# Patient Record
Sex: Female | Born: 1980
Health system: Southern US, Community
[De-identification: ages and names within clinical notes are randomized; demographics above are authoritative.]

## PROBLEM LIST (undated history)

## (undated) DIAGNOSIS — R7303 Prediabetes: Secondary | ICD-10-CM

## (undated) DIAGNOSIS — K37 Unspecified appendicitis: Secondary | ICD-10-CM

## (undated) DIAGNOSIS — Z789 Other specified health status: Secondary | ICD-10-CM

## (undated) DIAGNOSIS — E785 Hyperlipidemia, unspecified: Secondary | ICD-10-CM

## (undated) HISTORY — PX: OTHER SURGICAL HISTORY: SHX169

## (undated) HISTORY — DX: Hyperlipidemia, unspecified: E78.5

## (undated) HISTORY — DX: Prediabetes: R73.03

## (undated) HISTORY — DX: Other specified health status: Z78.9

---

## 2013-03-14 ENCOUNTER — Emergency Department (INDEPENDENT_AMBULATORY_CARE_PROVIDER_SITE_OTHER)
Admission: EM | Admit: 2013-03-14 | Discharge: 2013-03-14 | Disposition: A | Discharge status: Home / Self Care | Payer: No Typology Code available for payment source | Source: Home / Self Care | Attending: Family Medicine | Admitting: Family Medicine

## 2013-03-14 ENCOUNTER — Encounter (HOSPITAL_COMMUNITY): Payer: Self-pay | Admitting: Emergency Medicine

## 2013-03-14 DIAGNOSIS — K219 Gastro-esophageal reflux disease without esophagitis: Secondary | ICD-10-CM

## 2013-03-14 LAB — POCT RAPID STREP A: Streptococcus, Group A Screen (Direct): NEGATIVE

## 2013-03-14 MED ORDER — GI COCKTAIL ~~LOC~~
ORAL | Status: AC
  Filled 2013-03-14: qty 30

## 2013-03-14 MED ORDER — RANITIDINE HCL 150 MG PO TABS: 150.0000 mg | ORAL_TABLET | Freq: Two times a day (BID) | ORAL | Status: DC

## 2013-03-14 MED ORDER — GI COCKTAIL ~~LOC~~
30.0000 mL | Freq: Once | ORAL | Status: AC
  Administered 2013-03-14: 30 mL via ORAL

## 2013-03-14 NOTE — ED Notes (Signed)
Pt c/o ST onset 4 days... sxs include odynophagia... Denies: fevers and cold sxs... She is alert w/no signs of acute distress.

## 2013-03-14 NOTE — ED Provider Notes (Signed)
   History    CSN: [id number] Arrival date & time 03/14/13  1551  First MD Initiated Contact with Patient 03/14/13 1615     Chief Complaint  Patient presents with  . Sore Throat   (Consider location/radiation/quality/duration/timing/severity/associated sxs/prior Treatment) Patient is a 32 y.o. female presenting with pharyngitis. The history is provided by the patient and a friend. The history is limited by a language barrier. Language interpreter used: friend translated.  Sore Throat This is a new problem. The current episode started more than 2 days ago. The problem has been gradually worsening. Pertinent negatives include no chest pain and no abdominal pain. The symptoms are aggravated by swallowing.   History reviewed. No pertinent past medical history. History reviewed. No pertinent past surgical history. No family history on file. History  Substance Use Topics  . Smoking status: Never Smoker   . Smokeless tobacco: Not on file  . Alcohol Use: No   OB History   Grav Para Term Preterm Abortions TAB SAB Ect Mult Living                 Review of Systems  Constitutional: Negative.  Negative for fever.  HENT: Negative.   Respiratory: Negative.   Cardiovascular: Negative for chest pain.  Gastrointestinal: Negative for nausea, vomiting, abdominal pain and diarrhea.    Allergies  Review of patient's allergies indicates no known allergies.  Home Medications   Current Outpatient Rx  Name  Route  Sig  Dispense  Refill  . ranitidine (ZANTAC) 150 MG tablet   Oral   Take 1 tablet (150 mg total) by mouth 2 (two) times daily.   60 tablet   0    There were no vitals taken for this visit. Physical Exam  Nursing note and vitals reviewed. Constitutional: She is oriented to person, place, and time. She appears well-developed and well-nourished.  HENT:  Head: Normocephalic.  Right Ear: External ear normal.  Left Ear: External ear normal.  Mouth/Throat: Oropharynx is clear and  moist.  Eyes: Conjunctivae are normal. Pupils are equal, round, and reactive to light.  Neck: Normal range of motion. Neck supple. No thyromegaly present.  Cardiovascular: Normal rate, regular rhythm, normal heart sounds and intact distal pulses.   Pulmonary/Chest: Breath sounds normal.  Lymphadenopathy:    She has no cervical adenopathy.  Neurological: She is alert and oriented to person, place, and time.  Skin: Skin is warm and dry.    ED Course  Procedures (including critical care time) Labs Reviewed  CULTURE, GROUP A STREP  POCT RAPID STREP A ([facility] ONLY)   No results found. 1. GERD (gastroesophageal reflux disease)     MDM  Strep neg Sx improved with gi cocktail  Linna Hoff, MD 03/16/13 [phone]

## 2014-04-20 ENCOUNTER — Encounter (HOSPITAL_COMMUNITY): Payer: Medicaid Other | Admitting: Certified Registered Nurse Anesthetist

## 2014-04-20 ENCOUNTER — Emergency Department (HOSPITAL_COMMUNITY): Payer: Medicaid Other

## 2014-04-20 ENCOUNTER — Emergency Department (HOSPITAL_COMMUNITY): Payer: Medicaid Other | Admitting: Certified Registered Nurse Anesthetist

## 2014-04-20 ENCOUNTER — Encounter (HOSPITAL_COMMUNITY): Payer: Self-pay | Admitting: Emergency Medicine

## 2014-04-20 ENCOUNTER — Inpatient Hospital Stay (HOSPITAL_COMMUNITY)
Admission: EM | Admit: 2014-04-20 | Discharge: 2014-04-24 | DRG: 854 | Disposition: A | Discharge status: Home / Self Care | Payer: Medicaid Other | Attending: General Surgery | Admitting: General Surgery

## 2014-04-20 ENCOUNTER — Encounter (HOSPITAL_COMMUNITY): Admission: EM | Disposition: A | Discharge status: Home / Self Care | Payer: Self-pay | Source: Home / Self Care

## 2014-04-20 DIAGNOSIS — R6520 Severe sepsis without septic shock: Secondary | ICD-10-CM

## 2014-04-20 DIAGNOSIS — K37 Unspecified appendicitis: Secondary | ICD-10-CM | POA: Diagnosis present

## 2014-04-20 DIAGNOSIS — E876 Hypokalemia: Secondary | ICD-10-CM | POA: Diagnosis not present

## 2014-04-20 DIAGNOSIS — K3580 Unspecified acute appendicitis: Secondary | ICD-10-CM | POA: Diagnosis present

## 2014-04-20 DIAGNOSIS — E871 Hypo-osmolality and hyponatremia: Secondary | ICD-10-CM | POA: Diagnosis present

## 2014-04-20 DIAGNOSIS — R339 Retention of urine, unspecified: Secondary | ICD-10-CM | POA: Diagnosis not present

## 2014-04-20 DIAGNOSIS — R7309 Other abnormal glucose: Secondary | ICD-10-CM | POA: Diagnosis present

## 2014-04-20 DIAGNOSIS — R0902 Hypoxemia: Secondary | ICD-10-CM | POA: Diagnosis present

## 2014-04-20 DIAGNOSIS — J90 Pleural effusion, not elsewhere classified: Secondary | ICD-10-CM

## 2014-04-20 DIAGNOSIS — A4151 Sepsis due to Escherichia coli [E. coli]: Principal | ICD-10-CM | POA: Diagnosis present

## 2014-04-20 DIAGNOSIS — J9819 Other pulmonary collapse: Secondary | ICD-10-CM | POA: Diagnosis not present

## 2014-04-20 DIAGNOSIS — A419 Sepsis, unspecified organism: Secondary | ICD-10-CM | POA: Diagnosis present

## 2014-04-20 DIAGNOSIS — IMO0002 Reserved for concepts with insufficient information to code with codable children: Secondary | ICD-10-CM

## 2014-04-20 DIAGNOSIS — D509 Iron deficiency anemia, unspecified: Secondary | ICD-10-CM | POA: Diagnosis present

## 2014-04-20 HISTORY — PX: LAPAROSCOPIC APPENDECTOMY: SHX408

## 2014-04-20 HISTORY — DX: Unspecified appendicitis: K37

## 2014-04-20 LAB — COMPREHENSIVE METABOLIC PANEL
ALBUMIN: 2.9 g/dL — AB (ref 3.5–5.2)
ALK PHOS: 187 U/L — AB (ref 39–117)
ALT: 36 U/L — ABNORMAL HIGH (ref 0–35)
ANION GAP: 18 — AB (ref 5–15)
AST: 37 U/L (ref 0–37)
BUN: 10 mg/dL (ref 6–23)
CO2: 16 mEq/L — ABNORMAL LOW (ref 19–32)
CREATININE: 0.54 mg/dL (ref 0.50–1.10)
Calcium: 8.1 mg/dL — ABNORMAL LOW (ref 8.4–10.5)
Chloride: 99 mEq/L (ref 96–112)
GFR calc Af Amer: >90 mL/min (ref >90)
GFR calc non Af Amer: >90 mL/min (ref >90)
Glucose, Bld: 91 mg/dL (ref 70–99)
POTASSIUM: 3.2 meq/L — AB (ref 3.7–5.3)
Sodium: 133 mEq/L — ABNORMAL LOW (ref 137–147)
TOTAL PROTEIN: 6.7 g/dL (ref 6.0–8.3)
Total Bilirubin: 0.9 mg/dL (ref 0.3–1.2)

## 2014-04-20 LAB — URINALYSIS, ROUTINE W REFLEX MICROSCOPIC
Bilirubin Urine: NEGATIVE
Glucose, UA: NEGATIVE mg/dL
Hgb urine dipstick: NEGATIVE
Ketones, ur: NEGATIVE mg/dL
Leukocytes, UA: NEGATIVE
NITRITE: NEGATIVE
PH: 5 (ref 5.0–8.0)
Protein, ur: NEGATIVE mg/dL
SPECIFIC GRAVITY, URINE: 1.015 (ref 1.005–1.030)
UROBILINOGEN UA: 0.2 mg/dL (ref 0.0–1.0)

## 2014-04-20 LAB — CBC
HEMATOCRIT: 27.8 % — AB (ref 36.0–46.0)
Hemoglobin: 8.9 g/dL — ABNORMAL LOW (ref 12.0–15.0)
MCH: 19.9 pg — ABNORMAL LOW (ref 26.0–34.0)
MCHC: 32 g/dL (ref 30.0–36.0)
MCV: 62.2 fL — ABNORMAL LOW (ref 78.0–100.0)
PLATELETS: 228 10*3/uL (ref 150–400)
RBC: 4.47 MIL/uL (ref 3.87–5.11)
RDW: 17.7 % — AB (ref 11.5–15.5)
WBC: 7.3 10*3/uL (ref 4.0–10.5)

## 2014-04-20 LAB — I-STAT CG4 LACTIC ACID, ED: LACTIC ACID, VENOUS: 3.66 mmol/L — AB (ref 0.5–2.2)

## 2014-04-20 LAB — LIPASE, BLOOD: LIPASE: 18 U/L (ref 11–59)

## 2014-04-20 SURGERY — APPENDECTOMY, LAPAROSCOPIC
Anesthesia: General | Site: Abdomen

## 2014-04-20 MED ORDER — BUPIVACAINE-EPINEPHRINE 0.25% -1:200000 IJ SOLN
INTRAMUSCULAR | Status: AC
  Filled 2014-04-20: qty 1

## 2014-04-20 MED ORDER — PROPOFOL 10 MG/ML IV BOLUS
INTRAVENOUS | Status: DC | PRN
  Administered 2014-04-20: 110 mg via INTRAVENOUS

## 2014-04-20 MED ORDER — PHENYLEPHRINE HCL 10 MG/ML IJ SOLN
10.0000 mg | INTRAVENOUS | Status: DC | PRN
  Administered 2014-04-20: 25 ug/min via INTRAVENOUS

## 2014-04-20 MED ORDER — LIDOCAINE HCL (CARDIAC) 20 MG/ML IV SOLN
INTRAVENOUS | Status: DC | PRN
  Administered 2014-04-20: 100 mg via INTRAVENOUS

## 2014-04-20 MED ORDER — ONDANSETRON HCL 4 MG/2ML IJ SOLN
4.0000 mg | Freq: Once | INTRAMUSCULAR | Status: AC
  Administered 2014-04-20: 4 mg via INTRAVENOUS
  Filled 2014-04-20: qty 2

## 2014-04-20 MED ORDER — DEXAMETHASONE SODIUM PHOSPHATE 10 MG/ML IJ SOLN
INTRAMUSCULAR | Status: DC | PRN
  Administered 2014-04-20: 10 mg via INTRAVENOUS

## 2014-04-20 MED ORDER — PROPOFOL 10 MG/ML IV BOLUS
INTRAVENOUS | Status: AC
  Filled 2014-04-20: qty 20

## 2014-04-20 MED ORDER — NEOSTIGMINE METHYLSULFATE 10 MG/10ML IV SOLN
INTRAVENOUS | Status: AC
  Filled 2014-04-20: qty 1

## 2014-04-20 MED ORDER — ONDANSETRON HCL 4 MG/2ML IJ SOLN
INTRAMUSCULAR | Status: AC
  Filled 2014-04-20: qty 2

## 2014-04-20 MED ORDER — SODIUM CHLORIDE 0.9 % IV BOLUS (SEPSIS)
1000.0000 mL | Freq: Once | INTRAVENOUS | Status: AC
  Administered 2014-04-20: 1000 mL via INTRAVENOUS

## 2014-04-20 MED ORDER — IOHEXOL 300 MG/ML  SOLN
50.0000 mL | Freq: Once | INTRAMUSCULAR | Status: AC | PRN
  Administered 2014-04-20: 50 mL via ORAL

## 2014-04-20 MED ORDER — LIDOCAINE HCL (CARDIAC) 20 MG/ML IV SOLN
INTRAVENOUS | Status: AC
  Filled 2014-04-20: qty 5

## 2014-04-20 MED ORDER — ROCURONIUM BROMIDE 100 MG/10ML IV SOLN
INTRAVENOUS | Status: AC
  Filled 2014-04-20: qty 1

## 2014-04-20 MED ORDER — VANCOMYCIN HCL IN DEXTROSE 1-5 GM/200ML-% IV SOLN
1000.0000 mg | Freq: Once | INTRAVENOUS | Status: AC
  Administered 2014-04-20: 1000 mg via INTRAVENOUS
  Filled 2014-04-20: qty 200

## 2014-04-20 MED ORDER — ROCURONIUM BROMIDE 100 MG/10ML IV SOLN
INTRAVENOUS | Status: DC | PRN
  Administered 2014-04-20: 20 mg via INTRAVENOUS

## 2014-04-20 MED ORDER — PIPERACILLIN-TAZOBACTAM 3.375 G IVPB 30 MIN
3.3750 g | Freq: Once | INTRAVENOUS | Status: AC
  Administered 2014-04-20: 3.375 g via INTRAVENOUS
  Filled 2014-04-20: qty 50

## 2014-04-20 MED ORDER — SUCCINYLCHOLINE CHLORIDE 20 MG/ML IJ SOLN
INTRAMUSCULAR | Status: DC | PRN
  Administered 2014-04-20: 100 mg via INTRAVENOUS

## 2014-04-20 MED ORDER — MIDAZOLAM HCL 2 MG/2ML IJ SOLN
INTRAMUSCULAR | Status: AC
  Filled 2014-04-20: qty 2

## 2014-04-20 MED ORDER — ACETAMINOPHEN 500 MG PO TABS
1000.0000 mg | ORAL_TABLET | Freq: Once | ORAL | Status: AC
  Administered 2014-04-20: 1000 mg via ORAL
  Filled 2014-04-20: qty 2

## 2014-04-20 MED ORDER — DEXAMETHASONE SODIUM PHOSPHATE 10 MG/ML IJ SOLN
INTRAMUSCULAR | Status: AC
  Filled 2014-04-20: qty 1

## 2014-04-20 MED ORDER — MIDAZOLAM HCL 5 MG/5ML IJ SOLN
INTRAMUSCULAR | Status: DC | PRN
  Administered 2014-04-20: 1 mg via INTRAVENOUS

## 2014-04-20 MED ORDER — BUPIVACAINE-EPINEPHRINE 0.5% -1:200000 IJ SOLN
INTRAMUSCULAR | Status: AC
  Filled 2014-04-20: qty 1

## 2014-04-20 MED ORDER — BUPIVACAINE-EPINEPHRINE 0.5% -1:200000 IJ SOLN
INTRAMUSCULAR | Status: DC | PRN
  Administered 2014-04-20: 8 mL

## 2014-04-20 MED ORDER — FENTANYL CITRATE 0.05 MG/ML IJ SOLN
INTRAMUSCULAR | Status: AC
  Filled 2014-04-20: qty 5

## 2014-04-20 MED ORDER — PHENYLEPHRINE HCL 10 MG/ML IJ SOLN
INTRAMUSCULAR | Status: AC
  Filled 2014-04-20: qty 1

## 2014-04-20 MED ORDER — PHENYLEPHRINE 40 MCG/ML (10ML) SYRINGE FOR IV PUSH (FOR BLOOD PRESSURE SUPPORT)
PREFILLED_SYRINGE | INTRAVENOUS | Status: AC
  Filled 2014-04-20: qty 10

## 2014-04-20 MED ORDER — IOHEXOL 300 MG/ML  SOLN
80.0000 mL | Freq: Once | INTRAMUSCULAR | Status: AC | PRN
  Administered 2014-04-20: 80 mL via INTRAVENOUS

## 2014-04-20 MED ORDER — GLYCOPYRROLATE 0.2 MG/ML IJ SOLN
INTRAMUSCULAR | Status: AC
  Filled 2014-04-20: qty 3

## 2014-04-20 MED ORDER — FENTANYL CITRATE 0.05 MG/ML IJ SOLN
INTRAMUSCULAR | Status: DC | PRN
  Administered 2014-04-20: 25 ug via INTRAVENOUS
  Administered 2014-04-20: 50 ug via INTRAVENOUS
  Administered 2014-04-21: 25 ug via INTRAVENOUS

## 2014-04-20 MED ORDER — LACTATED RINGERS IV SOLN
INTRAVENOUS | Status: DC | PRN
  Administered 2014-04-20 (×2): via INTRAVENOUS

## 2014-04-20 SURGICAL SUPPLY — 36 items
APPLIER CLIP 5 13 M/L LIGAMAX5 (MISCELLANEOUS)
APPLIER CLIP ROT 10 11.4 M/L (STAPLE)
CABLE HI FREQUENCY MONOPOLAR (ELECTROSURGICAL) IMPLANT
CANISTER SUCTION 2500CC (MISCELLANEOUS) ×3 IMPLANT
CHLORAPREP W/TINT 26ML (MISCELLANEOUS) ×3 IMPLANT
CLIP APPLIE 5 13 M/L LIGAMAX5 (MISCELLANEOUS) IMPLANT
CLIP APPLIE ROT 10 11.4 M/L (STAPLE) IMPLANT
CUTTER FLEX LINEAR 45M (STAPLE) ×3 IMPLANT
DECANTER SPIKE VIAL GLASS SM (MISCELLANEOUS) ×3 IMPLANT
DERMABOND ADVANCED (GAUZE/BANDAGES/DRESSINGS) ×2
DERMABOND ADVANCED .7 DNX12 (GAUZE/BANDAGES/DRESSINGS) ×1 IMPLANT
DRAPE LAPAROSCOPIC ABDOMINAL (DRAPES) ×3 IMPLANT
DRAPE UTILITY XL STRL (DRAPES) ×3 IMPLANT
ELECT REM PT RETURN 9FT ADLT (ELECTROSURGICAL) ×3
ELECTRODE REM PT RTRN 9FT ADLT (ELECTROSURGICAL) ×1 IMPLANT
GLOVE BIO SURGEON STRL SZ 6.5 (GLOVE) ×2 IMPLANT
GLOVE BIO SURGEONS STRL SZ 6.5 (GLOVE) ×1
GLOVE BIOGEL PI IND STRL 7.0 (GLOVE) ×1 IMPLANT
GLOVE BIOGEL PI INDICATOR 7.0 (GLOVE) ×2
GOWN SPEC L4 XLG W/TWL (GOWN DISPOSABLE) ×6 IMPLANT
KIT BASIN OR (CUSTOM PROCEDURE TRAY) ×3 IMPLANT
POUCH SPECIMEN RETRIEVAL 10MM (ENDOMECHANICALS) ×3 IMPLANT
RELOAD 45 VASCULAR/THIN (ENDOMECHANICALS) ×3 IMPLANT
RELOAD STAPLE TA45 3.5 REG BLU (ENDOMECHANICALS) ×3 IMPLANT
SET IRRIG TUBING LAPAROSCOPIC (IRRIGATION / IRRIGATOR) ×3 IMPLANT
SHEARS HARMONIC ACE PLUS 36CM (ENDOMECHANICALS) IMPLANT
SOLUTION ANTI FOG 6CC (MISCELLANEOUS) ×3 IMPLANT
SUT VIC AB 4-0 PS2 27 (SUTURE) ×3 IMPLANT
TOWEL OR 17X26 10 PK STRL BLUE (TOWEL DISPOSABLE) ×3 IMPLANT
TOWEL OR NON WOVEN STRL DISP B (DISPOSABLE) ×3 IMPLANT
TRAY FOLEY CATH 14FRSI W/METER (CATHETERS) ×3 IMPLANT
TRAY LAP CHOLE (CUSTOM PROCEDURE TRAY) ×3 IMPLANT
TROCAR BLADELESS OPT 5 75 (ENDOMECHANICALS) ×6 IMPLANT
TROCAR XCEL 12X100 BLDLESS (ENDOMECHANICALS) IMPLANT
TROCAR XCEL BLUNT TIP 100MML (ENDOMECHANICALS) IMPLANT
TUBING INSUFFLATION 10FT LAP (TUBING) ×3 IMPLANT

## 2014-04-20 NOTE — ED Notes (Signed)
Bed: [id number] Expected date:  Expected time:  Means of arrival:  Comments: EMS tachy

## 2014-04-20 NOTE — Op Note (Signed)
Tiffany [name] [id number]   PRE-OPERATIVE DIAGNOSIS:  Acute appendicitis  POST-OPERATIVE DIAGNOSIS:  Acute appendicitis   Procedure(s): APPENDECTOMY LAPAROSCOPIC  SURGEON:  Surgeon(s): Romie LeveeAlicia Tylesha Gibeault, MD  ASSISTANT: none   ANESTHESIA:   local and general  EBL:   25ml  Delay start of Pharmacological VTE agent (>24hrs) due to surgical blood loss or risk of bleeding:  no  DRAINS: none   SPECIMEN:  Source of Specimen:  appendix  DISPOSITION OF SPECIMEN:  PATHOLOGY  COUNTS:  YES  PLAN OF CARE: Admit for overnight observation  PATIENT DISPOSITION:  PACU - hemodynamically stable.   INDICATIONS: Patient with concerning symptoms & work up suspicious for appendicitis.  Surgery was recommended:  The anatomy & physiology of the digestive tract was discussed.  The pathophysiology of appendicitis was discussed.  Natural history risks without surgery was discussed.   I feel the risks of no intervention will lead to serious problems that outweigh the operative risks; therefore, I recommended diagnostic laparoscopy with removal of appendix to remove the pathology.  Laparoscopic & open techniques were discussed.   I noted a good likelihood this will help address the problem.    Risks such as bleeding, infection, abscess, leak, reoperation, possible ostomy, hernia, heart attack, death, and other risks were discussed.  Goals of post-operative recovery were discussed as well.  We will work to minimize complications.  Questions were answered.  The patient expresses understanding & wishes to proceed with surgery.  OR FINDINGS: acute appendicitis with slightly gangrenous tip, no signs of perforation  DESCRIPTION:   The patient was identified & brought into the operating room. The patient was positioned supine with left arm tucked. SCDs were active during the entire case. The patient underwent general anesthesia without any difficulty.  A foley catheter was inserted under sterile conditions. The abdomen  was prepped and draped in a sterile fashion. A Surgical Timeout confirmed our plan.   I made a transverse incision through the superior umbilical fold.  I made a nick in the infraumbilical fascia and confirmed peritoneal entry.  I placed a stay suture and then the [facility].  We induced carbon dioxide insufflation.  Camera inspection revealed no injury.  I placed additional ports under direct laparoscopic visualization.  I mobilized the terminal ileum to proximal ascending colon in a lateral to medial fashion.  I took care to avoid injuring any retroperitoneal structures.   I freed the appendix off its attachments to the ascending colon and cecal mesentery.  I elevated the appendix. I skeletonized & ligated the mesoappendix with a white load stapler. I was able to free off the base of the appendix which was still viable.  I stapled the appendix off the cecum using a laparoscopic stapler blue load. I took a healthy cuff viable cecum.  I placed the appendix inside an EndoCatch bag and removed out the [facility] port.  I did copious irrigation. Hemostasis was good in the mesoappendix, colon mesentery, and retroperitoneum. Staple line was intact on the cecum with no bleeding. I washed out the pelvis, retrohepatic space and right paracolic gutter. I washed out the left side as well.  Hemostasis is good. There was no perforation or injury.  Because the area cleaned up well after irrigation, I did not place a drain.  I aspirated the carbon dioxide. I removed the ports. I closed the umbilical fascia site using a 0 Vicryl stitch. I closed skin using 4-0 vicryl stitch.  Sterile dressings were applied.  Patient was extubated and sent  to the recovery room.  I discussed the operative findings with the patient's  family. I suspect the patient is going used in the hospital at least overnight and will need antibiotics for 0 days. Questions answered. They expressed understanding and appreciation.

## 2014-04-20 NOTE — ED Notes (Signed)
Patient transported to CT 

## 2014-04-20 NOTE — Anesthesia Preprocedure Evaluation (Signed)
Anesthesia Evaluation  Patient identified by MRN, date of birth, ID band Patient awake    Reviewed: Allergy & Precautions, H&P , NPO status , Patient's Chart, lab work & pertinent test results  Airway Mallampati: II  TM Distance: >3 FB Neck ROM: Full    Dental no notable dental hx.    Pulmonary neg pulmonary ROS,  breath sounds clear to auscultation  Pulmonary exam normal       Cardiovascular negative cardio ROS  Rhythm:Regular Rate:Normal     Neuro/Psych negative neurological ROS  negative psych ROS   GI/Hepatic negative GI ROS, Neg liver ROS,   Endo/Other  negative endocrine ROS  Renal/GU negative Renal ROS  negative genitourinary   Musculoskeletal negative musculoskeletal ROS (+)   Abdominal   Peds negative pediatric ROS (+)  Hematology negative hematology ROS (+)   Anesthesia Other Findings   Reproductive/Obstetrics negative OB ROS                             Anesthesia Physical Anesthesia Plan  ASA: I and emergent  Anesthesia Plan: General   Post-op Pain Management:    Induction: Intravenous  Airway Management Planned: Oral ETT  Additional Equipment:   Intra-op Plan:   Post-operative Plan: Extubation in OR  Informed Consent: I have reviewed the patients History and Physical, chart, labs and discussed the procedure including the risks, benefits and alternatives for the proposed anesthesia with the patient or authorized representative who has indicated his/her understanding and acceptance.   Dental advisory given  Plan Discussed with: CRNA  Anesthesia Plan Comments:         Anesthesia Quick Evaluation  

## 2014-04-20 NOTE — ED Notes (Addendum)
Per EMS, pt has had n/v/fever for the past 2 days. Pt has been tachycardic en route to hospital. Pt last took tylenol at 3PM. Pt speaks karen and some english.

## 2014-04-20 NOTE — ED Provider Notes (Signed)
CSN: [id number]     Arrival date & time 04/20/14  1814 History   First MD Initiated Contact with Patient 04/20/14 1832     Chief Complaint  Patient presents with  . Tachycardia  . Nausea  . Emesis  . Fever     (Consider location/radiation/quality/duration/timing/severity/associated sxs/prior Treatment) Patient is a 33 y.o. female presenting with vomiting and fever. The history is provided by the patient.  Emesis Severity:  Moderate Timing:  Constant Quality:  Stomach contents Progression:  Unchanged Chronicity:  New Relieved by:  Nothing Worsened by:  Nothing tried Ineffective treatments:  None tried Associated symptoms: abdominal pain   Associated symptoms: no chills and no diarrhea   Fever Associated symptoms: vomiting   Associated symptoms: no chest pain, no chills, no cough and no diarrhea     History reviewed. No pertinent past medical history. History reviewed. No pertinent past surgical history. No family history on file. History  Substance Use Topics  . Smoking status: Never Smoker   . Smokeless tobacco: Not on file  . Alcohol Use: No   OB History   Grav Para Term Preterm Abortions TAB SAB Ect Mult Living                 Review of Systems  Constitutional: Positive for fever. Negative for chills.  Respiratory: Negative for cough and shortness of breath.   Cardiovascular: Negative for chest pain and leg swelling.  Gastrointestinal: Positive for vomiting and abdominal pain. Negative for diarrhea.  All other systems reviewed and are negative.     Allergies  Review of patient's allergies indicates no known allergies.  Home Medications   Prior to Admission medications   Medication Sig Start Date End Date Taking? Authorizing Provider  acetaminophen (TYLENOL) 500 MG tablet Take 500 mg by mouth every 6 (six) hours as needed for mild pain, fever or headache.   Yes Historical Provider, MD   BP 81/49  Pulse 130  Temp(Src) 98.3 F (36.8 C) (Oral)  Resp 37   Ht 4\' 9"  (1.448 m)  Wt 90 lb (40.824 kg)  BMI 19.47 kg/m2  SpO2 98%  LMP 04/08/2014 Physical Exam  Nursing note and vitals reviewed. Constitutional: She is oriented to person, place, and time. She appears well-developed and well-nourished. She appears distressed.  HENT:  Head: Normocephalic and atraumatic.  Mouth/Throat: Oropharynx is clear and moist.  Eyes: EOM are normal. Pupils are equal, round, and reactive to light.  Neck: Normal range of motion. Neck supple.  Cardiovascular: Regular rhythm.  Tachycardia present.  Exam reveals no friction rub.   No murmur heard. Pulmonary/Chest: Effort normal and breath sounds normal. No respiratory distress. She has no wheezes. She has no rales.  Abdominal: Soft. She exhibits no distension. There is tenderness (R flank, RLQ). There is no rebound.  Musculoskeletal: Normal range of motion. She exhibits no edema.  Neurological: She is alert and oriented to person, place, and time.  Skin: No rash noted. She is not diaphoretic.    ED Course  Procedures (including critical care time) Labs Review Labs Reviewed  CBC - Abnormal; Notable for the following:    Hemoglobin 8.9 (*)    HCT 27.8 (*)    MCV 62.2 (*)    MCH 19.9 (*)    RDW 17.7 (*)    All other components within normal limits  COMPREHENSIVE METABOLIC PANEL - Abnormal; Notable for the following:    Sodium 133 (*)    Potassium 3.2 (*)    CO2  16 (*)    Calcium 8.1 (*)    Albumin 2.9 (*)    ALT 36 (*)    Alkaline Phosphatase 187 (*)    Anion gap 18 (*)    All other components within normal limits  URINALYSIS, ROUTINE W REFLEX MICROSCOPIC - Abnormal; Notable for the following:    APPearance CLOUDY (*)    All other components within normal limits  I-STAT CG4 LACTIC ACID, ED - Abnormal; Notable for the following:    Lactic Acid, Venous 3.66 (*)    All other components within normal limits  URINE CULTURE  CULTURE, BLOOD (ROUTINE X 2)  CULTURE, BLOOD (ROUTINE X 2)  LIPASE, BLOOD     Imaging Review Ct Abdomen Pelvis W Contrast  04/20/2014   CLINICAL DATA:  Right-sided abdominal pain.  EXAM: CT ABDOMEN AND PELVIS WITH CONTRAST  TECHNIQUE: Multidetector CT imaging of the abdomen and pelvis was performed using the standard protocol following bolus administration of intravenous contrast.  CONTRAST:  80mL OMNIPAQUE IOHEXOL 300 MG/ML SOLN, 50mL OMNIPAQUE IOHEXOL 300 MG/ML SOLN  COMPARISON:  None.  FINDINGS: Lung bases:  Patchy areas of subsegmental atelectasis. No pleural effusion. The heart is normal in size. No pericardial effusion the distal esophagus is grossly normal.  CT abdomen:  The liver is unremarkable. No focal lesions or biliary dilatation. The gallbladder is grossly normal despite motion artifact. No common bile duct dilatation. The pancreas is grossly normal. The spleen is normal in size. No focal lesions. The adrenal glands and kidneys are unremarkable despite motion artifact.  The stomach, duodenum, small bowel and colon are grossly normal. The appendix is distended and demonstrates significant inflammation with mucosal enhancement. I do not see any surrounding fluid collections to suggest perforation. Surrounding enlarged lymph nodes are likely inflamed. The terminal ileum is unremarkable. There is mild associated inflammation involving the cecum. No mesenteric or retroperitoneal mass or adenopathy. The aorta and branch vessels are normal. The major venous structures are patent.  CT pelvis:  The uterus is abnormal. There is diffuse low attenuation surrounding the endometrium in the fundal region. Findings could be due to adenomyosis. Endometritis would be another possibility. Could not exclude the possibility of endometrial cancer but I think that would be unusual in a 33 year old. Recommend followup pelvic ultrasound (non urgent). Both ovaries are unremarkable except for multiple follicles. The bladder is normal. No pelvic mass, adenopathy or free pelvic fluid collections.  Moderately prominent parametrial vasculature. No inguinal mass or adenopathy.  Bony structures:  Unremarkable.  IMPRESSION: 1. Acute appendicitis without definite perforation. Severe surrounding inflammation and inflamed nodes. 2. Abnormal CT appearance of the uterus. Please see above discussion. Pelvic ultrasound recommended (non urgent). These results were called by telephone at the time of interpretation on 04/20/2014 at 9:06 pm to Dr. Elwin Mocha , who verbally acknowledged these results.   Electronically Signed   By: Loralie Champagne M.D.   On: 04/20/2014 21:06     EKG Interpretation   Date/Time:  Monday April 20 2014 18:32:12 EDT Ventricular Rate:  136 PR Interval:  128 QRS Duration: 73 QT Interval:  345 QTC Calculation: 519 R Axis:   74 Text Interpretation:  Sinus tachycardia Atrial premature complex  Borderline repolarization abnormality No prior for comparison Confirmed by  Gwendolyn Grant  MD, Nevan Creighton ([id number]) on 04/20/2014 6:59:54 PM     CRITICAL CARE Performed by: Dagmar Hait   Total critical care time: 40 minutes  Critical care time was exclusive of separately billable procedures and treating  other patients.  Critical care was necessary to treat or prevent imminent or life-threatening deterioration.  Critical care was time spent personally by me on the following activities: development of treatment plan with patient and/or surrogate as well as nursing, discussions with consultants, evaluation of patient's response to treatment, examination of patient, obtaining history from patient or surrogate, ordering and performing treatments and interventions, ordering and review of laboratory studies, ordering and review of radiographic studies, pulse oximetry and re-evaluation of patient's condition.  MDM   Final diagnoses:  Acute appendicitis, unspecified acute appendicitis type  Severe sepsis    33 year old female here with abdominal pain, fever vomiting, tachycardia. Began a  few days ago, persistently worsening. She is permeates and speaks the language of care and did use an interpreter phone for this. Denies any trouble urinating. Here febrile to 102.7, pulse 137. Initial BP 107/60. Since protocol followed with fluid boluses, antibiotics, labs. Labs show elevated lactate 3.66. Normal white count. Despite fluids and antipyretics, pressures remaining in the 80s and heart rate remaining in the 130s. CT shows appendicitis. Was given Zosyn previously which should cover intra-abdominal pathology. Surgery consulted and admitting.    Elwin MochaBlair Roslin Norwood, MD 04/20/14 [phone]

## 2014-04-20 NOTE — ED Notes (Signed)
Pt made aware of the need for urine.

## 2014-04-20 NOTE — H&P (Signed)
Tiffany [name] is an 33 y.o. female.   Chief Complaint: nausea, fever HPI: pt has had nausea and fever for the past 2 days. Denies sick contacts. Pt from [place] and speaks [place] and some [place].   History reviewed. No pertinent past medical history.  History reviewed. No pertinent past surgical history.  No family history on file. Social History:  reports that she has never smoked. She does not have any smokeless tobacco history on file. She reports that she does not drink alcohol or use illicit drugs.  Allergies: No Known Allergies   (Not in a hospital admission)  Results for orders placed during the hospital encounter of 04/20/14 (from the past 48 hour(s))  I-STAT CG4 LACTIC ACID, ED     Status: Abnormal   Collection Time    04/20/14  6:47 PM      Result Value Ref Range   Lactic Acid, Venous 3.66 (*) 0.5 - 2.2 mmol/L  CBC     Status: Abnormal   Collection Time    04/20/14  6:51 PM      Result Value Ref Range   WBC 7.3  4.0 - 10.5 K/uL   RBC 4.47  3.87 - 5.11 MIL/uL   Hemoglobin 8.9 (*) 12.0 - 15.0 g/dL   HCT 27.8 (*) 36.0 - 46.0 %   MCV 62.2 (*) 78.0 - 100.0 fL   MCH 19.9 (*) 26.0 - 34.0 pg   MCHC 32.0  30.0 - 36.0 g/dL   RDW 17.7 (*) 11.5 - 15.5 %   Platelets 228  150 - 400 K/uL  COMPREHENSIVE METABOLIC PANEL     Status: Abnormal   Collection Time    04/20/14  6:51 PM      Result Value Ref Range   Sodium 133 (*) 137 - 147 mEq/L   Potassium 3.2 (*) 3.7 - 5.3 mEq/L   Chloride 99  96 - 112 mEq/L   CO2 16 (*) 19 - 32 mEq/L   Glucose, Bld 91  70 - 99 mg/dL   BUN 10  6 - 23 mg/dL   Creatinine, Ser 0.54  0.50 - 1.10 mg/dL   Calcium 8.1 (*) 8.4 - 10.5 mg/dL   Total Protein 6.7  6.0 - 8.3 g/dL   Albumin 2.9 (*) 3.5 - 5.2 g/dL   AST 37  0 - 37 U/L   ALT 36 (*) 0 - 35 U/L   Alkaline Phosphatase 187 (*) 39 - 117 U/L   Total Bilirubin 0.9  0.3 - 1.2 mg/dL   GFR calc non Af Amer >90  >90 mL/min   GFR calc Af Amer >90  >90 mL/min   Comment: (NOTE)     The eGFR has been calculated  using the CKD EPI equation.     This calculation has not been validated in all clinical situations.     eGFR's persistently <90 mL/min signify possible Chronic Kidney     Disease.   Anion gap 18 (*) 5 - 15  LIPASE, BLOOD     Status: None   Collection Time    04/20/14  6:51 PM      Result Value Ref Range   Lipase 18  11 - 59 U/L  URINALYSIS, ROUTINE W REFLEX MICROSCOPIC     Status: Abnormal   Collection Time    04/20/14  7:33 PM      Result Value Ref Range   Color, Urine YELLOW  YELLOW   APPearance CLOUDY (*) CLEAR   Specific Gravity, Urine  1.015  1.005 - 1.030   pH 5.0  5.0 - 8.0   Glucose, UA NEGATIVE  NEGATIVE mg/dL   Hgb urine dipstick NEGATIVE  NEGATIVE   Bilirubin Urine NEGATIVE  NEGATIVE   Ketones, ur NEGATIVE  NEGATIVE mg/dL   Protein, ur NEGATIVE  NEGATIVE mg/dL   Urobilinogen, UA 0.2  0.0 - 1.0 mg/dL   Nitrite NEGATIVE  NEGATIVE   Leukocytes, UA NEGATIVE  NEGATIVE   Comment: MICROSCOPIC NOT DONE ON URINES WITH NEGATIVE PROTEIN, BLOOD, LEUKOCYTES, NITRITE, OR GLUCOSE <1000 mg/dL.   Ct Abdomen Pelvis W Contrast  04/20/2014   CLINICAL DATA:  Right-sided abdominal pain.  EXAM: CT ABDOMEN AND PELVIS WITH CONTRAST  TECHNIQUE: Multidetector CT imaging of the abdomen and pelvis was performed using the standard protocol following bolus administration of intravenous contrast.  CONTRAST:  56m OMNIPAQUE IOHEXOL 300 MG/ML SOLN, 516mOMNIPAQUE IOHEXOL 300 MG/ML SOLN  COMPARISON:  None.  FINDINGS: Lung bases:  Patchy areas of subsegmental atelectasis. No pleural effusion. The heart is normal in size. No pericardial effusion the distal esophagus is grossly normal.  CT abdomen:  The liver is unremarkable. No focal lesions or biliary dilatation. The gallbladder is grossly normal despite motion artifact. No common bile duct dilatation. The pancreas is grossly normal. The spleen is normal in size. No focal lesions. The adrenal glands and kidneys are unremarkable despite motion artifact.  The  stomach, duodenum, small bowel and colon are grossly normal. The appendix is distended and demonstrates significant inflammation with mucosal enhancement. I do not see any surrounding fluid collections to suggest perforation. Surrounding enlarged lymph nodes are likely inflamed. The terminal ileum is unremarkable. There is mild associated inflammation involving the cecum. No mesenteric or retroperitoneal mass or adenopathy. The aorta and branch vessels are normal. The major venous structures are patent.  CT pelvis:  The uterus is abnormal. There is diffuse low attenuation surrounding the endometrium in the fundal region. Findings could be due to adenomyosis. Endometritis would be another possibility. Could not exclude the possibility of endometrial cancer but I think that would be unusual in a [age]ear old. Recommend followup pelvic ultrasound (non urgent). Both ovaries are unremarkable except for multiple follicles. The bladder is normal. No pelvic mass, adenopathy or free pelvic fluid collections. Moderately prominent parametrial vasculature. No inguinal mass or adenopathy.  Bony structures:  Unremarkable.  IMPRESSION: 1. Acute appendicitis without definite perforation. Severe surrounding inflammation and inflamed nodes. 2. Abnormal CT appearance of the uterus. Please see above discussion. Pelvic ultrasound recommended (non urgent). These results were called by telephone at the time of interpretation on 04/20/2014 at 9:06 pm to Dr. BLEvelina Bucy who verbally acknowledged these results.   Electronically Signed   By: MaKalman Jewels.D.   On: 04/20/2014 21:06    Review of Systems  Constitutional: Positive for fever, chills and malaise/fatigue.  Eyes: Negative for blurred vision.  Respiratory: Negative for cough and shortness of breath.   Cardiovascular: Negative for chest pain.  Gastrointestinal: Positive for nausea and abdominal pain. Negative for vomiting, diarrhea and constipation.  Genitourinary:  Negative for dysuria.  Neurological: Negative for dizziness and headaches.    Blood pressure 88/56, pulse 133, temperature 98.3 F (36.8 C), temperature source Oral, resp. rate 25, height _0  (1.448 m), weight 90 lb (40.824 kg), last menstrual period 04/08/2014, SpO2 100.00%. Physical Exam  Constitutional: She is oriented to person, place, and time. She appears well-developed and well-nourished.  HENT:  Head: Normocephalic and atraumatic.  Eyes: Conjunctivae are  normal. Pupils are equal, round, and reactive to light.  Neck: Normal range of motion.  Cardiovascular: Regular rhythm.   Tachycardic  Respiratory: Effort normal and breath sounds normal.  GI: Soft. She exhibits distension. There is tenderness.  RLQ tenderness  Musculoskeletal: Normal range of motion.  Neurological: She is alert and oriented to person, place, and time.  Skin: Skin is warm and dry.     Assessment/Plan Tiffany [name] is a 33 y.o. F with acute appendicitis and early sepsis.  We discussed surgical resection through the use of a translator.  Risks of surgery include bleeding, infections, damage to adjacent structures and need for larger operation with open incision and/or ileocecal resection.  She indicated understanding of this.  All questions were answered.    Jarrin Staley C. 2/83/6629, 47:65 PM

## 2014-04-21 ENCOUNTER — Ambulatory Visit (HOSPITAL_COMMUNITY): Payer: Medicaid Other

## 2014-04-21 ENCOUNTER — Encounter (HOSPITAL_COMMUNITY): Payer: Self-pay

## 2014-04-21 ENCOUNTER — Inpatient Hospital Stay (HOSPITAL_COMMUNITY): Payer: Medicaid Other

## 2014-04-21 DIAGNOSIS — D509 Iron deficiency anemia, unspecified: Secondary | ICD-10-CM

## 2014-04-21 DIAGNOSIS — E876 Hypokalemia: Secondary | ICD-10-CM

## 2014-04-21 DIAGNOSIS — J9819 Other pulmonary collapse: Secondary | ICD-10-CM | POA: Diagnosis not present

## 2014-04-21 DIAGNOSIS — K3580 Unspecified acute appendicitis: Secondary | ICD-10-CM | POA: Diagnosis present

## 2014-04-21 DIAGNOSIS — R339 Retention of urine, unspecified: Secondary | ICD-10-CM | POA: Diagnosis not present

## 2014-04-21 DIAGNOSIS — A4151 Sepsis due to Escherichia coli [E. coli]: Secondary | ICD-10-CM | POA: Diagnosis present

## 2014-04-21 DIAGNOSIS — K37 Unspecified appendicitis: Secondary | ICD-10-CM | POA: Diagnosis present

## 2014-04-21 DIAGNOSIS — E871 Hypo-osmolality and hyponatremia: Secondary | ICD-10-CM | POA: Diagnosis present

## 2014-04-21 DIAGNOSIS — R0902 Hypoxemia: Secondary | ICD-10-CM | POA: Diagnosis present

## 2014-04-21 DIAGNOSIS — R509 Fever, unspecified: Secondary | ICD-10-CM | POA: Diagnosis present

## 2014-04-21 DIAGNOSIS — J90 Pleural effusion, not elsewhere classified: Secondary | ICD-10-CM | POA: Diagnosis present

## 2014-04-21 DIAGNOSIS — R7309 Other abnormal glucose: Secondary | ICD-10-CM | POA: Diagnosis present

## 2014-04-21 DIAGNOSIS — IMO0002 Reserved for concepts with insufficient information to code with codable children: Secondary | ICD-10-CM | POA: Diagnosis not present

## 2014-04-21 DIAGNOSIS — R6520 Severe sepsis without septic shock: Secondary | ICD-10-CM | POA: Diagnosis present

## 2014-04-21 LAB — FERRITIN: Ferritin: 55 ng/mL (ref 10–291)

## 2014-04-21 LAB — CBC
HEMATOCRIT: 26.6 % — AB (ref 36.0–46.0)
HEMOGLOBIN: 8.6 g/dL — AB (ref 12.0–15.0)
MCH: 20 pg — AB (ref 26.0–34.0)
MCHC: 32.3 g/dL (ref 30.0–36.0)
MCV: 61.9 fL — AB (ref 78.0–100.0)
Platelets: 190 10*3/uL (ref 150–400)
RBC: 4.3 MIL/uL (ref 3.87–5.11)
RDW: 18.3 % — AB (ref 11.5–15.5)
WBC: 22.1 10*3/uL — ABNORMAL HIGH (ref 4.0–10.5)

## 2014-04-21 LAB — BASIC METABOLIC PANEL WITH GFR
Anion gap: 15 (ref 5–15)
BUN: 10 mg/dL (ref 6–23)
CO2: 14 meq/L — ABNORMAL LOW (ref 19–32)
Calcium: 8.7 mg/dL (ref 8.4–10.5)
Chloride: 108 meq/L (ref 96–112)
Creatinine, Ser: 0.49 mg/dL — ABNORMAL LOW (ref 0.50–1.10)
GFR calc Af Amer: >90 mL/min
GFR calc non Af Amer: >90 mL/min
Glucose, Bld: 146 mg/dL — ABNORMAL HIGH (ref 70–99)
Potassium: 4.3 meq/L (ref 3.7–5.3)
Sodium: 137 meq/L (ref 137–147)

## 2014-04-21 LAB — IRON AND TIBC
Iron: <10 ug/dL — ABNORMAL LOW (ref 42–135)
UIBC: 340 ug/dL (ref 125–400)

## 2014-04-21 LAB — VITAMIN B12: Vitamin B-12: 1085 pg/mL — ABNORMAL HIGH (ref 211–911)

## 2014-04-21 LAB — RETICULOCYTES
RBC.: 4.3 MIL/uL (ref 3.87–5.11)
Retic Count, Absolute: 38.7 K/uL (ref 19.0–186.0)
Retic Ct Pct: 0.9 % (ref 0.4–3.1)

## 2014-04-21 LAB — MRSA PCR SCREENING: MRSA by PCR: NEGATIVE

## 2014-04-21 LAB — FOLATE: Folate: >20 ng/mL

## 2014-04-21 MED ORDER — MEPERIDINE HCL 50 MG/ML IJ SOLN: 6.2500 mg | INTRAMUSCULAR | Status: DC | PRN

## 2014-04-21 MED ORDER — POTASSIUM CHLORIDE 2 MEQ/ML IV SOLN
INTRAVENOUS | Status: DC
  Administered 2014-04-21: 09:00:00 via INTRAVENOUS
  Filled 2014-04-21 (×3): qty 1000

## 2014-04-21 MED ORDER — OXYCODONE-ACETAMINOPHEN 5-325 MG PO TABS
1.0000 | ORAL_TABLET | ORAL | Status: DC | PRN
  Administered 2014-04-23: 2 via ORAL
  Filled 2014-04-21: qty 2

## 2014-04-21 MED ORDER — ONDANSETRON HCL 4 MG PO TABS
4.0000 mg | ORAL_TABLET | Freq: Four times a day (QID) | ORAL | Status: DC | PRN
Start: 2014-04-21 — End: 2014-04-24

## 2014-04-21 MED ORDER — CHLORHEXIDINE GLUCONATE 0.12 % MT SOLN
15.0000 mL | Freq: Two times a day (BID) | OROMUCOSAL | Status: DC
  Administered 2014-04-22 – 2014-04-24 (×5): 15 mL via OROMUCOSAL
  Filled 2014-04-21 (×7): qty 15

## 2014-04-21 MED ORDER — ENOXAPARIN SODIUM 30 MG/0.3ML ~~LOC~~ SOLN
30.0000 mg | SUBCUTANEOUS | Status: DC
  Administered 2014-04-21 – 2014-04-23 (×3): 30 mg via SUBCUTANEOUS
  Filled 2014-04-21 (×4): qty 0.3

## 2014-04-21 MED ORDER — GLYCOPYRROLATE 0.2 MG/ML IJ SOLN
INTRAMUSCULAR | Status: DC | PRN
Start: 2014-04-21 — End: 2014-04-21
  Administered 2014-04-21: 0.6 mg via INTRAVENOUS

## 2014-04-21 MED ORDER — IOHEXOL 350 MG/ML SOLN
100.0000 mL | Freq: Once | INTRAVENOUS | Status: AC | PRN
Start: 2014-04-21 — End: 2014-04-21
  Administered 2014-04-21: 100 mL via INTRAVENOUS

## 2014-04-21 MED ORDER — PIPERACILLIN-TAZOBACTAM 3.375 G IVPB
3.3750 g | Freq: Three times a day (TID) | INTRAVENOUS | Status: DC
  Administered 2014-04-21 – 2014-04-24 (×9): 3.375 g via INTRAVENOUS
  Filled 2014-04-21 (×10): qty 50

## 2014-04-21 MED ORDER — NEOSTIGMINE METHYLSULFATE 10 MG/10ML IV SOLN
INTRAVENOUS | Status: DC | PRN
  Administered 2014-04-21: 5 mg via INTRAVENOUS

## 2014-04-21 MED ORDER — MORPHINE SULFATE 2 MG/ML IJ SOLN
2.0000 mg | INTRAMUSCULAR | Status: DC | PRN
  Administered 2014-04-21 – 2014-04-22 (×4): 2 mg via INTRAVENOUS
  Filled 2014-04-21 (×4): qty 1

## 2014-04-21 MED ORDER — LACTATED RINGERS IV SOLN: INTRAVENOUS | Status: DC

## 2014-04-21 MED ORDER — PROMETHAZINE HCL 25 MG/ML IJ SOLN: 6.2500 mg | INTRAMUSCULAR | Status: DC | PRN

## 2014-04-21 MED ORDER — ONDANSETRON HCL 4 MG/2ML IJ SOLN
INTRAMUSCULAR | Status: DC | PRN
  Administered 2014-04-21: 4 mg via INTRAVENOUS

## 2014-04-21 MED ORDER — CETYLPYRIDINIUM CHLORIDE 0.05 % MT LIQD
7.0000 mL | Freq: Two times a day (BID) | OROMUCOSAL | Status: DC
  Administered 2014-04-22 – 2014-04-23 (×3): 7 mL via OROMUCOSAL

## 2014-04-21 MED ORDER — IBUPROFEN 200 MG PO TABS: 600.0000 mg | ORAL_TABLET | Freq: Four times a day (QID) | ORAL | Status: DC | PRN

## 2014-04-21 MED ORDER — FENTANYL CITRATE 0.05 MG/ML IJ SOLN: 25.0000 ug | INTRAMUSCULAR | Status: DC | PRN

## 2014-04-21 MED ORDER — ONDANSETRON HCL 4 MG/2ML IJ SOLN: 4.0000 mg | Freq: Four times a day (QID) | INTRAMUSCULAR | Status: DC | PRN

## 2014-04-21 MED ORDER — SODIUM CHLORIDE 0.9 % IV SOLN
INTRAVENOUS | Status: DC
Start: 2014-04-21 — End: 2014-04-21
  Administered 2014-04-21: 03:00:00 via INTRAVENOUS

## 2014-04-21 NOTE — Progress Notes (Signed)
Her sister, who is bilingual was in the room and we discussed Ms. Dobosz situation and the plan.

## 2014-04-21 NOTE — Progress Notes (Signed)
Patient seen and examined.  She seems better overall this AM and feels better.  Her hemoglobin on admission was 8.9 and her uterus is abnormal on CT.  Those things will need further work up as an outpatient.

## 2014-04-21 NOTE — Progress Notes (Addendum)
CRITICAL VALUE ALERT  Critical value received:  Aerobic bottle , one set, gram negative rods  Date of notification:  04/21/2014  Time of notification:  1320   Critical value read back:  yes  Nurse who received alert:  Iran OuchStephanie Russell, RN/Ferrell Flam Morrie SheldonAshley, RN  MD notified (1st page):  Barnetta ChapelKelly Osborne, PA  Time of first page:  1330  MD notified (2nd page):  Time of second page:  Responding MD:  Barnetta ChapelKelly Osborne, PA  Time MD responded:  1340

## 2014-04-21 NOTE — Progress Notes (Signed)
CARE MANAGEMENT NOTE 04/21/2014  Patient:  Studzinski,Sharley   Account Number:  [id number]  Date Initiated:  04/21/2014  Documentation initiated by:  Fransico Sciandra  Subjective/Objective Assessment:   acute appendicitis with early sepsis     Action/Plan:   home when stable   Anticipated DC Date:  04/24/2014   Anticipated DC Plan:  HOME/SELF CARE  In-house referral  NA      DC Planning Services  NA      PAC Choice  NA   Choice offered to / List presented to:  NA   DME arranged  NA      DME agency  NA     HH arranged  NA      HH agency  NA   Status of service:  In process, will continue to follow Medicare Important Message given?  NA - LOS <3 / Initial given by admissions (If response is "NO", the following Medicare IM given date fields will be blank) Date Medicare IM given:   Medicare IM given by:   Date Additional Medicare IM given:   Additional Medicare IM given by:    Discharge Disposition:    Per UR Regulation:  Reviewed for med. necessity/level of care/duration of stay  If discussed at Long Length of Stay Meetings, dates discussed:    Comments:  Bjorn LoserRhonda Decarlo Rivet,RN,BSN,CCM

## 2014-04-21 NOTE — Progress Notes (Signed)
Respirations 30-45, HR 125-140.  SHOB with minimal movement in bed.  Lower groin and abdomen pain, sometimes intense.  Acute urinary retention; foley catheter place.  Skin damp and hot to touch.  Notified Dr. Abbey Chattersosenbower of patient's vital signs, pain and shob.  Also, notified him that CT result are available, which he has already read.  Will continue to monitor.

## 2014-04-21 NOTE — Progress Notes (Addendum)
Patient ID: Tiffany [name], female   DOB: April 01, 1981, 33 y.o.   MRN: [id number] 1 Day Post-Op  Subjective: Patient feels better this morning. She denies any nausea.  Objective: Vital signs in last 24 hours: Temp:  [97.6 F (36.4 C)-102.7 F (39.3 C)] 97.6 F (36.4 C) (08/18 0230) Pulse Rate:  [48-139] 104 (08/18 0700) Resp:  [13-39] 23 (08/18 0700) BP: (78-107)/(44-73) 97/66 mmHg (08/18 0700) SpO2:  [76 %-100 %] 96 % (08/18 0700) Weight:  [90 lb (40.824 kg)] 90 lb (40.824 kg) (08/17 1818) Last BM Date:  (Unknown)  Intake/Output from previous day: 08/17 0701 - 08/18 0700 In: 2945 [P.O.:600; I.V.:2345] Out: 755 [Urine:730; Blood:25] Intake/Output this shift:    PE: Abd: Soft, minimally tender, few bowel sounds, nondistended, incisions are clean, dry, and intact with Dermabond. Heart: Tachycardic, but regular Lungs: Clear to auscultation bilaterally  Lab Results:   Recent Labs  04/20/14 1851  WBC 7.3  HGB 8.9*  HCT 27.8*  PLT 228   BMET  Recent Labs  04/20/14 1851  NA 133*  K 3.2*  CL 99  CO2 16*  GLUCOSE 91  BUN 10  CREATININE 0.54  CALCIUM 8.1*   PT/INR No results found for this basename: LABPROT, INR,  in the last 72 hours CMP     Component Value Date/Time   NA 133* 04/20/2014 1851   K 3.2* 04/20/2014 1851   CL 99 04/20/2014 1851   CO2 16* 04/20/2014 1851   GLUCOSE 91 04/20/2014 1851   BUN 10 04/20/2014 1851   CREATININE 0.54 04/20/2014 1851   CALCIUM 8.1* 04/20/2014 1851   PROT 6.7 04/20/2014 1851   ALBUMIN 2.9* 04/20/2014 1851   AST 37 04/20/2014 1851   ALT 36* 04/20/2014 1851   ALKPHOS 187* 04/20/2014 1851   BILITOT 0.9 04/20/2014 1851   GFRNONAA >90 04/20/2014 1851   GFRAA >90 04/20/2014 1851   Lipase     Component Value Date/Time   LIPASE 18 04/20/2014 1851       Studies/Results: Ct Abdomen Pelvis W Contrast  04/20/2014   CLINICAL DATA:  Right-sided abdominal pain.  EXAM: CT ABDOMEN AND PELVIS WITH CONTRAST  TECHNIQUE: Multidetector CT imaging of the  abdomen and pelvis was performed using the standard protocol following bolus administration of intravenous contrast.  CONTRAST:  80mL OMNIPAQUE IOHEXOL 300 MG/ML SOLN, 50mL OMNIPAQUE IOHEXOL 300 MG/ML SOLN  COMPARISON:  None.  FINDINGS: Lung bases:  Patchy areas of subsegmental atelectasis. No pleural effusion. The heart is normal in size. No pericardial effusion the distal esophagus is grossly normal.  CT abdomen:  The liver is unremarkable. No focal lesions or biliary dilatation. The gallbladder is grossly normal despite motion artifact. No common bile duct dilatation. The pancreas is grossly normal. The spleen is normal in size. No focal lesions. The adrenal glands and kidneys are unremarkable despite motion artifact.  The stomach, duodenum, small bowel and colon are grossly normal. The appendix is distended and demonstrates significant inflammation with mucosal enhancement. I do not see any surrounding fluid collections to suggest perforation. Surrounding enlarged lymph nodes are likely inflamed. The terminal ileum is unremarkable. There is mild associated inflammation involving the cecum. No mesenteric or retroperitoneal mass or adenopathy. The aorta and branch vessels are normal. The major venous structures are patent.  CT pelvis:  The uterus is abnormal. There is diffuse low attenuation surrounding the endometrium in the fundal region. Findings could be due to adenomyosis. Endometritis would be another possibility. Could not exclude the possibility of  endometrial cancer but I think that would be unusual in a 34 year old. Recommend followup pelvic ultrasound (non urgent). Both ovaries are unremarkable except for multiple follicles. The bladder is normal. No pelvic mass, adenopathy or free pelvic fluid collections. Moderately prominent parametrial vasculature. No inguinal mass or adenopathy.  Bony structures:  Unremarkable.  IMPRESSION: 1. Acute appendicitis without definite perforation. Severe surrounding  inflammation and inflamed nodes. 2. Abnormal CT appearance of the uterus. Please see above discussion. Pelvic ultrasound recommended (non urgent). These results were called by telephone at the time of interpretation on 04/20/2014 at 9:06 pm to Dr. Elwin MochaBLAIR WALDEN , who verbally acknowledged these results.   Electronically Signed   By: Loralie ChampagneMark  Gallerani M.D.   On: 04/20/2014 21:06    Anti-infectives: Anti-infectives   Start     Dose/Rate Route Frequency Ordered Stop   04/20/14 1930  vancomycin (VANCOCIN) IVPB 1000 mg/200 mL premix     1,000 mg 200 mL/hr over 60 Minutes Intravenous  Once 04/20/14 1923 04/20/14 2043   04/20/14 1930  piperacillin-tazobactam (ZOSYN) IVPB 3.375 g     3.375 g 100 mL/hr over 30 Minutes Intravenous  Once 04/20/14 1923 04/20/14 2010       Assessment/Plan  1. Acute appendicitis, POD1 status post laparoscopic appendectomy  2. Anemia, unknown etiology 3. Tachycardia 4. Tachypnea 5. Endometrial thickening 6. Hypokalemia  Plan: 1. We'll give clear liquids today. Continue IV antibiotic therapy. Given the wide range of vital signs, we will leave her in the step down unit today for closer monitoring. 2. Change IV fluids and add potassium. She is hypokalemic and hopefully this will help replace some of her potassium. Repeat labs in the morning to follow this up. 3. Mobilize and pulmonary toilet  LOS: 1 day    Para Cossey E 04/21/2014, 8:06 AM Pager: [phone]  ADDENDUM: Due to persistent tachypnea and tachycardia with intermittent desats, we will order a CTA of the chest to rule out PE.  We will also resume Zosyn. Unknown cause of anemia, may need to order an anemia panel or eventually have the hematologist see her. She will need to see the [facility] as an outpatient due to the abnormal findings of her uterus.

## 2014-04-21 NOTE — Plan of Care (Signed)
Problem: Phase I Progression Outcomes Goal: OOB as tolerated unless otherwise ordered Outcome: Not Progressing SHOB and tachypnea at rest and sleeping.  Goal: Voiding-avoid urinary catheter unless indicated Outcome: Not Progressing Severe, acute urinary retention. Goal: Vital signs/hemodynamically stable Outcome: Progressing Tachycardia, tachypnea.  Problem: Phase II Progression Outcomes Goal: Progressing with IS, TCDB Outcome: Progressing 500 volume

## 2014-04-21 NOTE — Transfer of Care (Signed)
Immediate Anesthesia Transfer of Care Note  Patient: Tiffany [name]  Procedure(s) Performed: Procedure(s) (LRB): APPENDECTOMY LAPAROSCOPIC (N/A)  Patient Location: PACU  Anesthesia Type: General  Level of Consciousness: sedated, patient cooperative and responds to stimulation  Airway & Oxygen Therapy: Patient Spontanous Breathing and Patient connected to face mask oxgen  Post-op Assessment: Report given to PACU RN and Post -op Vital signs reviewed and stable  Post vital signs: Reviewed and stable  Complications: No apparent anesthesia complications

## 2014-04-22 ENCOUNTER — Encounter (HOSPITAL_COMMUNITY): Payer: Self-pay | Admitting: Pulmonary Disease

## 2014-04-22 ENCOUNTER — Inpatient Hospital Stay (HOSPITAL_COMMUNITY): Payer: Medicaid Other

## 2014-04-22 DIAGNOSIS — R7881 Bacteremia: Secondary | ICD-10-CM

## 2014-04-22 LAB — URINE CULTURE: Special Requests: NORMAL

## 2014-04-22 LAB — BASIC METABOLIC PANEL
ANION GAP: 13 (ref 5–15)
BUN: 8 mg/dL (ref 6–23)
CHLORIDE: 107 meq/L (ref 96–112)
CO2: 16 mEq/L — ABNORMAL LOW (ref 19–32)
CREATININE: 0.53 mg/dL (ref 0.50–1.10)
Calcium: 8.6 mg/dL (ref 8.4–10.5)
Glucose, Bld: 197 mg/dL — ABNORMAL HIGH (ref 70–99)
Potassium: 4.2 mEq/L (ref 3.7–5.3)
Sodium: 136 mEq/L — ABNORMAL LOW (ref 137–147)

## 2014-04-22 LAB — CBC
HCT: 24.7 % — ABNORMAL LOW (ref 36.0–46.0)
Hemoglobin: 8 g/dL — ABNORMAL LOW (ref 12.0–15.0)
MCH: 19.9 pg — ABNORMAL LOW (ref 26.0–34.0)
MCHC: 32.4 g/dL (ref 30.0–36.0)
MCV: 61.4 fL — AB (ref 78.0–100.0)
PLATELETS: 216 10*3/uL (ref 150–400)
RBC: 4.02 MIL/uL (ref 3.87–5.11)
RDW: 18.3 % — AB (ref 11.5–15.5)
WBC: 16.2 10*3/uL — AB (ref 4.0–10.5)

## 2014-04-22 MED ORDER — VANCOMYCIN HCL 500 MG IV SOLR
500.0000 mg | Freq: Three times a day (TID) | INTRAVENOUS | Status: DC
  Administered 2014-04-22 – 2014-04-24 (×7): 500 mg via INTRAVENOUS
  Filled 2014-04-22 (×10): qty 500

## 2014-04-22 MED ORDER — SODIUM CHLORIDE 0.9 % IV SOLN
INTRAVENOUS | Status: DC
  Administered 2014-04-22 – 2014-04-24 (×4): via INTRAVENOUS

## 2014-04-22 NOTE — Progress Notes (Signed)
Patient tachycardic, respirations in the 30's, and fatigue at rest. Not appropriate for ambulation at this time. Will continue to re-evaluate and monitor.

## 2014-04-22 NOTE — Consult Note (Signed)
PULMONARY / CRITICAL CARE MEDICINE   Name: Tiffany [name] MRN: [id number] DOB: 03/04/1981    ADMISSIOMaggie [name] DATE:  04/20/2014 CONSULTATION DATE:  04/22/14  REFERRING MD :  Dr. Abbey Chattersosenbower  CHIEF COMPLAINT:  Concern for Sepsis   INITIAL PRESENTATION: 33 y/o F admitted on 8/17 for nausea / vomiting & fever.  Found to have acute appendicitis and underwent laparoscopic appendectomy on 8/17.  Post surgery, her cultures were positive for GPC's & GNR's.  PCCM consulted for sepsis.  STUDIES:  8//17 CT ABD >> acute appendicitis without definite perforation, severe surrounding inflammation & inflamed nodes.  8/19 CTA Chest >> No PE, Mod Pleural Effusions with compressive atx, pneumoperitoneum beneath R hemidiaphragm  SIGNIFICANT EVENTS: 8/17  Admit with nausea & fever.  Dx with acute appendicitis s/p laparoscopic appendectomy    HISTORY OF PRESENT ILLNESS: 33 y/o F with no known medical history who [facility] with approximately 48 hours of RLQ abdominal pain, nausea, vomiting and fever.  ER work up noted the patient to have sepsis and a CT of the abdomen consistent with acute appendicitis without definite perforation, severe surrounding inflammation & inflamed nodes.  She underwent laparoscopic appendectomy on 8/17.  Post surgery, her blood cultures were positive for E-Coli and GPC's.  She also was noted to have bilateral pleural effusions with compressive atelectasis.  Patient remained hemodynamically stable.  PCCM consulted for assistance.    PAST MEDICAL HISTORY :  History reviewed. No pertinent past medical history. Past Surgical History  Procedure Laterality Date  . Laparoscopic appendectomy N/A 04/20/2014    Procedure: APPENDECTOMY LAPAROSCOPIC;  Surgeon: Tiffany LeveeAlicia Thomas, MD;  Location: [facility];  Service: General;  Laterality: N/A;   Prior to Admission medications   Medication Sig Start Date End Date Taking? Authorizing Provider  acetaminophen (TYLENOL) 500 MG tablet Take 500 mg by mouth  every 6 (six) hours as needed for mild pain, fever or headache.   Yes Historical Provider, MD   No Known Allergies  FAMILY HISTORY:  History reviewed. No pertinent family history.  SOCIAL HISTORY:  reports that she has never smoked. She has never used smokeless tobacco. She reports that she does not drink alcohol or use illicit drugs.  REVIEW OF SYSTEMS:   Gen: Denies fever, chills, weight change, fatigue, night sweats HEENT: Denies blurred vision, double vision, hearing loss, tinnitus, sinus congestion, rhinorrhea, sore throat, neck stiffness, dysphagia PULM: Denies cough, sputum production, hemoptysis, wheezing.  Reports mild SOB, difficulty taking a deep breath due to abd pain CV: Denies chest pain, edema, orthopnea, paroxysmal nocturnal dyspnea, palpitations GI: Denies nausea, vomiting, diarrhea, hematochezia, melena, constipation, change in bowel habits.  Reports lower abdominal pain, decreased appetite.  GU: Denies dysuria, hematuria, polyuria, oliguria, urethral discharge Endocrine: Denies hot or cold intolerance, polyuria, polyphagia or appetite change Derm: Denies rash, dry skin, scaling or peeling skin change Heme: Denies easy bruising, bleeding, bleeding gums Neuro: Denies headache, numbness, weakness, slurred speech, loss of memory or consciousness   SUBJECTIVE: Pt reports mild lower abdominal pain, decreased appetite.   VITAL SIGNS: Temp:  [98.4 F (36.9 C)-99.4 F (37.4 C)] 99.4 F (37.4 C) (08/19 0400) Pulse Rate:  [105-131] 111 (08/19 0600) Resp:  [21-46] 21 (08/19 0600) BP: (98-113)/(58-83) 102/63 mmHg (08/19 0600) SpO2:  [95 %-99 %] 96 % (08/19 0600) Weight:  [88 lb 6.5 oz (40.1 kg)] 88 lb 6.5 oz (40.1 kg) (08/19 0400)  HEMODYNAMICS:    VENTILATOR SETTINGS:    INTAKE / OUTPUT:  Intake/Output Summary (  Last 24 hours) at 04/22/14 0906 Last data filed at 04/22/14 1610  Gross per 24 hour  Intake   2110 ml  Output   6103 ml  Net  -3993 ml    PHYSICAL  EXAMINATION: General:  Thin adult female in NAD  Neuro:  AAOx4, speech clear, MAE  HEENT:  Mm pink/moist, no jvd Cardiovascular:  s1s2 rrr, no m/r/g Lungs:  tachypnea, lungs bilaterally diminished posterior lower, otherwise clear Abdomen:  Round/soft, bsx4 active  Musculoskeletal:  No acute deformities  Skin:  Warm/dry, no edema   LABS:  CBC  Recent Labs Lab 04/20/14 1851 04/21/14 1409 04/22/14 0001  WBC 7.3 22.1* 16.2*  HGB 8.9* 8.6* 8.0*  HCT 27.8* 26.6* 24.7*  PLT 228 190 216   Coag's No results found for this basename: APTT, INR,  in the last 168 hours BMET  Recent Labs Lab 04/20/14 1851 04/21/14 1409 04/22/14 0001  NA 133* 137 136*  K 3.2* 4.3 4.2  CL 99 108 107  CO2 16* 14* 16*  BUN 10 10 8   CREATININE 0.54 0.49* 0.53  GLUCOSE 91 146* 197*   Electrolytes  Recent Labs Lab 04/20/14 1851 04/21/14 1409 04/22/14 0001  CALCIUM 8.1* 8.7 8.6   Sepsis Markers  Recent Labs Lab 04/20/14 1847  LATICACIDVEN 3.66*   ABG No results found for this basename: PHART, PCO2ART, PO2ART,  in the last 168 hours Liver Enzymes  Recent Labs Lab 04/20/14 1851  AST 37  ALT 36*  ALKPHOS 187*  BILITOT 0.9  ALBUMIN 2.9*   Cardiac Enzymes No results found for this basename: TROPONINI, PROBNP,  in the last 168 hours Glucose No results found for this basename: GLUCAP,  in the last 168 hours  Imaging Ct Angio Chest Pe W/cm &/or Wo Cm  04/21/2014   CLINICAL DATA:  Status post appendectomy yesterday, increased respiratory rate, tachycardia, shortness of breath, chest pain  EXAM: CT ANGIOGRAPHY CHEST WITH CONTRAST  TECHNIQUE: Multidetector CT imaging of the chest was performed using the standard protocol during bolus administration of intravenous contrast. Multiplanar CT image reconstructions and MIPs were obtained to evaluate the vascular anatomy.  CONTRAST:  OMNIPAQUE IOHEXOL 350 MG/ML SOLN  COMPARISON:  04/20/2014 abdomen CT  FINDINGS: Pulmonary arteries are well  visualized and appear patent. No significant filling defect or pulmonary embolus demonstrated by CTA. Normal heart size. No pericardial effusion. Pulsation artifact noted across the ascending thoracic aorta. No definite dissection. Major branch vessels appear patent. Interval development dependent bilateral layering moderate pleural effusions. No adenopathy.  Lung windows demonstrate the dependent effusions with increased bibasilar atelectasis/ consolidation since yesterday. No pneumothorax.  Included upper abdomen demonstrates free air beneath the right hemidiaphragm suspect related to the recent appendectomy.  No acute osseous finding. Motion artifact noted across the sternum and manubrium.  Review of the MIP images confirms the above findings.  IMPRESSION: No significant acute pulmonary embolus by CTA.  Interval development of moderate pleural effusions with compressive bibasilar atelectasis.  Pneumoperitoneum beneath the right hemidiaphragm compatible with recent abdominal surgery yesterday.   Electronically Signed   By: Ruel Favors M.D.   On: 04/21/2014 15:57     ASSESSMENT / PLAN:  PULMONARY OETT n/a A: Bilateral Pleural Effusions - with associated compressive atx.  ? If sympathetic effusions with sepsis Tachypnea - in setting of pain, sepsis P:   Trend CXR Oxygen to support sats > 92% Pulmonary hygiene as able, mobilize Consider diuresis once post acute phase illness for effusions, if unchanged, may  need to sample  CARDIOVASCULAR CVL n/a A:  Tachycardia - in setting of sepsis, hemodynamically stable Sepsis P:  ICU monitoring See ID NS at 75 ml/hr  RENAL A:   Mild Hyponatremia  P:   Change IVF to NS @ 75 Monitor electrolytes and replace as indicated   GASTROINTESTINAL A:   Appendicitis s/p Appendcectomy - completed 8/17 Nausea  P:   Diet as tolerated  Surgical care per [facility] PRN Zofran Assess pelvic US (per [facility])  HEMATOLOGIC A:   Anemia  P:  Monitor CBC Tx for  Hgb < 7%, active bleeding or MI <8% DVT Proph: Lovenox sq   INFECTIOUS A:  Appendicitis - afebrile, improved leukocytosis 8/19 E-Coli Bacteremia  P:   BCx2 8/17 >> Ecoli >>                   >> GPC's in chains >>  UC 8/17 >> 20k multiple morphotypes Abx: vanco, start date 8/17, day 2/x         Zosyn, start date 8/17, day 2/x  ENDOCRINE A:   Hyperglycemia  P:   Change IVF to NS (remove dextrose) SSI if CBG > 200 consistently   NEUROLOGIC A:   Pain - post operative appendectomy P:   RASS goal: n/a Motrin, Morphine, Percocet PRN  TODAY'S SUMMARY: 33 y/o F admitted with appendicitis s/p appendectomy 8/17.  BC growing E-Coli & GPC's in chains post op.  PCCM consulted for sepsis.    Thank you for the consultation.  We will follow along with you.    Canary Brim, NP-C [facility] Pgr: [phone] or [phone]  Attending:  I have seen and examined the patient with nurse practitioner/resident and agree with the note above.   Heber Kenvir, MD [facility] Pager: [phone] Cell: [phone] If no response, call [phone]  04/22/2014, 9:06 AM

## 2014-04-22 NOTE — Progress Notes (Signed)
Patient seen and examined.  Agree with PA's note. Very atypical for routine appendicitis to cause two organism bacteremia.  Wonder if she has endometritis as well.  Will get pelvic US.

## 2014-04-22 NOTE — Progress Notes (Signed)
Patient ID: Tiffany [name], female   DOB: 15-Apr-1981, 33 y.o.   MRN: [id number] 2 Days Post-Op  Subjective: Pt says she feels better today, still with a little bit of pelvic pain.  No nausea.  Tolerating clear liquids  Objective: Vital signs in last 24 hours: Temp:  [98.4 F (36.9 C)-99.4 F (37.4 C)] 99.4 F (37.4 C) (08/19 0400) Pulse Rate:  [105-131] 111 (08/19 0600) Resp:  [21-46] 21 (08/19 0600) BP: (98-113)/(58-83) 102/63 mmHg (08/19 0600) SpO2:  [95 %-99 %] 96 % (08/19 0600) Weight:  [88 lb 6.5 oz (40.1 kg)] 88 lb 6.5 oz (40.1 kg) (08/19 0400) Last BM Date:  (Unknown)  Intake/Output from previous day: 08/18 0701 - 08/19 0700 In: 2260 [P.O.:460; I.V.:1650; IV Piggyback:150] Out: 6103 [Urine:6103] Intake/Output this shift:    PE: Abd: soft, appropriately tender, +BS, ND, incisions c/d/i Heart: regular, but tachy Lungs: CTAB, normal effort while sleeping, when awakened, she becomes tachypnic.  Lab Results:   Recent Labs  04/21/14 1409 04/22/14 0001  WBC 22.1* 16.2*  HGB 8.6* 8.0*  HCT 26.6* 24.7*  PLT 190 216   BMET  Recent Labs  04/21/14 1409 04/22/14 0001  NA 137 136*  K 4.3 4.2  CL 108 107  CO2 14* 16*  GLUCOSE 146* 197*  BUN 10 8  CREATININE 0.49* 0.53  CALCIUM 8.7 8.6   PT/INR No results found for this basename: LABPROT, INR,  in the last 72 hours CMP     Component Value Date/Time   NA 136* 04/22/2014 0001   K 4.2 04/22/2014 0001   CL 107 04/22/2014 0001   CO2 16* 04/22/2014 0001   GLUCOSE 197* 04/22/2014 0001   BUN 8 04/22/2014 0001   CREATININE 0.53 04/22/2014 0001   CALCIUM 8.6 04/22/2014 0001   PROT 6.7 04/20/2014 1851   ALBUMIN 2.9* 04/20/2014 1851   AST 37 04/20/2014 1851   ALT 36* 04/20/2014 1851   ALKPHOS 187* 04/20/2014 1851   BILITOT 0.9 04/20/2014 1851   GFRNONAA >90 04/22/2014 0001   GFRAA >90 04/22/2014 0001   Lipase     Component Value Date/Time   LIPASE 18 04/20/2014 1851       Studies/Results: Ct Angio Chest Pe W/cm &/or Wo  Cm  04/21/2014   CLINICAL DATA:  Status post appendectomy yesterday, increased respiratory rate, tachycardia, shortness of breath, chest pain  EXAM: CT ANGIOGRAPHY CHEST WITH CONTRAST  TECHNIQUE: Multidetector CT imaging of the chest was performed using the standard protocol during bolus administration of intravenous contrast. Multiplanar CT image reconstructions and MIPs were obtained to evaluate the vascular anatomy.  CONTRAST:  OMNIPAQUE IOHEXOL 350 MG/ML SOLN  COMPARISON:  04/20/2014 abdomen CT  FINDINGS: Pulmonary arteries are well visualized and appear patent. No significant filling defect or pulmonary embolus demonstrated by CTA. Normal heart size. No pericardial effusion. Pulsation artifact noted across the ascending thoracic aorta. No definite dissection. Major branch vessels appear patent. Interval development dependent bilateral layering moderate pleural effusions. No adenopathy.  Lung windows demonstrate the dependent effusions with increased bibasilar atelectasis/ consolidation since yesterday. No pneumothorax.  Included upper abdomen demonstrates free air beneath the right hemidiaphragm suspect related to the recent appendectomy.  No acute osseous finding. Motion artifact noted across the sternum and manubrium.  Review of the MIP images confirms the above findings.  IMPRESSION: No significant acute pulmonary embolus by CTA.  Interval development of moderate pleural effusions with compressive bibasilar atelectasis.  Pneumoperitoneum beneath the right hemidiaphragm compatible with recent abdominal surgery  yesterday.   Electronically Signed   By: Ruel Favors M.D.   On: 04/21/2014 15:57   Ct Abdomen Pelvis W Contrast  04/20/2014   CLINICAL DATA:  Right-sided abdominal pain.  EXAM: CT ABDOMEN AND PELVIS WITH CONTRAST  TECHNIQUE: Multidetector CT imaging of the abdomen and pelvis was performed using the standard protocol following bolus administration of intravenous contrast.  CONTRAST:  80mL  OMNIPAQUE IOHEXOL 300 MG/ML SOLN, 50mL OMNIPAQUE IOHEXOL 300 MG/ML SOLN  COMPARISON:  None.  FINDINGS: Lung bases:  Patchy areas of subsegmental atelectasis. No pleural effusion. The heart is normal in size. No pericardial effusion the distal esophagus is grossly normal.  CT abdomen:  The liver is unremarkable. No focal lesions or biliary dilatation. The gallbladder is grossly normal despite motion artifact. No common bile duct dilatation. The pancreas is grossly normal. The spleen is normal in size. No focal lesions. The adrenal glands and kidneys are unremarkable despite motion artifact.  The stomach, duodenum, small bowel and colon are grossly normal. The appendix is distended and demonstrates significant inflammation with mucosal enhancement. I do not see any surrounding fluid collections to suggest perforation. Surrounding enlarged lymph nodes are likely inflamed. The terminal ileum is unremarkable. There is mild associated inflammation involving the cecum. No mesenteric or retroperitoneal mass or adenopathy. The aorta and branch vessels are normal. The major venous structures are patent.  CT pelvis:  The uterus is abnormal. There is diffuse low attenuation surrounding the endometrium in the fundal region. Findings could be due to adenomyosis. Endometritis would be another possibility. Could not exclude the possibility of endometrial cancer but I think that would be unusual in a 33 year old. Recommend followup pelvic ultrasound (non urgent). Both ovaries are unremarkable except for multiple follicles. The bladder is normal. No pelvic mass, adenopathy or free pelvic fluid collections. Moderately prominent parametrial vasculature. No inguinal mass or adenopathy.  Bony structures:  Unremarkable.  IMPRESSION: 1. Acute appendicitis without definite perforation. Severe surrounding inflammation and inflamed nodes. 2. Abnormal CT appearance of the uterus. Please see above discussion. Pelvic ultrasound recommended (non  urgent). These results were called by telephone at the time of interpretation on 04/20/2014 at 9:06 pm to Dr. Elwin Mocha , who verbally acknowledged these results.   Electronically Signed   By: Loralie Champagne M.D.   On: 04/20/2014 21:06    Anti-infectives: Anti-infectives   Start     Dose/Rate Route Frequency Ordered Stop   04/22/14 0900  vancomycin (VANCOCIN) 500 mg in sodium chloride 0.9 % 100 mL IVPB     500 mg 100 mL/hr over 60 Minutes Intravenous Every 8 hours 04/22/14 0728     04/21/14 1400  piperacillin-tazobactam (ZOSYN) IVPB 3.375 g     3.375 g 12.5 mL/hr over 240 Minutes Intravenous 3 times per day 04/21/14 1211     04/20/14 1930  vancomycin (VANCOCIN) IVPB 1000 mg/200 mL premix     1,000 mg 200 mL/hr over 60 Minutes Intravenous  Once 04/20/14 1923 04/20/14 2043   04/20/14 1930  piperacillin-tazobactam (ZOSYN) IVPB 3.375 g     3.375 g 100 mL/hr over 30 Minutes Intravenous  Once 04/20/14 1923 04/20/14 2010       Assessment/Plan  1. Acute appendicitis, POD 2, s/p lap appy, routine appendicitis 2. Bacteremia, gram - rods, gram + cocci 3. Microcytic anemia 4. Low-grade sepsis 5. Abnormal endometrial thickening  Plan: 1. May advance diet as tolerates 2. Cont Zosyn D2.  Add Vanc today for Gram + cocci in blood 3. A  routine appendicitis typically does not cause bacteremia.  It would possible explain the gram - findings, though, but not really the gram + findings.  We will obtain a pelvic ultrasound today to further evaluate her uterus given her findings on her CT scan. 4. Dr. Abbey Chattersosenbower has discussed this case with CCM and asked if they would evaluate this patient and help us out as well. 5. Her anemia panel shows low iron; however, the patient states she does not have a heavy menstrual cycle or any other bleeding.  Given her Asian decent, ? A need for workup for a thalassemia. 6. CTA of chest was negative for PE. 7. Would not transfuse unless symptomatic or <7. 8. Mobilize  and pulm toilet    LOS: 2 days    Lucina Betty E 04/22/2014, 7:43 AM Pager: [phone]

## 2014-04-22 NOTE — Progress Notes (Signed)
ANTIBIOTIC CONSULT NOTE - INITIAL  Pharmacy Consult for Vancomycin Indication: bacteremia  No Known Allergies  Patient Measurements: Height: 4\' 9"  (144.8 cm) Weight: 88 lb 6.5 oz (40.1 kg) IBW/kg (Calculated) : 38.6  Vital Signs: Temp: 99.4 F (37.4 C) (08/19 0400) Temp src: Oral (08/19 0400) BP: 102/63 mmHg (08/19 0600) Pulse Rate: 111 (08/19 0600) Intake/Output from previous day: 08/18 0701 - 08/19 0700 In: 2260 [P.O.:460; I.V.:1650; IV Piggyback:150] Out: 6103 [Urine:6103] Intake/Output from this shift:    Labs:  Recent Labs  04/20/14 1851 04/21/14 1409 04/22/14 0001  WBC 7.3 22.1* 16.2*  HGB 8.9* 8.6* 8.0*  PLT 228 190 216  CREATININE 0.54 0.49* 0.53   Estimated Creatinine Clearance: 60.4 ml/min (by C-G formula based on Cr of 0.53). No results found for this basename: VANCOTROUGH, Leodis Binet, VANCORANDOM, GENTTROUGH, GENTPEAK, GENTRANDOM, TOBRATROUGH, TOBRAPEAK, TOBRARND, AMIKACINPEAK, AMIKACINTROU, AMIKACIN,  in the last 72 hours   Microbiology: Recent Results (from the past 720 hour(s))  CULTURE, BLOOD (ROUTINE X 2)     Status: None   Collection Time    04/20/14  6:52 PM      Result Value Ref Range Status   Specimen Description BLOOD RIGHT ANTECUBITAL   Final   Special Requests BOTTLES DRAWN AEROBIC ONLY   Final   Culture  Setup Time     Final   Value: 04/20/2014 22:38     Performed at [facility]   Culture     Final   Value: GRAM POSITIVE COCCI IN CHAINS     Note: Gram Stain Report Called to,Read Back By and Verified With: LISA COBB 04/22/14 1212A FULKC     Performed at [facility]   Report Status PENDING   Incomplete  CULTURE, BLOOD (ROUTINE X 2)     Status: None   Collection Time    04/20/14  6:53 PM      Result Value Ref Range Status   Specimen Description BLOOD RIGHT ANTECUBITAL   Final   Special Requests BOTTLES DRAWN AEROBIC ONLY   Final   Culture  Setup Time     Final   Value: 04/20/2014 22:37     Performed at  [facility]   Culture     Final   Value: GRAM NEGATIVE RODS     Note: Gram Stain Report Called to,Read Back By and Verified With: STEPHANIE RUSSELL 04/21/14 1325 BY [email or web address]     Performed at [facility]   Report Status PENDING   Incomplete  URINE CULTURE     Status: None   Collection Time    04/20/14  7:33 PM      Result Value Ref Range Status   Specimen Description URINE, CLEAN CATCH   Final   Special Requests Normal   Final   Culture  Setup Time     Final   Value: 04/21/2014 05:50     Performed at [facility] Count     Final   Value: 20,OOO COLONIES/ML     Performed at [facility]   Culture     Final   Value: Multiple bacterial morphotypes present, none predominant. Suggest appropriate recollection if clinically indicated.     Performed at [facility]   Report Status 04/22/2014 FINAL   Final  MRSA PCR SCREENING     Status: None   Collection Time    04/21/14  2:45 AM      Result Value Ref Range Status  MRSA by PCR NEGATIVE  NEGATIVE Final   Comment:            The GeneXpert MRSA Assay (FDA     approved for NASAL specimens     only), is one component of a     comprehensive MRSA colonization     surveillance program. It is not     intended to diagnose MRSA     infection nor to guide or     monitor treatment for     MRSA infections.    Medical History: History reviewed. No pertinent past medical history.  Assessment: [age] yoF presented with acute appendicitis, s/p appendectomy. Blood culture reveals GNR and GPC in chains. Pharmacy consulted to start Vanco for bactermia.  8/18 >>Zosyn >>   Tmax: 99.4 WBCs: 16.2 trending down Renal: SCr 0.53 CrCl > 100  8/17 blood: GPC in chains 8/17 blood: GNR 8/17 urine: multiple bacterial, none predominant F  Goal of Therapy:  Vancomycin trough level 15-20 mcg/ml  Plan:  Start Vancomycin 500mg  IV Q8H Measure antibiotic drug levels at steady state Follow up culture  results  Loma BostonLaura Resha Filippone, PharmD Pager: [phone] 04/22/2014 7:27 AM

## 2014-04-23 ENCOUNTER — Inpatient Hospital Stay (HOSPITAL_COMMUNITY): Payer: Medicaid Other

## 2014-04-23 DIAGNOSIS — J90 Pleural effusion, not elsewhere classified: Secondary | ICD-10-CM

## 2014-04-23 DIAGNOSIS — D509 Iron deficiency anemia, unspecified: Secondary | ICD-10-CM

## 2014-04-23 DIAGNOSIS — A419 Sepsis, unspecified organism: Secondary | ICD-10-CM

## 2014-04-23 DIAGNOSIS — K3580 Unspecified acute appendicitis: Secondary | ICD-10-CM

## 2014-04-23 DIAGNOSIS — R6520 Severe sepsis without septic shock: Secondary | ICD-10-CM

## 2014-04-23 LAB — CBC
HCT: 25.8 % — ABNORMAL LOW (ref 36.0–46.0)
Hemoglobin: 8.3 g/dL — ABNORMAL LOW (ref 12.0–15.0)
MCH: 19.4 pg — ABNORMAL LOW (ref 26.0–34.0)
MCHC: 32.2 g/dL (ref 30.0–36.0)
MCV: 60.4 fL — ABNORMAL LOW (ref 78.0–100.0)
PLATELETS: 280 10*3/uL (ref 150–400)
RBC: 4.27 MIL/uL (ref 3.87–5.11)
RDW: 18.1 % — ABNORMAL HIGH (ref 11.5–15.5)
WBC: 7.4 10*3/uL (ref 4.0–10.5)

## 2014-04-23 LAB — BASIC METABOLIC PANEL
Anion gap: 13 (ref 5–15)
BUN: 9 mg/dL (ref 6–23)
CALCIUM: 9 mg/dL (ref 8.4–10.5)
CO2: 20 meq/L (ref 19–32)
Chloride: 103 mEq/L (ref 96–112)
Creatinine, Ser: 0.56 mg/dL (ref 0.50–1.10)
GFR calc Af Amer: >90 mL/min (ref >90)
GFR calc non Af Amer: >90 mL/min (ref >90)
GLUCOSE: 93 mg/dL (ref 70–99)
Potassium: 4 mEq/L (ref 3.7–5.3)
SODIUM: 136 meq/L — AB (ref 137–147)

## 2014-04-23 LAB — DIFFERENTIAL
BASOS ABS: 0 10*3/uL (ref 0.0–0.1)
Basophils Relative: 1 % (ref 0–1)
EOS ABS: 0.2 10*3/uL (ref 0.0–0.7)
EOS PCT: 2 % (ref 0–5)
Lymphocytes Relative: 31 % (ref 12–46)
Lymphs Abs: 2.3 10*3/uL (ref 0.7–4.0)
Monocytes Absolute: 0.5 10*3/uL (ref 0.1–1.0)
Monocytes Relative: 8 % (ref 3–12)
Neutro Abs: 4.2 10*3/uL (ref 1.7–7.7)
Neutrophils Relative %: 58 % (ref 43–77)

## 2014-04-23 LAB — CULTURE, BLOOD (ROUTINE X 2)

## 2014-04-23 LAB — HIV ANTIBODY (ROUTINE TESTING W REFLEX): HIV 1&2 Ab, 4th Generation: NONREACTIVE

## 2014-04-23 LAB — OCCULT BLOOD X 1 CARD TO LAB, STOOL: Fecal Occult Bld: NEGATIVE

## 2014-04-23 MED ORDER — FERROUS SULFATE 325 (65 FE) MG PO TABS
325.0000 mg | ORAL_TABLET | Freq: Every day | ORAL | Status: DC
Start: 2014-04-23 — End: 2014-04-23

## 2014-04-23 MED ORDER — FERROUS SULFATE 325 (65 FE) MG PO TABS
325.0000 mg | ORAL_TABLET | Freq: Three times a day (TID) | ORAL | Status: DC
  Administered 2014-04-23 – 2014-04-24 (×3): 325 mg via ORAL
  Filled 2014-04-23 (×6): qty 1

## 2014-04-23 MED ORDER — POLYETHYLENE GLYCOL 3350 17 G PO PACK
17.0000 g | PACK | Freq: Every day | ORAL | Status: DC
  Administered 2014-04-23: 17 g via ORAL
  Filled 2014-04-23 (×2): qty 1

## 2014-04-23 NOTE — Progress Notes (Signed)
PULMONARY / CRITICAL CARE MEDICINE   Name: Tiffany [name] MRN: [id number] DOB: 02-16-1981    ADMISSION DATE:  04/20/2014 CONSULTATION DATE:  04/22/14  REFERRING MD :  Dr. Abbey Chatters  CHIEF COMPLAINT:  Concern for Sepsis   INITIAL PRESENTATION: 33 y/o F admitted on 8/17 for nausea / vomiting & fever.  Found to have acute appendicitis and underwent laparoscopic appendectomy on 8/17.  Post surgery, her cultures were positive for GPC's & GNR's.  PCCM consulted for sepsis.  STUDIES:  8//17 CT ABD >> acute appendicitis without definite perforation, severe surrounding inflammation & inflamed nodes.  8/19 CTA Chest >> No PE, Mod Pleural Effusions with compressive atx, pneumoperitoneum beneath R hemidiaphragm 8/20 CXR > resolved effusions  SIGNIFICANT EVENTS: 8/17  Admit with nausea/vomiting & fever.  Dx with acute appendicitis s/p laparoscopic appendectomy    SUBJECTIVE:    VITAL SIGNS: Temp:  [98.7 F (37.1 C)-99.7 F (37.6 C)] 99 F (37.2 C) (08/20 0400) Pulse Rate:  [87-122] 98 (08/20 0800) Resp:  [14-37] 28 (08/20 0800) BP: (98-123)/(55-83) 108/66 mmHg (08/20 0800) SpO2:  [95 %-99 %] 99 % (08/20 0800)  HEMODYNAMICS:    VENTILATOR SETTINGS:    INTAKE / OUTPUT:  Intake/Output Summary (Last 24 hours) at 04/23/14 3474 Last data filed at 04/23/14 0800  Gross per 24 hour  Intake   2490 ml  Output   3675 ml  Net  -1185 ml    PHYSICAL EXAMINATION: [name]:  Thin adult female in NAD  Neuro:  AAOx4, speech clear, MAE  HEENT:  Mm pink/moist, no jvd Cardiovascular:  s1s2 rrr, no m/r/g Lungs:  tachypnea, lungs bilaterally diminished posterior lower, otherwise clear Abdomen:  Round/soft, bsx4 active  Musculoskeletal:  No acute deformities  Skin:  Warm/dry, no edema   LABS:  CBC  Recent Labs Lab 04/20/14 1851 04/21/14 1409 04/22/14 0001  WBC 7.3 22.1* 16.2*  HGB 8.9* 8.6* 8.0*  HCT 27.8* 26.6* 24.7*  PLT 228 190 216   Coag's No results found for this basename: APTT, INR,   in the last 168 hours BMET  Recent Labs Lab 04/20/14 1851 04/21/14 1409 04/22/14 0001  NA 133* 137 136*  K 3.2* 4.3 4.2  CL 99 108 107  CO2 16* 14* 16*  BUN 10 10 8   CREATININE 0.54 0.49* 0.53  GLUCOSE 91 146* 197*   Electrolytes  Recent Labs Lab 04/20/14 1851 04/21/14 1409 04/22/14 0001  CALCIUM 8.1* 8.7 8.6   Sepsis Markers  Recent Labs Lab 04/20/14 1847  LATICACIDVEN 3.66*   ABG No results found for this basename: PHART, PCO2ART, PO2ART,  in the last 168 hours Liver Enzymes  Recent Labs Lab 04/20/14 1851  AST 37  ALT 36*  ALKPHOS 187*  BILITOT 0.9  ALBUMIN 2.9*   Cardiac Enzymes No results found for this basename: TROPONINI, PROBNP,  in the last 168 hours Glucose No results found for this basename: GLUCAP,  in the last 168 hours  Imaging US Transvaginal Non-ob  04/22/2014   CLINICAL DATA:  Abnormal appearance of the uterus on CT. Evaluate endometrium.  EXAM: TRANSABDOMINAL AND TRANSVAGINAL ULTRASOUND OF PELVIS  TECHNIQUE: Both transabdominal and transvaginal ultrasound examinations of the pelvis were performed. Transabdominal technique was performed for global imaging of the pelvis including uterus, ovaries, adnexal regions, and pelvic cul-de-sac. It was necessary to proceed with endovaginal exam following the transabdominal exam to visualize the endometrium.  COMPARISON:  CT abdomen and pelvis 04/20/2014  FINDINGS: Uterus  Measurements: 6.5 x 4.2 x 5.8 cm.  The myometrium appears mildly thickened in the region of the uterine fundus with an ill-defined region of mildly increased echogenicity. This region does not demonstrate sharply demarcated margins to make an accurate measurement of the size but corresponds to the area of heterogeneous enhancement on the CT.  Endometrium  Thickness: 4 mm. No focal endometrial abnormality visualized. Minimal fluid was noted in the endometrial canal.  Right ovary  Measurements: 4.2 x 2.2 x 3.8 cm. Normal appearance/no adnexal  mass. Multiple follicles measure up to 1.1 cm.  Left ovary  Measurements: 3.4 x 2.9 x 2.7 cm. Normal appearance/no adnexal mass. Multiple follicles measure up to 1.9 cm.  Other findings  Trace pelvic free fluid was noted.  IMPRESSION: 1. Normal endometrial thickness. 2. Heterogeneous appearance of the uterine fundus. Although a well-defined mass is not clearly identified, the findings on ultrasound and CT are favored to represent a fibroid or possibly adenomyoma. If there is clinical concern for a more ominous process or follow-up is clinically desired, a repeat ultrasound could be performed in 3 months.   Electronically Signed   By: Sebastian Ache   On: 04/22/2014 15:42   US Pelvis Complete  04/22/2014   CLINICAL DATA:  Abnormal appearance of the uterus on CT. Evaluate endometrium.  EXAM: TRANSABDOMINAL AND TRANSVAGINAL ULTRASOUND OF PELVIS  TECHNIQUE: Both transabdominal and transvaginal ultrasound examinations of the pelvis were performed. Transabdominal technique was performed for global imaging of the pelvis including uterus, ovaries, adnexal regions, and pelvic cul-de-sac. It was necessary to proceed with endovaginal exam following the transabdominal exam to visualize the endometrium.  COMPARISON:  CT abdomen and pelvis 04/20/2014  FINDINGS: Uterus  Measurements: 6.5 x 4.2 x 5.8 cm. The myometrium appears mildly thickened in the region of the uterine fundus with an ill-defined region of mildly increased echogenicity. This region does not demonstrate sharply demarcated margins to make an accurate measurement of the size but corresponds to the area of heterogeneous enhancement on the CT.  Endometrium  Thickness: 4 mm. No focal endometrial abnormality visualized. Minimal fluid was noted in the endometrial canal.  Right ovary  Measurements: 4.2 x 2.2 x 3.8 cm. Normal appearance/no adnexal mass. Multiple follicles measure up to 1.1 cm.  Left ovary  Measurements: 3.4 x 2.9 x 2.7 cm. Normal appearance/no adnexal  mass. Multiple follicles measure up to 1.9 cm.  Other findings  Trace pelvic free fluid was noted.  IMPRESSION: 1. Normal endometrial thickness. 2. Heterogeneous appearance of the uterine fundus. Although a well-defined mass is not clearly identified, the findings on ultrasound and CT are favored to represent a fibroid or possibly adenomyoma. If there is clinical concern for a more ominous process or follow-up is clinically desired, a repeat ultrasound could be performed in 3 months.   Electronically Signed   By: Sebastian Ache   On: 04/22/2014 15:42     ASSESSMENT / PLAN:  PULMONARY OETT n/a A: Bilateral Sympathetic Pleural Effusions - resolved Tachypnea - in setting of pain, sepsis P:   Trend CXR Oxygen to support sats > 92% Pulmonary hygiene:  IS, mobilize  CARDIOVASCULAR CVL n/a A:  Tachycardia - improved  Sepsis P:  tele See ID NS at 75 ml/hr  RENAL A:   Mild Hyponatremia  P:   Change IVF to NS @ 75 Monitor electrolytes and replace as indicated   GASTROINTESTINAL A:   Appendicitis s/p Appendcectomy - completed 8/17 Nausea  P:   Diet as tolerated  Surgical care per [facility] PRN Zofran Pelvic US (  per [facility]) negative   HEMATOLOGIC A:   Anemia - likely IDA.  Pt denies heavy menses.  Endorses fatigue.   P:  Monitor CBC Tx for Hgb < 7%, active bleeding or MI <8% DVT Proph: Lovenox sq + SCD's Assess FOB Ferrous sulfate 325 TID  May need work up for celiac sprue vs hematology consult  INFECTIOUS A:  Appendicitis - afebrile, improved leukocytosis 8/19 E-Coli Bacteremia + GPC's - likely related to appendicitis, negative pelvic US.   P:   BCx2 8/17 >> Ecoli >>res ampicillin, intermediate to unasyn, otherwise sens                   >> GPC's in chains >>  UC 8/17 >> 20k multiple morphotypes Abx: vanco, start date 8/17, day 4/x         Zosyn, start date 8/17, day 4/x  ENDOCRINE A:   Hyperglycemia  P:   Change IVF to NS (remove dextrose) SSI if CBG > 200  consistently   NEUROLOGIC A:   Pain - post operative appendectomy P:   RASS goal: n/a Motrin, Morphine, Percocet PRN  TODAY'S SUMMARY: 33 y/o F admitted with appendicitis s/p appendectomy 8/17.  BC growing E-Coli & GPC's in chains post op.  Await final cultures, narrow abx as appropriate once cultures returned.  Assess CXR now to eval effusions.   Pt transferring to med-surg per primary MD, mobilize.   PCCM will sign off.  Please call back if further needs arise.    Canary BrimBrandi Ollis, NP-C [facility] Pgr: [phone] or [phone]   04/23/2014, 8:22 AM  Attending:  I have seen and examined the patient with nurse practitioner/resident and agree with the note above.   Overall making a good recovery.  Not sure why she had such a robust inflammatory response or why she had multi-organism bacteremia.  Will add differential to WBC, check HIV panel.  Chronic iron deficiency anemia normally would be explained by heavy menses but the patient denies.  Will check stool for blood.  May need sprue work up vs hematology work up as outpatient.  From my standpoint OK to D/C from ICU. May be able to transition to oral cipro alone, depending on speciation of GPC from blood.    We will sign off but are happy to answer questions or see again if needed.  Heber CarolinaBrent McQuaid, MD [facility] Pager: [phone] Cell: [phone] If no response, call [phone]

## 2014-04-23 NOTE — Progress Notes (Signed)
Patient ID: Tiffany [name], female   DOB: [birth year], 33 y.o.   MRN: [id number] 3 Days Post-Op  Subjective: Pt feels ok this morning.  Tolerating a solid diet, but no BM yet per son.  Is not mobilizing  Objective: Vital signs in last 24 hours: Temp:  [98.2 F (36.8 C)-99.7 F (37.6 C)] 99 F (37.2 C) (08/20 0400) Pulse Rate:  [87-122] 98 (08/20 0600) Resp:  [14-37] 20 (08/20 0600) BP: (98-123)/(55-83) 98/65 mmHg (08/20 0600) SpO2:  [95 %-99 %] 99 % (08/20 0600) Last BM Date:  (Unknown)  Intake/Output from previous day: 08/19 0701 - 08/20 0700 In: 2415 [P.O.:390; I.V.:1575; IV Piggyback:450] Out: 3775 [Urine:3775] Intake/Output this shift:    PE: Abd: soft, minimally tender, +BS, Nd, incisions c/d/i Heart: regular Lungs: CTAB  Lab Results:   Recent Labs  04/21/14 1409 04/22/14 0001  WBC 22.1* 16.2*  HGB 8.6* 8.0*  HCT 26.6* 24.7*  PLT 190 216   BMET  Recent Labs  04/21/14 1409 04/22/14 0001  NA 137 136*  K 4.3 4.2  CL 108 107  CO2 14* 16*  GLUCOSE 146* 197*  BUN 10 8  CREATININE 0.49* 0.53  CALCIUM 8.7 8.6   PT/INR No results found for this basename: LABPROT, INR,  in the last 72 hours CMP     Component Value Date/Time   NA 136* 04/22/2014 0001   K 4.2 04/22/2014 0001   CL 107 04/22/2014 0001   CO2 16* 04/22/2014 0001   GLUCOSE 197* 04/22/2014 0001   BUN 8 04/22/2014 0001   CREATININE 0.53 04/22/2014 0001   CALCIUM 8.6 04/22/2014 0001   PROT 6.7 04/20/2014 1851   ALBUMIN 2.9* 04/20/2014 1851   AST 37 04/20/2014 1851   ALT 36* 04/20/2014 1851   ALKPHOS 187* 04/20/2014 1851   BILITOT 0.9 04/20/2014 1851   GFRNONAA >90 04/22/2014 0001   GFRAA >90 04/22/2014 0001   Lipase     Component Value Date/Time   LIPASE 18 04/20/2014 1851       Studies/Results: Ct Angio Chest Pe W/cm &/or Wo Cm  04/21/2014   CLINICAL DATA:  Status post appendectomy yesterday, increased respiratory rate, tachycardia, shortness of breath, chest pain  EXAM: CT ANGIOGRAPHY CHEST WITH CONTRAST   TECHNIQUE: Multidetector CT imaging of the chest was performed using the standard protocol during bolus administration of intravenous contrast. Multiplanar CT image reconstructions and MIPs were obtained to evaluate the vascular anatomy.  CONTRAST:  OMNIPAQUE IOHEXOL 350 MG/ML SOLN  COMPARISON:  04/20/2014 abdomen CT  FINDINGS: Pulmonary arteries are well visualized and appear patent. No significant filling defect or pulmonary embolus demonstrated by CTA. Normal heart size. No pericardial effusion. Pulsation artifact noted across the ascending thoracic aorta. No definite dissection. Major branch vessels appear patent. Interval development dependent bilateral layering moderate pleural effusions. No adenopathy.  Lung windows demonstrate the dependent effusions with increased bibasilar atelectasis/ consolidation since yesterday. No pneumothorax.  Included upper abdomen demonstrates free air beneath the right hemidiaphragm suspect related to the recent appendectomy.  No acute osseous finding. Motion artifact noted across the sternum and manubrium.  Review of the MIP images confirms the above findings.  IMPRESSION: No significant acute pulmonary embolus by CTA.  Interval development of moderate pleural effusions with compressive bibasilar atelectasis.  Pneumoperitoneum beneath the right hemidiaphragm compatible with recent abdominal surgery yesterday.   Electronically Signed   By: Ruel Favors M.D.   On: 04/21/2014 15:57   US Transvaginal Non-ob  04/22/2014   CLINICAL DATA:  Abnormal appearance of the uterus on CT. Evaluate endometrium.  EXAM: TRANSABDOMINAL AND TRANSVAGINAL ULTRASOUND OF PELVIS  TECHNIQUE: Both transabdominal and transvaginal ultrasound examinations of the pelvis were performed. Transabdominal technique was performed for global imaging of the pelvis including uterus, ovaries, adnexal regions, and pelvic cul-de-sac. It was necessary to proceed with endovaginal exam following the transabdominal  exam to visualize the endometrium.  COMPARISON:  CT abdomen and pelvis 04/20/2014  FINDINGS: Uterus  Measurements: 6.5 x 4.2 x 5.8 cm. The myometrium appears mildly thickened in the region of the uterine fundus with an ill-defined region of mildly increased echogenicity. This region does not demonstrate sharply demarcated margins to make an accurate measurement of the size but corresponds to the area of heterogeneous enhancement on the CT.  Endometrium  Thickness: 4 mm. No focal endometrial abnormality visualized. Minimal fluid was noted in the endometrial canal.  Right ovary  Measurements: 4.2 x 2.2 x 3.8 cm. Normal appearance/no adnexal mass. Multiple follicles measure up to 1.1 cm.  Left ovary  Measurements: 3.4 x 2.9 x 2.7 cm. Normal appearance/no adnexal mass. Multiple follicles measure up to 1.9 cm.  Other findings  Trace pelvic free fluid was noted.  IMPRESSION: 1. Normal endometrial thickness. 2. Heterogeneous appearance of the uterine fundus. Although a well-defined mass is not clearly identified, the findings on ultrasound and CT are favored to represent a fibroid or possibly adenomyoma. If there is clinical concern for a more ominous process or follow-up is clinically desired, a repeat ultrasound could be performed in 3 months.   Electronically Signed   By: Sebastian Ache   On: 04/22/2014 15:42   US Pelvis Complete  04/22/2014   CLINICAL DATA:  Abnormal appearance of the uterus on CT. Evaluate endometrium.  EXAM: TRANSABDOMINAL AND TRANSVAGINAL ULTRASOUND OF PELVIS  TECHNIQUE: Both transabdominal and transvaginal ultrasound examinations of the pelvis were performed. Transabdominal technique was performed for global imaging of the pelvis including uterus, ovaries, adnexal regions, and pelvic cul-de-sac. It was necessary to proceed with endovaginal exam following the transabdominal exam to visualize the endometrium.  COMPARISON:  CT abdomen and pelvis 04/20/2014  FINDINGS: Uterus  Measurements: 6.5 x 4.2 x  5.8 cm. The myometrium appears mildly thickened in the region of the uterine fundus with an ill-defined region of mildly increased echogenicity. This region does not demonstrate sharply demarcated margins to make an accurate measurement of the size but corresponds to the area of heterogeneous enhancement on the CT.  Endometrium  Thickness: 4 mm. No focal endometrial abnormality visualized. Minimal fluid was noted in the endometrial canal.  Right ovary  Measurements: 4.2 x 2.2 x 3.8 cm. Normal appearance/no adnexal mass. Multiple follicles measure up to 1.1 cm.  Left ovary  Measurements: 3.4 x 2.9 x 2.7 cm. Normal appearance/no adnexal mass. Multiple follicles measure up to 1.9 cm.  Other findings  Trace pelvic free fluid was noted.  IMPRESSION: 1. Normal endometrial thickness. 2. Heterogeneous appearance of the uterine fundus. Although a well-defined mass is not clearly identified, the findings on ultrasound and CT are favored to represent a fibroid or possibly adenomyoma. If there is clinical concern for a more ominous process or follow-up is clinically desired, a repeat ultrasound could be performed in 3 months.   Electronically Signed   By: Sebastian Ache   On: 04/22/2014 15:42    Anti-infectives: Anti-infectives   Start     Dose/Rate Route Frequency Ordered Stop   04/22/14 0900  vancomycin (VANCOCIN) 500 mg in sodium chloride 0.9 %  100 mL IVPB     500 mg 100 mL/hr over 60 Minutes Intravenous Every 8 hours 04/22/14 0728     04/21/14 1400  piperacillin-tazobactam (ZOSYN) IVPB 3.375 g     3.375 g 12.5 mL/hr over 240 Minutes Intravenous 3 times per day 04/21/14 1211     04/20/14 1930  vancomycin (VANCOCIN) IVPB 1000 mg/200 mL premix     1,000 mg 200 mL/hr over 60 Minutes Intravenous  Once 04/20/14 1923 04/20/14 2043   04/20/14 1930  piperacillin-tazobactam (ZOSYN) IVPB 3.375 g     3.375 g 100 mL/hr over 30 Minutes Intravenous  Once 04/20/14 1923 04/20/14 2010       Assessment/Plan  1. Acute  appendicitis, POD 3, s/p lap appy, routine appendicitis, pathology shows just acute appendicitis  2. Bacteremia, E. Coli, gram + cocci  3. Microcytic anemia  4. Low-grade sepsis  5. Fibroid vs adenomyoma   Plan: 1. Cont IV abx therapy. D3 of Zosyn, D2 Vanc (just started yesterday 8/19) 2. tx to floor 3. Tachycardia and tachypnea improved 4. US shows no evidence of endometritis, but concern for fibroid vs adenomyoma.  She will need repeat US in 3 months. 5. Start Fe supplement today.  Still a concern given she denies heavy menses that her anemia is more than just Fe deficiency.  I have spoken to CCM about this concern.  They will consider if any further testing needs to be done. 6. Patient must mobilize today!! 7. Transition to oral pain meds and give miralax to help with a bowel regimen  LOS: 3 days    Clydell Alberts E 04/23/2014, 7:44 AM Pager: [phone]

## 2014-04-23 NOTE — Progress Notes (Signed)
Patient seen and examined.  She is improving.  Anemia work up in progress. Transfer to floor today.

## 2014-04-23 NOTE — Anesthesia Postprocedure Evaluation (Signed)
  Anesthesia Post-op Note  Patient: Tiffany [name]  Procedure(s) Performed: Procedure(s) (LRB): APPENDECTOMY LAPAROSCOPIC (N/A)  Patient Location: PACU  Anesthesia Type: General  Level of Consciousness: awake and alert   Airway and Oxygen Therapy: Patient Spontanous Breathing  Post-op Pain: mild  Post-op Assessment: Post-op Vital signs reviewed, Patient's Cardiovascular Status Stable, Respiratory Function Stable, Patent Airway and No signs of Nausea or vomiting  Last Vitals:  Filed Vitals:   04/23/14 0600  BP: 98/65  Pulse: 98  Temp:   Resp: 20    Post-op Vital Signs: stable   Complications: No apparent anesthesia complications

## 2014-04-24 ENCOUNTER — Telehealth: Payer: Self-pay | Admitting: Hematology and Oncology

## 2014-04-24 ENCOUNTER — Other Ambulatory Visit (INDEPENDENT_AMBULATORY_CARE_PROVIDER_SITE_OTHER): Payer: Self-pay | Admitting: *Deleted

## 2014-04-24 DIAGNOSIS — D509 Iron deficiency anemia, unspecified: Secondary | ICD-10-CM

## 2014-04-24 LAB — BASIC METABOLIC PANEL
Anion gap: 13 (ref 5–15)
BUN: 7 mg/dL (ref 6–23)
CO2: 20 mEq/L (ref 19–32)
CREATININE: 0.52 mg/dL (ref 0.50–1.10)
Calcium: 9 mg/dL (ref 8.4–10.5)
Chloride: 103 mEq/L (ref 96–112)
GFR calc Af Amer: >90 mL/min (ref >90)
Glucose, Bld: 97 mg/dL (ref 70–99)
Potassium: 4.1 mEq/L (ref 3.7–5.3)
Sodium: 136 mEq/L — ABNORMAL LOW (ref 137–147)

## 2014-04-24 LAB — CBC
HCT: 26.5 % — ABNORMAL LOW (ref 36.0–46.0)
HEMOGLOBIN: 8.4 g/dL — AB (ref 12.0–15.0)
MCH: 19.4 pg — AB (ref 26.0–34.0)
MCHC: 31.7 g/dL (ref 30.0–36.0)
MCV: 61.2 fL — ABNORMAL LOW (ref 78.0–100.0)
PLATELETS: 277 10*3/uL (ref 150–400)
RBC: 4.33 MIL/uL (ref 3.87–5.11)
RDW: 17.9 % — ABNORMAL HIGH (ref 11.5–15.5)
WBC: 6.4 10*3/uL (ref 4.0–10.5)

## 2014-04-24 LAB — CULTURE, BLOOD (ROUTINE X 2)

## 2014-04-24 MED ORDER — FERROUS SULFATE 325 (65 FE) MG PO TABS: 325.0000 mg | ORAL_TABLET | Freq: Three times a day (TID) | ORAL | Status: DC

## 2014-04-24 MED ORDER — AMOXICILLIN-POT CLAVULANATE 875-125 MG PO TABS: 1.0000 | ORAL_TABLET | Freq: Two times a day (BID) | ORAL | Status: DC

## 2014-04-24 MED ORDER — OXYCODONE-ACETAMINOPHEN 5-325 MG PO TABS: 1.0000 | ORAL_TABLET | ORAL | Status: DC | PRN

## 2014-04-24 NOTE — Care Management Note (Signed)
    Page 1 of 2   04/24/2014     11:44:43 AM CARE MANAGEMENT NOTE 04/24/2014  Patient:  Tiffany [name],Tiffany [name]   Account Number:  [id number]  Date Initiated:  04/21/2014  Documentation initiated by:  DAVIS,RHONDA  Subjective/Objective Assessment:   acute appendicitis with early sepsis     Action/Plan:   home when stable   Anticipated DC Date:  04/24/2014   Anticipated DC Plan:  HOME/SELF CARE  In-house referral  Financial Counselor      DC Planning Services  CM consult  [facility]igent [facility]  MATCH Program  [facility]P issues      [facility]  [facility]   Choice offered to / List presented to:  NA   DME arranged  NA      DME agency  NA     HH arranged  NA      HH agency  NA   Status of service:  Completed, signed off Medicare Important Message given?  NA - LOS <3 / Initial given by admissions (If response is "NO", the following Medicare IM given date fields will be blank) Date Medicare IM given:   Medicare IM given by:   Date Additional Medicare IM given:   Additional Medicare IM given by:    Discharge Disposition:  HOME/SELF CARE  Per UR Regulation:  Reviewed for med. necessity/level of care/duration of stay  If discussed at Long Length of Stay Meetings, dates discussed:    Comments:  04-24-14 Lorenda IshiharaSuzanne Kalik Hoare RN CM 1140 Discussed with patient and sister need for follow up appt, Medicaid is new and they do not have a card or PCP yet. Discussed options for PCP choice, [facility] and wants to use them. Contacted clinic to arrange appt but they are unable to provide appt today, they requested patient call on Monday to arrange, notified them of language barrier. Discussed with sister who will assist with making the appt, written instructions provided, will need GYN referral per attending as well. Provided my card for contact should they have issues. No further d/c needs, medications are accessible.  Rhonda Davis,RN,BSN,CCM Patient [facility] need appointment  [facility] for [facility] and new pcp/pt will also need gyn if able to get into a free clinic.

## 2014-04-24 NOTE — Telephone Encounter (Signed)
Left message for patient and gave np appt for 08/26 @ 8 w/Dr. Bertis RuddyGorsuch.

## 2014-04-24 NOTE — Discharge Summary (Signed)
Agree with summary. 

## 2014-04-24 NOTE — Discharge Instructions (Signed)
You will need to follow up with a blood doctor (hematologist)  Our office will call you with this appointment You will also need to follow up with a primary doctor as well as a female parts doctor (gynecologist) for a repeat pelvic ultrasound to follow up on possible fibroid vs mass.  [facility] ______[facility] LAPAROSCOPIC SURGERY: POST OP INSTRUCTIONS Always review your discharge instruction sheet given to you by the facility where your surgery was performed. IF YOU HAVE DISABILITY OR FAMILY LEAVE FORMS, YOU MUST BRING THEM TO THE OFFICE FOR PROCESSING.   DO NOT GIVE THEM TO YOUR DOCTOR.  1. A prescription for pain medication may be given to you upon discharge.  Take your pain medication as prescribed, if needed.  If narcotic pain medicine is not needed, then you may take acetaminophen (Tylenol) or ibuprofen (Advil) as needed. 2. Take your usually prescribed medications unless otherwise directed. 3. If you need a refill on your pain medication, please contact your pharmacy.  They will contact our office to request authorization. Prescriptions will not be filled after 5pm or on week-ends. 4. You should follow a light diet the first few days after arrival home, such as soup and crackers, etc.  Be sure to include lots of fluids daily. 5. Most patients will experience some swelling and bruising in the area of the incisions.  Ice packs will help.  Swelling and bruising can take several days to resolve.  6. It is common to experience some constipation if taking pain medication after surgery.  Increasing fluid intake and taking a stool softener (such as Colace) will usually help or prevent this problem from occurring.  A mild laxative (Milk of Magnesia or Miralax) should be taken according to package instructions if there are no bowel movements after 48 hours. 7. Unless discharge instructions indicate otherwise, you may remove your bandages 24-48 hours after surgery, and you may shower at  that time.  You may have steri-strips (small skin tapes) in place directly over the incision.  These strips should be left on the skin for 7-10 days.  If your surgeon used skin glue on the incision, you may shower in 24 hours.  The glue will flake off over the next 2-3 weeks.  Any sutures or staples will be removed at the office during your follow-up visit. 8. ACTIVITIES:  You may resume regular (light) daily activities beginning the next day--such as daily self-care, walking, climbing stairs--gradually increasing activities as tolerated.  You may have sexual intercourse when it is comfortable.  Refrain from any heavy lifting or straining until approved by your doctor. a. You may drive when you are no longer taking prescription pain medication, you can comfortably wear a seatbelt, and you can safely maneuver your car and apply brakes. b. RETURN TO WORK:  __________________________________________________________ 9. You should see your doctor in the office for a follow-up appointment approximately 2-3 weeks after your surgery.  Make sure that you call for this appointment within a day or two after you arrive home to insure a convenient appointment time. 10. OTHER INSTRUCTIONS: __________________________________________________________________________________________________________________________ __________________________________________________________________________________________________________________________ WHEN TO CALL YOUR DOCTOR: 1. Fever over 101.0 2. Inability to urinate 3. Continued bleeding from incision. 4. Increased pain, redness, or drainage from the incision. 5. Increasing abdominal pain  The clinic staff is available to answer your questions during regular business hours.  Please dont hesitate to call and ask to speak to one of the nurses for clinical concerns.  If you have a medical  emergency, go to the nearest emergency room or call 911.  A surgeon from [facility]  is always on call at the hospital. [address], [address], [place], [place]  [address] ? [address] [address], [place], [place]   [address] [phone] ? [phone] ? FAX [phone] Web site: [email or web address]

## 2014-04-24 NOTE — Discharge Summary (Signed)
Patient ID: Tiffany [name] MRN: [id number] DOB/AGE: 34/23/1982 34 y.o.  Admit date: 04/20/2014 Discharge date: 04/24/2014  Procedures: laparoscopic appendectomy  Consults: pulmonary/intensive care  Reason for Admission: pt has had nausea and fever for the past 2 days. Denies sick contacts. Pt from [place] and speaks Cly[place] Braun and some [place].  Admission Diagnoses:  1. Acute appendicitis 2. Hypoxic 3. Tachycardia 4. Tachypnea 5. Early sepsis 6. anemia  Hospital Course: She was admitted and taken to the operating room where she underwent a lap appy.  The patient tolerated this well, but due to persistent tachycardia, tachypnea, and some hypoxia, she was transferred to the SDU postoperatively.  She had some urinary retention and a foley was inserted.  She was restarted on Zosyn.  She had blood cultures drawn that ultimately revealed positive for E.coli and Strep Viridans.  Vancomycin was added.  She was started on clear liquids and kept on this for about 2 days.  She was then advanced to a regular diet.  She was later transferred to the floor on POD 3.  ID was consulted via a phone call who recommended 14 days total of Augmentin to cover her bacteremia.  CCM was also asked to see her on POD 2 given her bacteremia, persistent altered vitals, and anemia.  A CTA of the chest was ordered to rule out a PE given tachycardia and tachypnea.  This was negative.  An anemia panel was done.  This revealed very low iron of <10.  She denied heavy menses, so there is a concern for another form of microcytic anemia.  She will follow up with hematology as an outpatient for further work up.  She was started on TID iron supplements.  Her initial CT showed a concern for a thickened endometrium.  To rule out endometritis, we ordered a pelvic ultrasound.  Her endometrium was normal, but there was a concern for a fibroid vs an adenomyoma.  A follow up US was recommended in 3 months.  The patient was stable on POD 5, for dc  home.  PE: Abd: soft, minimally tender, +BS, ND, incisions are c/d/i Heart: regular Lungs: CTAB    Discharge Diagnoses:  Active Problems:   Appendicitis   Appendicitis, acute Bacteremia, E.coli and Strep Viridans Uterine mass, fibroid vs adenomyoma Sepsis, resolved Urinary retention, resolved Microcytic anemia Dependent pleural effusions, resolved Hypoxemia, resolved Pathology: acute appendicitis with serositis Blood cultures:  E. Coli and Strep Viridans  Discharge Medications:   Medication List         acetaminophen 500 MG tablet  Commonly known as:  TYLENOL  Take 500 mg by mouth every 6 (six) hours as needed for mild pain, fever or headache.     amoxicillin-clavulanate 875-125 MG per tablet  Commonly known as:  AUGMENTIN  Take 1 tablet by mouth 2 (two) times daily.     ferrous sulfate 325 (65 FE) MG tablet  Take 1 tablet (325 mg total) by mouth 3 (three) times daily with meals.     oxyCODONE-acetaminophen 5-325 MG per tablet  Commonly known as:  PERCOCET/ROXICET  Take 1-2 tablets by mouth every 4 (four) hours as needed for moderate pain.        Discharge Instructions: Follow-up Information   Follow up with [facility] On 05/19/2014. (3:15pm, arrive by 2:45pm for paperwork)    Contact information:   [address]   [place] [address] [phone]     You will need to follow up  with a blood doctor (hematologist)  Our office will call you with this appointment You will also need to follow up with a primary doctor as well as a female parts doctor (gynecologist) for a repeat pelvic ultrasound to follow up on possible fibroid vs mass.  Signed: Djeneba [name] E 04/24/2014, 10:17 AM

## 2014-04-29 ENCOUNTER — Ambulatory Visit: Payer: Medicaid Other

## 2014-04-29 ENCOUNTER — Ambulatory Visit: Payer: Medicaid Other | Admitting: Hematology and Oncology

## 2014-05-14 ENCOUNTER — Ambulatory Visit: Payer: Medicaid Other | Admitting: Family Medicine

## 2014-05-15 ENCOUNTER — Other Ambulatory Visit (HOSPITAL_COMMUNITY)
Admission: RE | Admit: 2014-05-15 | Discharge: 2014-05-15 | Disposition: A | Discharge status: Home / Self Care | Payer: Medicaid Other | Source: Ambulatory Visit | Attending: Family Medicine | Admitting: Family Medicine

## 2014-05-15 ENCOUNTER — Ambulatory Visit: Payer: Medicaid Other | Attending: Family Medicine | Admitting: Family Medicine

## 2014-05-15 ENCOUNTER — Encounter: Payer: Self-pay | Admitting: Family Medicine

## 2014-05-15 VITALS — BP 108/74 | HR 98 | Temp 98.2°F | Resp 16 | Ht <= 58 in | Wt 81.1 lb

## 2014-05-15 DIAGNOSIS — A499 Bacterial infection, unspecified: Secondary | ICD-10-CM | POA: Diagnosis not present

## 2014-05-15 DIAGNOSIS — Z1151 Encounter for screening for human papillomavirus (HPV): Secondary | ICD-10-CM | POA: Diagnosis present

## 2014-05-15 DIAGNOSIS — R87810 Cervical high risk human papillomavirus (HPV) DNA test positive: Secondary | ICD-10-CM | POA: Diagnosis present

## 2014-05-15 DIAGNOSIS — N760 Acute vaginitis: Secondary | ICD-10-CM | POA: Insufficient documentation

## 2014-05-15 DIAGNOSIS — D509 Iron deficiency anemia, unspecified: Secondary | ICD-10-CM | POA: Insufficient documentation

## 2014-05-15 DIAGNOSIS — N9489 Other specified conditions associated with female genital organs and menstrual cycle: Secondary | ICD-10-CM | POA: Diagnosis present

## 2014-05-15 DIAGNOSIS — Z01419 Encounter for gynecological examination (general) (routine) without abnormal findings: Secondary | ICD-10-CM | POA: Diagnosis present

## 2014-05-15 DIAGNOSIS — Z113 Encounter for screening for infections with a predominantly sexual mode of transmission: Secondary | ICD-10-CM | POA: Diagnosis present

## 2014-05-15 DIAGNOSIS — B9689 Other specified bacterial agents as the cause of diseases classified elsewhere: Secondary | ICD-10-CM | POA: Insufficient documentation

## 2014-05-15 DIAGNOSIS — N858 Other specified noninflammatory disorders of uterus: Secondary | ICD-10-CM | POA: Insufficient documentation

## 2014-05-15 DIAGNOSIS — K3589 Other acute appendicitis without perforation or gangrene: Secondary | ICD-10-CM

## 2014-05-15 DIAGNOSIS — R630 Anorexia: Secondary | ICD-10-CM | POA: Diagnosis not present

## 2014-05-15 DIAGNOSIS — Z9089 Acquired absence of other organs: Secondary | ICD-10-CM | POA: Diagnosis not present

## 2014-05-15 DIAGNOSIS — Z124 Encounter for screening for malignant neoplasm of cervix: Secondary | ICD-10-CM | POA: Insufficient documentation

## 2014-05-15 DIAGNOSIS — K3580 Unspecified acute appendicitis: Secondary | ICD-10-CM

## 2014-05-15 DIAGNOSIS — D649 Anemia, unspecified: Secondary | ICD-10-CM

## 2014-05-15 DIAGNOSIS — Z681 Body mass index (BMI) 19 or less, adult: Secondary | ICD-10-CM | POA: Diagnosis not present

## 2014-05-15 MED ORDER — DOCUSATE SODIUM 100 MG PO CAPS: 100.0000 mg | ORAL_CAPSULE | Freq: Every day | ORAL | Status: DC | PRN

## 2014-05-15 NOTE — Progress Notes (Signed)
   Subjective:    Patient ID: Tiffany [name], female    DOB: 07-Jun-1981, 33 y.o.   MRN: [id number] CC: establish care, HFU appendicitis, pelvic mass   HPI 33 year old female presents to establish care discussed the following:  1. hospital followup for appendicitis: Patient is here for followup appendicitis is status post lap appendectomy on 04/22/2014. Since appendectomy she reports anorexia and intermittent constipation. She's taking Percocet as needed for pain.   2. IDA: dx in hospital. On oral iron. occasional constipation. Has decreased energy.  3. Uterine mass: dx in hospital 8/15. CT scan with irregular mass, f/u pelvic US with mass. Normal endometrial thickness. Denies intermenstrual bleeding. No fever, chills.   4. HM: due for pap   Soc hx: chronic non smoker  Review of Systems As per HPI     Objective:   Physical Exam BP 108/74  Pulse 98  Temp(Src) 98.2 F (36.8 C) (Oral)  Resp 16  Ht  (1.448 m)  Wt 81 lb 1.6 oz (36.787 kg)  BMI 17.55 kg/m2  SpO2 100%  LMP 05/12/2014 General appearance: alert, cooperative and no distress Abdomen: soft, non-tender; bowel sounds normal; no masses,  no organomegaly, healed laparoscopic sites.  Pelvic: cervix normal in appearance, external genitalia normal, no adnexal masses or tenderness, no cervical motion tenderness, rectovaginal septum normal, uterus normal size, shape, and consistency and vagina normal without discharge     Assessment & Plan:

## 2014-05-15 NOTE — Patient Instructions (Signed)
Mrs. Gunnels,  Thank you for coming in today. It was a pleasure meeting you. I look forward to be a primary doctor.  1. I have placed a referral to gynecology due to you uterine mass. You will be called with appt details.   2. Anemia: continue iron. Repeat CBC today.  3. Pap smear: done today. You will be called with results.  Dr. Armen Pickup

## 2014-05-15 NOTE — Assessment & Plan Note (Signed)
Resolved doing well post op Stool softener prn

## 2014-05-15 NOTE — Assessment & Plan Note (Addendum)
A: noted during recent hospitalization on iron therapy P: F/u CBC Continue iron therapy.

## 2014-05-15 NOTE — Assessment & Plan Note (Signed)
A: uterine mass on CT scan, normal endometrial thickness. No intermenstrual bleeding. Some malaise, weight loss, abdominal pain but patient is post op from acute appendicitis. I suspect that a f/u pelvic ultrasound in 3 months from the last is all that is needed. I will refer to gyn for second opinion in case endometrial biopsy recommended given appearance of mass.   P: Gyn referral  Pap and Gc/chlam, wet prep done today

## 2014-05-15 NOTE — Assessment & Plan Note (Signed)
Pap done today  

## 2014-05-15 NOTE — Progress Notes (Signed)
Establish Care HFU Pt stated was in the hospital due to stomach ache Complaining of decrease appetite and feeling tired

## 2014-05-18 LAB — CBC WITH DIFFERENTIAL/PLATELET
BASOS ABS: 0 10*3/uL (ref 0.0–0.1)
Basophils Relative: 1 % (ref 0–1)
Eosinophils Absolute: 0.3 10*3/uL (ref 0.0–0.7)
Eosinophils Relative: 7 % — ABNORMAL HIGH (ref 0–5)
HEMATOCRIT: 33.4 % — AB (ref 36.0–46.0)
Hemoglobin: 10.7 g/dL — ABNORMAL LOW (ref 12.0–15.0)
Lymphocytes Relative: 50 % — ABNORMAL HIGH (ref 12–46)
Lymphs Abs: 2.4 10*3/uL (ref 0.7–4.0)
MCH: 22.3 pg — AB (ref 26.0–34.0)
MCHC: 32 g/dL (ref 30.0–36.0)
MCV: 69.7 fL — ABNORMAL LOW (ref 78.0–100.0)
Monocytes Absolute: 0.4 10*3/uL (ref 0.1–1.0)
Monocytes Relative: 8 % (ref 3–12)
NEUTROS ABS: 1.6 10*3/uL — AB (ref 1.7–7.7)
Neutrophils Relative %: 34 % — ABNORMAL LOW (ref 43–77)
Platelets: 370 10*3/uL (ref 150–400)
RBC: 4.79 MIL/uL (ref 3.87–5.11)
RDW: 27.4 % — AB (ref 11.5–15.5)
WBC: 4.8 10*3/uL (ref 4.0–10.5)

## 2014-05-19 ENCOUNTER — Encounter (INDEPENDENT_AMBULATORY_CARE_PROVIDER_SITE_OTHER): Payer: Self-pay

## 2014-05-19 DIAGNOSIS — N760 Acute vaginitis: Secondary | ICD-10-CM

## 2014-05-19 DIAGNOSIS — B9689 Other specified bacterial agents as the cause of diseases classified elsewhere: Secondary | ICD-10-CM | POA: Insufficient documentation

## 2014-05-19 LAB — CYTOLOGY - PAP

## 2014-05-19 MED ORDER — METRONIDAZOLE 500 MG PO TABS: 500.0000 mg | ORAL_TABLET | Freq: Two times a day (BID) | ORAL | Status: DC

## 2014-05-19 MED ORDER — FLUCONAZOLE 150 MG PO TABS: 150.0000 mg | ORAL_TABLET | Freq: Once | ORAL | Status: DC

## 2014-05-19 NOTE — Assessment & Plan Note (Signed)
Positive BV wet prep. Sent in Flagyl 500 mg twice daily for 7 days followed by Diflucan 150 mg x1.

## 2014-05-19 NOTE — Addendum Note (Signed)
Addended by: Dessa Phi on: 05/19/2014 02:57 PM   Modules accepted: Orders

## 2014-05-25 ENCOUNTER — Telehealth: Payer: Self-pay | Admitting: *Deleted

## 2014-05-25 NOTE — Telephone Encounter (Signed)
Left voice massage to return call 

## 2014-05-25 NOTE — Telephone Encounter (Signed)
Pt aware of pap smear results 

## 2014-05-25 NOTE — Telephone Encounter (Signed)
Left massage to return call 

## 2014-05-25 NOTE — Telephone Encounter (Signed)
Message copied by Dyann Kief on Mon May 25, 2014  2:46 PM ------      Message from: Dessa Phi      Created: Tue May 19, 2014  2:55 PM       Positive BV wet prep. Sent in Flagyl 500 mg twice daily for 7 days followed by Diflucan 150 mg x1.      Negative GC chlamydia.      Hemoglobin improved, on the rise: Continue oral iron.      Awaiting Pap smear results. ------

## 2014-05-25 NOTE — Telephone Encounter (Signed)
Pt aware of results, will pick up Rx today 

## 2014-05-25 NOTE — Telephone Encounter (Signed)
Message copied by Dyann Kief on Mon May 25, 2014  2:49 PM ------      Message from: Dessa Phi      Created: Thu May 21, 2014  1:48 PM       Negative pap, repeat in 3 years. ------

## 2014-07-01 ENCOUNTER — Encounter: Payer: Medicaid Other | Admitting: Obstetrics and Gynecology

## 2014-08-21 ENCOUNTER — Ambulatory Visit (INDEPENDENT_AMBULATORY_CARE_PROVIDER_SITE_OTHER): Payer: Medicaid Other | Admitting: Family Medicine

## 2014-08-21 ENCOUNTER — Encounter: Payer: Self-pay | Admitting: Family Medicine

## 2014-08-21 VITALS — BP 117/77 | HR 93 | Temp 98.3°F | Ht <= 58 in | Wt 81.0 lb

## 2014-08-21 DIAGNOSIS — IMO0001 Reserved for inherently not codable concepts without codable children: Secondary | ICD-10-CM

## 2014-08-21 DIAGNOSIS — N858 Other specified noninflammatory disorders of uterus: Secondary | ICD-10-CM

## 2014-08-21 LAB — POCT URINALYSIS DIP (DEVICE)
BILIRUBIN URINE: NEGATIVE
Glucose, UA: NEGATIVE mg/dL
Hgb urine dipstick: NEGATIVE
Ketones, ur: NEGATIVE mg/dL
LEUKOCYTES UA: NEGATIVE
Nitrite: NEGATIVE
PROTEIN: NEGATIVE mg/dL
Specific Gravity, Urine: 1.015 (ref 1.005–1.030)
Urobilinogen, UA: 0.2 mg/dL (ref 0.0–1.0)
pH: 6 (ref 5.0–8.0)

## 2014-08-21 NOTE — Progress Notes (Signed)
   Subjective:    Patient ID: Tiffany [name], female    DOB: 09/02/1981, 33 y.o.   MRN: [id number]  HPI 33 yo [id number] here for abnormality on CT and US, suggestive of fibroid or adenomyosis.  She reports normal periods every 28-30 days with 1 day of bleeding, which has been her normal.  She reports no pelvic pain or irregular or intermenstrual bleeding.  She does have incomplete bladder emptying and discomfort when she urinates, which started after having her appendectomy.   Review of Systems  Constitutional: Negative for fever, chills and fatigue.  Respiratory: Negative for cough, shortness of breath and wheezing.   Gastrointestinal: Negative for nausea, vomiting, abdominal pain, diarrhea and constipation.  Genitourinary: Positive for frequency. Negative for urgency, hematuria, decreased urine volume, vaginal bleeding, vaginal discharge, vaginal pain, menstrual problem and pelvic pain.  Neurological: Negative for dizziness, light-headedness, numbness and headaches.       Objective:   Physical Exam  Constitutional: She is oriented to person, place, and time. She appears well-developed and well-nourished.  Cardiovascular: Normal rate, regular rhythm and normal heart sounds.  Exam reveals no gallop and no friction rub.   No murmur heard. Pulmonary/Chest: Effort normal and breath sounds normal. No respiratory distress. She has no wheezes. She has no rales. She exhibits no tenderness.  Abdominal: Soft. Bowel sounds are normal. She exhibits no distension and no mass. There is no tenderness. There is no rebound and no guarding.  Genitourinary: No labial fusion. There is no rash, tenderness, lesion or injury on the right labia. There is no rash, tenderness, lesion or injury on the left labia. Uterus is enlarged (6week size). Uterus is not deviated, not fixed and not tender. Cervix exhibits no motion tenderness and no friability. Right adnexum displays no mass, no tenderness and no fullness. Left adnexum  displays no mass, no tenderness and no fullness. No erythema, tenderness or bleeding in the vagina. No foreign body around the vagina. No signs of injury around the vagina. No vaginal discharge found.  Neurological: She is alert and oriented to person, place, and time.  Skin: Skin is warm and dry.  Psychiatric: She has a normal mood and affect. Her behavior is normal. Judgment and thought content normal.      Assessment & Plan:   Problem List Items Addressed This Visit    Uterine mass - Primary    Other Visit Diagnoses    Frequency          Discussed with the patient that the uterine mass is likely fibroid or adenomyosis.  As the patient is asymptomatic, there is no further treatment or evaluation that needs to be done for this.  I discussed this with the patient, as well as indications for return.    I preformed a urinalysis today, which did not show any evidence of infection.  Her urinary symptoms are not explained by any uterine or vaginal problems that I am able to see - there is no major cystocele.  She may benefit from urologic evaluation.  Thank you for your referral.

## 2014-08-21 NOTE — Progress Notes (Signed)
Referred her for uterine mass. Used Chemical engineernterpreter Win [employer]

## 2018-10-03 ENCOUNTER — Emergency Department (HOSPITAL_COMMUNITY): Payer: Medicaid Other

## 2018-10-03 ENCOUNTER — Emergency Department (HOSPITAL_COMMUNITY)
Admission: EM | Admit: 2018-10-03 | Discharge: 2018-10-03 | Disposition: A | Discharge status: Home / Self Care | Payer: Medicaid Other | Attending: Emergency Medicine | Admitting: Emergency Medicine

## 2018-10-03 DIAGNOSIS — Y999 Unspecified external cause status: Secondary | ICD-10-CM | POA: Insufficient documentation

## 2018-10-03 DIAGNOSIS — Y939 Activity, unspecified: Secondary | ICD-10-CM | POA: Diagnosis not present

## 2018-10-03 DIAGNOSIS — Y929 Unspecified place or not applicable: Secondary | ICD-10-CM | POA: Insufficient documentation

## 2018-10-03 DIAGNOSIS — S5011XA Contusion of right forearm, initial encounter: Secondary | ICD-10-CM

## 2018-10-03 DIAGNOSIS — S59911A Unspecified injury of right forearm, initial encounter: Secondary | ICD-10-CM | POA: Diagnosis present

## 2018-10-03 MED ORDER — CYCLOBENZAPRINE HCL 10 MG PO TABS: 10.0000 mg | ORAL_TABLET | Freq: Two times a day (BID) | ORAL | 0 refills | Status: DC | PRN

## 2018-10-03 MED ORDER — IBUPROFEN 400 MG PO TABS
600.0000 mg | ORAL_TABLET | Freq: Once | ORAL | Status: AC
  Administered 2018-10-03: 600 mg via ORAL
  Filled 2018-10-03: qty 1

## 2018-10-03 MED ORDER — IBUPROFEN 600 MG PO TABS: 600.0000 mg | ORAL_TABLET | Freq: Four times a day (QID) | ORAL | 0 refills | Status: DC | PRN

## 2018-10-03 NOTE — ED Notes (Signed)
ED Provider at bedside. 

## 2018-10-03 NOTE — ED Notes (Signed)
Patient verbalizes understanding of discharge instructions. Opportunity for questioning and answers were provided. Armband removed by staff, pt discharged from ED. Prescriptions and pharmacy reviewed. 
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
## 2018-10-03 NOTE — Discharge Instructions (Signed)
Return to the emergency department with any worsening symptoms or new concerns. °

## 2019-01-03 DIAGNOSIS — U071 COVID-19: Secondary | ICD-10-CM

## 2019-01-03 HISTORY — DX: COVID-19: U07.1

## 2019-01-06 DIAGNOSIS — Z3009 Encounter for other general counseling and advice on contraception: Secondary | ICD-10-CM | POA: Diagnosis not present

## 2019-01-06 DIAGNOSIS — Z3200 Encounter for pregnancy test, result unknown: Secondary | ICD-10-CM | POA: Diagnosis not present

## 2019-01-15 ENCOUNTER — Emergency Department (HOSPITAL_COMMUNITY)
Admission: EM | Admit: 2019-01-15 | Discharge: 2019-01-15 | Disposition: A | Discharge status: Home / Self Care | Payer: HRSA Program | Attending: Emergency Medicine | Admitting: Emergency Medicine

## 2019-01-15 ENCOUNTER — Other Ambulatory Visit: Payer: Self-pay

## 2019-01-15 DIAGNOSIS — U071 COVID-19: Secondary | ICD-10-CM | POA: Insufficient documentation

## 2019-01-15 DIAGNOSIS — O26891 Other specified pregnancy related conditions, first trimester: Secondary | ICD-10-CM | POA: Diagnosis not present

## 2019-01-15 DIAGNOSIS — Z20828 Contact with and (suspected) exposure to other viral communicable diseases: Secondary | ICD-10-CM | POA: Diagnosis not present

## 2019-01-15 DIAGNOSIS — R05 Cough: Secondary | ICD-10-CM | POA: Diagnosis not present

## 2019-01-15 DIAGNOSIS — B349 Viral infection, unspecified: Secondary | ICD-10-CM | POA: Diagnosis not present

## 2019-01-15 DIAGNOSIS — R0981 Nasal congestion: Secondary | ICD-10-CM | POA: Diagnosis not present

## 2019-01-15 DIAGNOSIS — R509 Fever, unspecified: Secondary | ICD-10-CM | POA: Insufficient documentation

## 2019-01-15 DIAGNOSIS — M7918 Myalgia, other site: Secondary | ICD-10-CM | POA: Insufficient documentation

## 2019-01-15 NOTE — ED Triage Notes (Signed)
Son has tested positive for Covid and has quarantined at home.  Employer has sent her here to be tested.  3 day hx of cough, fever, headache, sore throat.

## 2019-01-15 NOTE — ED Provider Notes (Signed)
[facility] EMERGENCY DEPARTMENT Provider Note   CSN: [id number] Arrival date & time: 01/15/19  1427    History   Chief Complaint Chief Complaint  Patient presents with  . Fever    HPI Tiffany [name] is a 38 y.o. female with a history of anemia who is currently 2 months pregnant (pregnancy uncomplicated thus far) who presents to the emergency department requesting COVID-19 testing per her employer.  She states that she has had subjective fevers, congestion, body aches, runny nose, and mild nonproductive cough.  No alleviating or aggravating factors.  No intervention prior to arrival.  Her son recently has tested positive for COVID-19 who she has been in contact with. Husband also sick w/ similar sxs. Given her symptoms and known exposure her employer requested she come to the emergency department for testing.  Denies shortness of breath, chest pain, leg pain/swelling, abdominal pain, vaginal bleeding, vomiting, or diarrhea.  Translator line utilized throughout Audiological scientist.    HPI  Past Medical History:  Diagnosis Date  . Appendicitis     Patient Active Problem List   Diagnosis Date Noted  . BV (bacterial vaginosis) 05/19/2014  . IDA (iron deficiency anemia) 05/15/2014  . Cervical cancer screening 05/15/2014  . Uterine mass 05/15/2014    Past Surgical History:  Procedure Laterality Date  . LAPAROSCOPIC APPENDECTOMY N/A 04/20/2014   Procedure: APPENDECTOMY LAPAROSCOPIC;  Surgeon: Romie Levee, MD;  Location: [facility];  Service: General;  Laterality: N/A;     OB History   No obstetric history on file.      Home Medications    Prior to Admission medications   Medication Sig Start Date End Date Taking? Authorizing Provider  cyclobenzaprine (FLEXERIL) 10 MG tablet Take 1 tablet (10 mg total) by mouth 2 (two) times Tiffany as needed for muscle spasms. 10/03/18   Elpidio Anis, PA-C  ferrous sulfate 325 (65 FE) MG tablet Take 1 tablet (325 mg total) by mouth 3 (three)  times Tiffany with meals. 04/24/14   Barnetta Chapel, PA-C  ibuprofen (ADVIL,MOTRIN) 600 MG tablet Take 1 tablet (600 mg total) by mouth every 6 (six) hours as needed. 10/03/18   Elpidio Anis, PA-C    Family History Family History  Problem Relation Age of Onset  . Heart disease Father   . Cancer Neg Hx     Social History Social History   Tobacco Use  . Smoking status: Never Smoker  . Smokeless tobacco: Never Used  Substance Use Topics  . Alcohol use: No  . Drug use: No     Allergies   Patient has no known allergies.   Review of Systems Review of Systems  Constitutional: Positive for fever. Negative for chills.  HENT: Positive for congestion. Negative for ear pain and sore throat.   Respiratory: Positive for cough. Negative for shortness of breath.   Cardiovascular: Negative for chest pain, palpitations and leg swelling.  Gastrointestinal: Negative for abdominal pain, diarrhea and vomiting.  Genitourinary: Negative for vaginal bleeding.       + for current pregnancy.   Musculoskeletal:       + generalized body aches.    Physical Exam Updated Vital Signs BP 96/68 (BP Location: Right Arm)   Pulse (!) 105   Temp 98.9 F (37.2 C) (Oral)   SpO2 100%  RR 16 breaths per minute.   Physical Exam Vitals signs and nursing note reviewed.  Constitutional:      General: She is not in acute distress.  Appearance: She is well-developed.  HENT:     Head: Normocephalic and atraumatic.     Right Ear: Ear canal normal. Tympanic membrane is not perforated, erythematous, retracted or bulging.     Left Ear: Ear canal normal. Tympanic membrane is not perforated, erythematous, retracted or bulging.     Ears:     Comments: No mastoid erythema/swelling/tenderness.     Nose: Congestion present.     Right Sinus: No maxillary sinus tenderness or frontal sinus tenderness.     Left Sinus: No maxillary sinus tenderness or frontal sinus tenderness.     Mouth/Throat:     Pharynx: Uvula  midline. No oropharyngeal exudate or posterior oropharyngeal erythema.     Comments: Posterior oropharynx is symmetric appearing. Patient tolerating own secretions without difficulty. No trismus. No drooling. No hot potato voice. No swelling beneath the tongue, submandibular compartment is soft.  Eyes:     General:        Right eye: No discharge.        Left eye: No discharge.     Conjunctiva/sclera: Conjunctivae normal.     Pupils: Pupils are equal, round, and reactive to light.  Neck:     Musculoskeletal: Normal range of motion and neck supple. No edema or neck rigidity.  Cardiovascular:     Rate and Rhythm: Regular rhythm. Tachycardia present.     Heart sounds: No murmur.  Pulmonary:     Effort: Pulmonary effort is normal. No respiratory distress.     Breath sounds: Normal breath sounds. No wheezing, rhonchi or rales.     Comments: Respirations even and unlabored.  Speaking in complete sentences without difficulty. Abdominal:     General: There is no distension.     Palpations: Abdomen is soft.     Tenderness: There is no abdominal tenderness.  Lymphadenopathy:     Cervical: No cervical adenopathy.  Skin:    General: Skin is warm and dry.     Findings: No rash.  Neurological:     Mental Status: She is alert.  Psychiatric:        Behavior: Behavior normal.    ED Treatments / Results  Labs (all labs ordered are listed, but only abnormal results are displayed) Labs Reviewed  NOVEL CORONAVIRUS, NAA (HOSPITAL ORDER, SEND-OUT TO REF LAB)    EKG None  Radiology No results found.  Procedures Procedures (including critical care time)  Medications Ordered in ED Medications - No data to display   Initial Impression / Assessment and Plan / ED Course  I have reviewed the triage vital signs and the nursing notes.  Pertinent labs & imaging results that were available during my care of the patient were reviewed by me and considered in my medical decision making (see chart  for details).   Patient is currently 2 months pregnant and presents to the emergency department requesting COVID-19 testing per her employer.  She has had symptoms including fever, cough, congestion, generalized body aches.  Known sick contact with her son who tested positive recently.  On exam she is nontoxic-appearing, no apparent distress, vitals WNL with the exception of her mild tachycardia.  No meningismus.  No signs of AOM/mastoiditis.  Posterior oropharynx is clear, doubt strep.  Her lungs are clear to auscultation bilaterally, doubt pneumonia.  No wheezing noted.  No pregnancy related complaints today.  Suspect her symptoms are viral in nature, COVID-19 is likely given her positive sick contact.  We will perform [facility] send out test as patient does  not appear to require admission at this time- she has no signs of respiratory distress, maintaining SpO2 on RA @ rest and w/ ambulation.  Discussed need for quarantine as well as supportive care with primary care follow-up. I discussed treatment plan, need for follow-up, and return precautions with the patient. Provided opportunity for questions, patient confirmed understanding and is in agreement with plan.   Tiffany [name] was evaluated in Emergency Department on 01/15/2019 for the symptoms described in the history of present illness. He/she was evaluated in the context of the global COVID-19 pandemic, which necessitated consideration that the patient might be at risk for infection with the SARS-CoV-2 virus that causes COVID-19. Institutional protocols and algorithms that pertain to the evaluation of patients at risk for COVID-19 are in a state of rapid change based on information released by regulatory bodies including the CDC and federal and state organizations. These policies and algorithms were followed during the patient's care in the ED.   Final Clinical Impressions(s) / ED Diagnoses   Final diagnoses:  Viral illness    ED Discharge Orders    None        Cherly Anderson, PA-C 01/15/19 1526    Benjiman Core, MD 01/16/19 1534

## 2019-01-15 NOTE — Discharge Instructions (Addendum)
You are seen in the emergency department today for fever.  You tested you for coronavirus, this will result within the next 24 to 72 hours.  You need to log onto my chart to be able to see these results. You will need to quarantine for 14 days. You may be able to discontinue self quarantine if the following conditions are met:   Persons with COVID-19 who have symptoms and were directed to care for themselves at home may discontinue home isolation under the  following conditions: - It has been at least 7 days have passed since symptoms first appeared. - AND at least 3 days (72 hours) have passed since recovery defined as resolution of fever without the use of fever-reducing medications and improvement in respiratory symptoms (e.g., cough, shortness of breath)  Please follow the below quarantine instructions.   Please follow up with primary care within 3-5 days for re-evaluation- call prior to going to the office to make them aware of your symptoms. Return to the ER for new or worsening symptoms including but not limited to increased work of breathing, fever, chest pain, passing out, or any other concerns.       Person Under Monitoring Name: Tiffany [name]  Location: [address] [place] [address]   Infection Prevention Recommendations for Individuals Confirmed to have, or Being Evaluated for, 2019 Novel Coronavirus (COVID-19) Infection Who Receive Care at Home  Individuals who are confirmed to have, or are being evaluated for, COVID-19 should follow the prevention steps below until a healthcare provider or local or state health department says they can return to normal activities.  Stay home except to get medical care You should restrict activities outside your home, except for getting medical care. Do not go to work, school, or public areas, and do not use public transportation or taxis.  Call ahead before visiting your doctor Before your medical appointment, call the healthcare  provider and tell them that you have, or are being evaluated for, COVID-19 infection. This will help the healthcare providers office take steps to keep other people from getting infected. Ask your healthcare provider to call the local or state health department.  Monitor your symptoms Seek prompt medical attention if your illness is worsening (e.g., difficulty breathing). Before going to your medical appointment, call the healthcare provider and tell them that you have, or are being evaluated for, COVID-19 infection. Ask your healthcare provider to call the local or state health department.  Wear a facemask You should wear a facemask that covers your nose and mouth when you are in the same room with other people and when you visit a healthcare provider. People who live with or visit you should also wear a facemask while they are in the same room with you.  Separate yourself from other people in your home As much as possible, you should stay in a different room from other people in your home. Also, you should use a separate bathroom, if available.  Avoid sharing household items You should not share dishes, drinking glasses, cups, eating utensils, towels, bedding, or other items with other people in your home. After using these items, you should wash them thoroughly with soap and water.  Cover your coughs and sneezes Cover your mouth and nose with a tissue when you cough or sneeze, or you can cough or sneeze into your sleeve. Throw used tissues in a lined trash can, and immediately wash your hands with soap and water for at least 20 seconds or  use an alcohol-based hand rub.  Wash your [facility] your hands often and thoroughly with soap and water for at least 20 seconds. You can use an alcohol-based hand sanitizer if soap and water are not available and if your hands are not visibly dirty. Avoid touching your eyes, nose, and mouth with unwashed hands.   Prevention Steps for Caregivers  and Household Members of Individuals Confirmed to have, or Being Evaluated for, COVID-19 Infection Being Cared for in the Home  If you live with, or provide care at home for, a person confirmed to have, or being evaluated for, COVID-19 infection please follow these guidelines to prevent infection:  Follow healthcare providers instructions Make sure that you understand and can help the patient follow any healthcare provider instructions for all care.  Provide for the patients basic needs You should help the patient with basic needs in the home and provide support for getting groceries, prescriptions, and other personal needs.  Monitor the patients symptoms If they are getting sicker, call his or her medical provider and tell them that the patient has, or is being evaluated for, COVID-19 infection. This will help the healthcare providers office take steps to keep other people from getting infected. Ask the healthcare provider to call the local or state health department.  Limit the number of people who have contact with the patient If possible, have only one caregiver for the patient. Other household members should stay in another home or place of residence. If this is not possible, they should stay in another room, or be separated from the patient as much as possible. Use a separate bathroom, if available. Restrict visitors who do not have an essential need to be in the home.  Keep older adults, very young children, and other sick people away from the patient Keep older adults, very young children, and those who have compromised immune systems or chronic health conditions away from the patient. This includes people with chronic heart, lung, or kidney conditions, diabetes, and cancer.  Ensure good ventilation Make sure that shared spaces in the home have good air flow, such as from an air conditioner or an opened window, weather permitting.  Wash your hands often Wash your hands  often and thoroughly with soap and water for at least 20 seconds. You can use an alcohol based hand sanitizer if soap and water are not available and if your hands are not visibly dirty. Avoid touching your eyes, nose, and mouth with unwashed hands. Use disposable paper towels to dry your hands. If not available, use dedicated cloth towels and replace them when they become wet.  Wear a facemask and gloves Wear a disposable facemask at all times in the room and gloves when you touch or have contact with the patients blood, body fluids, and/or secretions or excretions, such as sweat, saliva, sputum, nasal mucus, vomit, urine, or feces.  Ensure the mask fits over your nose and mouth tightly, and do not touch it during use. Throw out disposable facemasks and gloves after using them. Do not reuse. Wash your hands immediately after removing your facemask and gloves. If your personal clothing becomes contaminated, carefully remove clothing and launder. Wash your hands after handling contaminated clothing. Place all used disposable facemasks, gloves, and other waste in a lined container before disposing them with other household waste. Remove gloves and wash your hands immediately after handling these items.  Do not share dishes, glasses, or other household items with the patient Avoid sharing household items.  You should not share dishes, drinking glasses, cups, eating utensils, towels, bedding, or other items with a patient who is confirmed to have, or being evaluated for, COVID-19 infection. After the person uses these items, you should wash them thoroughly with soap and water.  Wash laundry thoroughly Immediately remove and wash clothes or bedding that have blood, body fluids, and/or secretions or excretions, such as sweat, saliva, sputum, nasal mucus, vomit, urine, or feces, on them. Wear gloves when handling laundry from the patient. Read and follow directions on labels of laundry or clothing items  and detergent. In general, wash and dry with the warmest temperatures recommended on the label.  Clean all areas the individual has used often Clean all touchable surfaces, such as counters, tabletops, doorknobs, bathroom fixtures, toilets, phones, keyboards, tablets, and bedside tables, every day. Also, clean any surfaces that may have blood, body fluids, and/or secretions or excretions on them. Wear gloves when cleaning surfaces the patient has come in contact with. Use a diluted bleach solution (e.g., dilute bleach with 1 part bleach and 10 parts water) or a household disinfectant with a label that says EPA-registered for coronaviruses. To make a bleach solution at home, add 1 tablespoon of bleach to 1 quart (4 cups) of water. For a larger supply, add  cup of bleach to 1 gallon (16 cups) of water. Read labels of cleaning products and follow recommendations provided on product labels. Labels contain instructions for safe and effective use of the cleaning product including precautions you should take when applying the product, such as wearing gloves or eye protection and making sure you have good ventilation during use of the product. Remove gloves and wash hands immediately after cleaning.  Monitor yourself for signs and symptoms of illness Caregivers and household members are considered close contacts, should monitor their health, and will be asked to limit movement outside of the home to the extent possible. Follow the monitoring steps for close contacts listed on the symptom monitoring form.   ? If you have additional questions, contact your local health department or call the epidemiologist on call at [phone] (available 24/7). ? This guidance is subject to change. For the most up-to-date guidance from [facility], please refer to their website: [email or web address]

## 2019-01-16 LAB — NOVEL CORONAVIRUS, NAA (HOSP ORDER, SEND-OUT TO REF LAB; TAT 18-24 HRS): SARS-CoV-2, NAA: DETECTED — AB

## 2019-03-10 ENCOUNTER — Telehealth: Payer: Self-pay | Admitting: Family Medicine

## 2019-03-10 DIAGNOSIS — Z803 Family history of malignant neoplasm of breast: Secondary | ICD-10-CM | POA: Diagnosis not present

## 2019-03-10 DIAGNOSIS — Z789 Other specified health status: Secondary | ICD-10-CM | POA: Diagnosis not present

## 2019-03-10 DIAGNOSIS — Z3009 Encounter for other general counseling and advice on contraception: Secondary | ICD-10-CM | POA: Diagnosis not present

## 2019-03-10 DIAGNOSIS — Z3482 Encounter for supervision of other normal pregnancy, second trimester: Secondary | ICD-10-CM | POA: Diagnosis not present

## 2019-03-10 DIAGNOSIS — O09519 Supervision of elderly primigravida, unspecified trimester: Secondary | ICD-10-CM | POA: Diagnosis not present

## 2019-03-10 DIAGNOSIS — Z0389 Encounter for observation for other suspected diseases and conditions ruled out: Secondary | ICD-10-CM | POA: Diagnosis not present

## 2019-03-10 DIAGNOSIS — Z3689 Encounter for other specified antenatal screening: Secondary | ICD-10-CM | POA: Diagnosis not present

## 2019-03-10 DIAGNOSIS — U071 COVID-19: Secondary | ICD-10-CM | POA: Diagnosis not present

## 2019-03-10 DIAGNOSIS — Z363 Encounter for antenatal screening for malformations: Secondary | ICD-10-CM | POA: Diagnosis not present

## 2019-03-10 DIAGNOSIS — Z36 Encounter for antenatal screening for chromosomal anomalies: Secondary | ICD-10-CM | POA: Diagnosis not present

## 2019-03-10 DIAGNOSIS — Z1388 Encounter for screening for disorder due to exposure to contaminants: Secondary | ICD-10-CM | POA: Diagnosis not present

## 2019-03-10 LAB — OB RESULTS CONSOLE GC/CHLAMYDIA
Chlamydia: NEGATIVE
Gonorrhea: NEGATIVE

## 2019-03-10 LAB — OB RESULTS CONSOLE RUBELLA ANTIBODY, IGM: Rubella: IMMUNE

## 2019-03-10 LAB — OB RESULTS CONSOLE VARICELLA ZOSTER ANTIBODY, IGG: Varicella: IMMUNE

## 2019-03-10 LAB — AMB REFERRAL TO OB-GYN
Drug Screen, Urine: NEGATIVE
Glucose, 1 hour: 192
Pap: NEGATIVE
Urine Culture, OB: NEGATIVE

## 2019-03-10 LAB — OB RESULTS CONSOLE HEPATITIS B SURFACE ANTIGEN: Hepatitis B Surface Ag: NEGATIVE

## 2019-03-10 LAB — OB RESULTS CONSOLE HIV ANTIBODY (ROUTINE TESTING): HIV: NONREACTIVE

## 2019-03-10 LAB — OB RESULTS CONSOLE RPR: RPR: NONREACTIVE

## 2019-03-10 LAB — OB RESULTS CONSOLE ANTIBODY SCREEN: Antibody Screen: NEGATIVE

## 2019-03-10 LAB — OB RESULTS CONSOLE ABO/RH: RH Type: POSITIVE

## 2019-03-10 LAB — PR SPEC REPORTS > USUAL MED COMUNICAJ/STAND RPRTG: Pap: NEGATIVE

## 2019-03-10 NOTE — Telephone Encounter (Signed)
Tamika from [facility] called to get patient scheduled for new ob appointment. Patient was scheduled for 7/22 @ 8:35.

## 2019-03-26 ENCOUNTER — Ambulatory Visit (INDEPENDENT_AMBULATORY_CARE_PROVIDER_SITE_OTHER): Payer: Medicaid Other | Admitting: Obstetrics and Gynecology

## 2019-03-26 ENCOUNTER — Encounter: Payer: Self-pay | Admitting: Obstetrics and Gynecology

## 2019-03-26 ENCOUNTER — Other Ambulatory Visit: Payer: Self-pay

## 2019-03-26 VITALS — BP 104/68 | HR 90 | Temp 98.2°F | Wt 102.0 lb

## 2019-03-26 DIAGNOSIS — O09529 Supervision of elderly multigravida, unspecified trimester: Secondary | ICD-10-CM

## 2019-03-26 DIAGNOSIS — O09522 Supervision of elderly multigravida, second trimester: Secondary | ICD-10-CM

## 2019-03-26 DIAGNOSIS — O99012 Anemia complicating pregnancy, second trimester: Secondary | ICD-10-CM

## 2019-03-26 DIAGNOSIS — O99019 Anemia complicating pregnancy, unspecified trimester: Secondary | ICD-10-CM | POA: Diagnosis not present

## 2019-03-26 DIAGNOSIS — O24419 Gestational diabetes mellitus in pregnancy, unspecified control: Secondary | ICD-10-CM | POA: Diagnosis not present

## 2019-03-26 DIAGNOSIS — O0990 Supervision of high risk pregnancy, unspecified, unspecified trimester: Secondary | ICD-10-CM

## 2019-03-26 DIAGNOSIS — R772 Abnormality of alphafetoprotein: Secondary | ICD-10-CM | POA: Insufficient documentation

## 2019-03-26 DIAGNOSIS — O09513 Supervision of elderly primigravida, third trimester: Secondary | ICD-10-CM

## 2019-03-26 DIAGNOSIS — U071 COVID-19: Secondary | ICD-10-CM

## 2019-03-26 DIAGNOSIS — Z789 Other specified health status: Secondary | ICD-10-CM | POA: Insufficient documentation

## 2019-03-26 DIAGNOSIS — O0992 Supervision of high risk pregnancy, unspecified, second trimester: Secondary | ICD-10-CM

## 2019-03-26 MED ORDER — PREPLUS 27-1 MG PO TABS: 1.0000 | ORAL_TABLET | Freq: Every day | ORAL | 3 refills | Status: DC

## 2019-03-26 MED ORDER — ACCU-CHEK FASTCLIX LANCETS MISC: 1.0000 | Freq: Four times a day (QID) | 12 refills | Status: DC

## 2019-03-26 MED ORDER — ACCU-CHEK GUIDE W/DEVICE KIT: 1.0000 | PACK | Freq: Four times a day (QID) | 0 refills | Status: DC

## 2019-03-26 MED ORDER — ACCU-CHEK GUIDE VI STRP: ORAL_STRIP | 12 refills | Status: DC

## 2019-03-26 MED ORDER — ASPIRIN EC 81 MG PO TBEC: 81.0000 mg | DELAYED_RELEASE_TABLET | Freq: Every day | ORAL | 2 refills | Status: DC

## 2019-03-26 NOTE — Progress Notes (Signed)
Prenatal Visit Note Date: 03/26/2019 Clinic: [facility]  Transfer of care from [facility] due to early GDM dx  Subjective:  Tiffany [name] is a 38 y.o. [phone] at [redacted]w[redacted]d being seen today for ongoing prenatal care.  She is currently monitored for the following issues for this high-risk pregnancy and has IDA (iron deficiency anemia); Cervical cancer screening; Uterine mass; COVID-19 virus infection; Supervision of high risk pregnancy, antepartum; AMA (advanced maternal age) multigravida 35+; Elevated AFP; Anemia in pregnancy; GDM (gestational diabetes mellitus); and Language barrier on their problem list.  Patient reports no complaints.   Contractions: Not present. Vag. Bleeding: None.  Movement: Present. Denies leaking of fluid.   The following portions of the patient's history were reviewed and updated as appropriate: allergies, current medications, past family history, past medical history, past social history, past surgical history and problem list. Problem list updated.  Objective:   Vitals:   03/26/19 0853  BP: 104/68  Pulse: 90  Temp: 98.2 F (36.8 C)  Weight: 102 lb (46.3 kg)    Fetal Status: Fetal Heart Rate (bpm): 160   Movement: Present     General:  Alert, oriented and cooperative. Patient is in no acute distress.  Skin: Skin is warm and dry. No rash noted.   Cardiovascular: Normal heart rate noted  Respiratory: Normal respiratory effort, no problems with respiration noted  Abdomen: Soft, gravid, appropriate for gestational age. Pain/Pressure: Present     Pelvic:  Cervical exam deferred        Extremities: Normal range of motion.  Edema: None  Mental Status: Normal mood and affect. Normal behavior. Normal judgment and thought content.   Urinalysis:      Assessment and Plan:  Pregnancy: [id number] at [redacted]w[redacted]d  1. Supervision of high risk pregnancy, antepartum Routine care. Will get baseline pre-eclampsia labs. Will set up for anatomy u/s. Pt has never had an u/s  yet this pregnancy, so will confirm dates after u/s; pt states her periods were qmonth and regular. Follow up NOB labs from [facility] - TSH - Protein / creatinine ratio, urine - CMP and Liver - Ferritin - Hemoglobin A1c - [place] MFM OB DETAIL +14 WK; Future  2. Gestational diabetes mellitus (GDM), antepartum, gestational diabetes method of control unspecified Never had it in pregnancy before. Largest prior was 2kg. Will need to set up to see dm education - Hemoglobin A1c  3. Elevated AFP Dates not confirmed. Follow up anatomy u/s and offer cffdna after dates confirmed  4. Antepartum anemia No s/s - Ferritin  5. COVID-19 virus infection No s/s. Dx may 2020  6. Antepartum multigravida of advanced maternal age Neg quad except for elevated afp but dates not confirmed. See above - [place] MFM OB DETAIL +14 WK; Future  7. Language barrier Interpreter used  Preterm labor symptoms and general obstetric precautions including but not limited to vaginal bleeding, contractions, leaking of fluid and fetal movement were reviewed in detail with the patient. Please refer to After Visit Summary for other counseling recommendations.   RTC:  Needs diabetes education visit Needs mfm anatomy u/s Hrob visit in person 1wk after seeing diabetes  Aletha Halim, MD

## 2019-03-27 LAB — CMP AND LIVER
ALT: 11 IU/L (ref 0–32)
AST: 21 IU/L (ref 0–40)
Albumin: 3.3 g/dL — ABNORMAL LOW (ref 3.8–4.8)
Alkaline Phosphatase: 49 IU/L (ref 39–117)
BUN: 10 mg/dL (ref 6–20)
Bilirubin Total: <0.2 mg/dL (ref 0.0–1.2)
Bilirubin, Direct: 0.08 mg/dL (ref 0.00–0.40)
CO2: 17 mmol/L — ABNORMAL LOW (ref 20–29)
Calcium: 8.9 mg/dL (ref 8.7–10.2)
Chloride: 105 mmol/L (ref 96–106)
Creatinine, Ser: 0.59 mg/dL (ref 0.57–1.00)
GFR calc Af Amer: 134 mL/min/{1.73_m2} (ref >59)
GFR calc non Af Amer: 116 mL/min/{1.73_m2} (ref >59)
Glucose: 95 mg/dL (ref 65–99)
Potassium: 4.2 mmol/L (ref 3.5–5.2)
Sodium: 138 mmol/L (ref 134–144)
Total Protein: 6.1 g/dL (ref 6.0–8.5)

## 2019-03-27 LAB — PROTEIN / CREATININE RATIO, URINE
Creatinine, Urine: 15.6 mg/dL
Protein, Ur: 6.8 mg/dL
Protein/Creat Ratio: 436 mg/g creat — ABNORMAL HIGH (ref 0–200)

## 2019-03-27 LAB — HEMOGLOBIN A1C
Est. average glucose Bld gHb Est-mCnc: 111 mg/dL
Hgb A1c MFr Bld: 5.5 % (ref 4.8–5.6)

## 2019-03-27 LAB — FERRITIN: Ferritin: 23 ng/mL (ref 15–150)

## 2019-03-27 LAB — TSH: TSH: 1.74 u[IU]/mL (ref 0.450–4.500)

## 2019-03-31 ENCOUNTER — Encounter: Payer: Self-pay | Admitting: Obstetrics and Gynecology

## 2019-03-31 DIAGNOSIS — O1210 Gestational proteinuria, unspecified trimester: Secondary | ICD-10-CM | POA: Insufficient documentation

## 2019-04-04 ENCOUNTER — Encounter: Payer: Self-pay | Admitting: *Deleted

## 2019-04-04 NOTE — Progress Notes (Signed)
Opened in error

## 2019-04-08 ENCOUNTER — Encounter (HOSPITAL_COMMUNITY): Payer: Self-pay | Admitting: *Deleted

## 2019-04-08 ENCOUNTER — Ambulatory Visit (HOSPITAL_COMMUNITY): Payer: Medicaid Other | Admitting: *Deleted

## 2019-04-08 ENCOUNTER — Other Ambulatory Visit: Payer: Self-pay

## 2019-04-08 ENCOUNTER — Ambulatory Visit (HOSPITAL_COMMUNITY)
Admission: RE | Admit: 2019-04-08 | Discharge: 2019-04-08 | Disposition: A | Discharge status: Home / Self Care | Payer: Medicaid Other | Source: Ambulatory Visit | Attending: Obstetrics and Gynecology | Admitting: Obstetrics and Gynecology

## 2019-04-08 ENCOUNTER — Other Ambulatory Visit (HOSPITAL_COMMUNITY): Payer: Self-pay | Admitting: *Deleted

## 2019-04-08 VITALS — BP 94/58 | HR 79 | Temp 97.8°F

## 2019-04-08 DIAGNOSIS — O99012 Anemia complicating pregnancy, second trimester: Secondary | ICD-10-CM | POA: Diagnosis not present

## 2019-04-08 DIAGNOSIS — O09529 Supervision of elderly multigravida, unspecified trimester: Secondary | ICD-10-CM | POA: Diagnosis not present

## 2019-04-08 DIAGNOSIS — O0990 Supervision of high risk pregnancy, unspecified, unspecified trimester: Secondary | ICD-10-CM | POA: Diagnosis not present

## 2019-04-08 DIAGNOSIS — O09522 Supervision of elderly multigravida, second trimester: Secondary | ICD-10-CM

## 2019-04-08 DIAGNOSIS — O24410 Gestational diabetes mellitus in pregnancy, diet controlled: Secondary | ICD-10-CM | POA: Diagnosis not present

## 2019-04-08 DIAGNOSIS — Z3A25 25 weeks gestation of pregnancy: Secondary | ICD-10-CM

## 2019-04-08 DIAGNOSIS — Z3687 Encounter for antenatal screening for uncertain dates: Secondary | ICD-10-CM | POA: Diagnosis not present

## 2019-04-08 DIAGNOSIS — Z361 Encounter for antenatal screening for raised alphafetoprotein level: Secondary | ICD-10-CM

## 2019-04-10 ENCOUNTER — Other Ambulatory Visit: Payer: Self-pay

## 2019-04-10 ENCOUNTER — Encounter: Payer: Medicaid Other | Attending: Obstetrics & Gynecology | Admitting: *Deleted

## 2019-04-10 ENCOUNTER — Ambulatory Visit: Payer: Medicaid Other | Admitting: *Deleted

## 2019-04-10 NOTE — Progress Notes (Signed)
  Patient was seen on 04/10/2019 for Gestational Diabetes self-management. EDD 07/23/2019. Patient speaks Tiffany [name], we used [employer] interpretor for this visit. Patient states no history of GDM. She works as a Regulatory affairs officer full time. Diet history obtained. Patient eats fair variety of all food groups with rice at every meal. Beverages include coconut juice & water.  The following learning objectives were met by the patient :   States the definition of Gestational Diabetes  States why dietary management is important in controlling blood glucose  Describes the effects of carbohydrates on blood glucose levels  Demonstrates ability to create a balanced meal plan  Demonstrates carbohydrate counting   States when to check blood glucose levels  Demonstrates proper blood glucose monitoring techniques  States the effect of stress and exercise on blood glucose levels  States the importance of limiting caffeine and abstaining from alcohol and smoking  Plan:  Aim for 3 Carb Choices per meal (45 grams) +/- 1 either way  Aim for 1-2 Carbs per snack Begin reading food labels for Total Carbohydrate of foods If OK with your MD, consider  increasing your activity level by walking, Arm Chair Exercises or other activity daily as tolerated Begin checking BG before breakfast and 2 hours after first bite of breakfast, lunch and dinner as directed by MD  Bring Log Book/Sheet and meter to every medical appointment OR use Baby Scripts (see below) Baby Scripts:  Patient not appropriate for [employer] due to language barrier  Take medication if directed by MD  Patient already has a meter: Accu Chek Guide Patient instructed to test pre breakfast and 2 hours each meal as directed by MD  Patient instructed to monitor glucose levels: FBS: 60 - 95 mg/dl 2 hour: <120 mg/dl  Patient received the following handouts:  Nutrition Diabetes and Pregnancy  Carbohydrate Counting List  BG Log Sheet  Patient will be  seen for follow-up as needed.

## 2019-04-17 ENCOUNTER — Ambulatory Visit (INDEPENDENT_AMBULATORY_CARE_PROVIDER_SITE_OTHER): Payer: Medicaid Other | Admitting: Family Medicine

## 2019-04-17 ENCOUNTER — Other Ambulatory Visit: Payer: Self-pay

## 2019-04-17 VITALS — BP 99/66 | HR 93 | Temp 98.1°F | Wt 102.2 lb

## 2019-04-17 DIAGNOSIS — O24410 Gestational diabetes mellitus in pregnancy, diet controlled: Secondary | ICD-10-CM

## 2019-04-17 DIAGNOSIS — Z3A26 26 weeks gestation of pregnancy: Secondary | ICD-10-CM

## 2019-04-17 DIAGNOSIS — O0990 Supervision of high risk pregnancy, unspecified, unspecified trimester: Secondary | ICD-10-CM

## 2019-04-17 DIAGNOSIS — O0992 Supervision of high risk pregnancy, unspecified, second trimester: Secondary | ICD-10-CM | POA: Diagnosis not present

## 2019-04-17 DIAGNOSIS — Z23 Encounter for immunization: Secondary | ICD-10-CM

## 2019-04-17 DIAGNOSIS — O99013 Anemia complicating pregnancy, third trimester: Secondary | ICD-10-CM

## 2019-04-17 DIAGNOSIS — O99012 Anemia complicating pregnancy, second trimester: Secondary | ICD-10-CM

## 2019-04-17 NOTE — Patient Instructions (Signed)

## 2019-04-17 NOTE — Progress Notes (Signed)
   PRENATAL VISIT NOTE  Subjective:  Tiffany [name] is a 38 y.o. G4P3003 at [redacted]w[redacted]d being seen today for ongoing prenatal care.  She is currently monitored for the following issues for this high-risk pregnancy and has Uterine mass; COVID-19 virus infection; Supervision of high risk pregnancy, antepartum; AMA (advanced maternal age) multigravida 35+; Elevated AFP; Anemia in pregnancy; GDM (gestational diabetes mellitus); Language barrier; and Proteinuria in pregnancy, antepartum on their problem list.  Patient reports no complaints.  Contractions: Not present. Vag. Bleeding: None.  Movement: Present. Denies leaking of fluid.   The following portions of the patient's history were reviewed and updated as appropriate: allergies, current medications, past family history, past medical history, past social history, past surgical history and problem list.   Objective:   Vitals:   04/17/19 1616  BP: 99/66  Pulse: 93  Temp: 98.1 F (36.7 C)  Weight: 102 lb 3.2 oz (46.4 kg)    Fetal Status: Fetal Heart Rate (bpm): 150   Movement: Present     General:  Alert, oriented and cooperative. Patient is in no acute distress.  Skin: Skin is warm and dry. No rash noted.   Cardiovascular: Normal heart rate noted  Respiratory: Normal respiratory effort, no problems with respiration noted  Abdomen: Soft, gravid, appropriate for gestational age.  Pain/Pressure: Present     Pelvic: Cervical exam deferred        Extremities: Normal range of motion.  Edema: None  Mental Status: Normal mood and affect. Normal behavior. Normal judgment and thought content.   Assessment and Plan:  Pregnancy: G4P3003 at [redacted]w[redacted]d 1. Diet controlled gestational diabetes mellitus (GDM) in third trimester FBS 89-102 2 hour pp 78-166, mostly around dietary indiscretions with rice-switch to brown rice, smaller portion sizes, add 10 minutes of exercise.  2. Supervision of high risk pregnancy, antepartum 28 wk labs and TDaP today - CBC - RPR -  HIV Antibody (routine testing w rflx)  Preterm labor symptoms and general obstetric precautions including but not limited to vaginal bleeding, contractions, leaking of fluid and fetal movement were reviewed in detail with the patient. Please refer to After Visit Summary for other counseling recommendations.   Return in 2 weeks (on 05/01/2019).  Future Appointments  Date Time Provider Department Center  05/01/2019  3:15 PM Woodroe Mode, MD [place]  05/09/2019 10:15 AM WH-MFC NURSE WH-MFC MFC-US  05/09/2019 10:15 AM WH-MFC [place] 4 WH-MFCUS MFC-US    Donnamae Jude, MD

## 2019-04-18 LAB — CBC
Hematocrit: 29.5 % — ABNORMAL LOW (ref 34.0–46.6)
Hemoglobin: 9.8 g/dL — ABNORMAL LOW (ref 11.1–15.9)
MCH: 28.8 pg (ref 26.6–33.0)
MCHC: 33.2 g/dL (ref 31.5–35.7)
MCV: 87 fL (ref 79–97)
Platelets: 268 10*3/uL (ref 150–450)
RBC: 3.4 x10E6/uL — ABNORMAL LOW (ref 3.77–5.28)
RDW: 18.3 % — ABNORMAL HIGH (ref 11.7–15.4)
WBC: 8.5 10*3/uL (ref 3.4–10.8)

## 2019-04-18 LAB — RPR: RPR Ser Ql: NONREACTIVE

## 2019-04-18 LAB — HIV ANTIBODY (ROUTINE TESTING W REFLEX): HIV Screen 4th Generation wRfx: NONREACTIVE

## 2019-04-21 MED ORDER — FERROUS SULFATE 325 (65 FE) MG PO TABS: 325.0000 mg | ORAL_TABLET | Freq: Three times a day (TID) | ORAL | 3 refills | Status: DC

## 2019-04-21 NOTE — Addendum Note (Signed)
Addended by: Donnamae Jude on: 04/21/2019 10:57 AM   Modules accepted: Orders

## 2019-04-21 NOTE — Addendum Note (Signed)
Addended by: Donnamae Jude on: 04/21/2019 08:46 AM   Modules accepted: Orders

## 2019-04-22 ENCOUNTER — Telehealth: Payer: Self-pay | Admitting: Lactation Services

## 2019-04-22 NOTE — Telephone Encounter (Signed)
CAlled pt with assistance of [place] # [phone]. Pt did not answer. LM for pt to call the office in regards to needing to start Iron Supplements.

## 2019-04-22 NOTE — Telephone Encounter (Signed)
-----   Message from Tiffany Jude, MD sent at 04/21/2019  8:46 AM EDT ----- Has anemia--rx sent in--please inform pat/ensure she is taking her iron.

## 2019-04-23 ENCOUNTER — Encounter: Payer: Self-pay | Admitting: *Deleted

## 2019-04-23 NOTE — Telephone Encounter (Addendum)
Called Ishikawa with [place] [phone] and left message we are calling with some important information from your provider; please call our office.  Will send letter. Per [employer] translates Santiago Glad not [employer] in [place]. Have placed note on her next appointment 05/01/19 to review with her. Linda,RN

## 2019-05-01 ENCOUNTER — Ambulatory Visit (INDEPENDENT_AMBULATORY_CARE_PROVIDER_SITE_OTHER): Payer: Medicaid Other | Admitting: Obstetrics & Gynecology

## 2019-05-01 ENCOUNTER — Other Ambulatory Visit: Payer: Self-pay

## 2019-05-01 VITALS — BP 100/63 | HR 94 | Temp 97.9°F | Wt 104.0 lb

## 2019-05-01 DIAGNOSIS — Z3A28 28 weeks gestation of pregnancy: Secondary | ICD-10-CM

## 2019-05-01 DIAGNOSIS — O0990 Supervision of high risk pregnancy, unspecified, unspecified trimester: Secondary | ICD-10-CM

## 2019-05-01 DIAGNOSIS — O24410 Gestational diabetes mellitus in pregnancy, diet controlled: Secondary | ICD-10-CM

## 2019-05-01 DIAGNOSIS — O0993 Supervision of high risk pregnancy, unspecified, third trimester: Secondary | ICD-10-CM

## 2019-05-01 MED ORDER — METFORMIN HCL 500 MG PO TABS: 500.0000 mg | ORAL_TABLET | Freq: Every day | ORAL | 3 refills | Status: DC

## 2019-05-01 NOTE — Progress Notes (Addendum)
[place] interpreter [phone] used for encounter.

## 2019-05-01 NOTE — Progress Notes (Signed)
   PRENATAL VISIT NOTE  Subjective:  Tiffany [name] is a 38 y.o. G4P3003 at [redacted]w[redacted]d being seen today for ongoing prenatal care.  She is currently monitored for the following issues for this high-risk pregnancy and has Uterine mass; COVID-19 virus infection; Supervision of high risk pregnancy, antepartum; AMA (advanced maternal age) multigravida 35+; Elevated AFP; Anemia in pregnancy; GDM (gestational diabetes mellitus); Language barrier; and Proteinuria in pregnancy, antepartum on their problem list.  Patient reports no complaints.  Contractions: Not present. Vag. Bleeding: None.  Movement: Present. Denies leaking of fluid.   The following portions of the patient's history were reviewed and updated as appropriate: allergies, current medications, past family history, past medical history, past social history, past surgical history and problem list.   Objective:   Vitals:   05/01/19 1633  BP: 100/63  Pulse: 94  Temp: 97.9 F (36.6 C)  Weight: 104 lb (47.2 kg)    Fetal Status: Fetal Heart Rate (bpm): 154   Movement: Present     General:  Alert, oriented and cooperative. Patient is in no acute distress.  Skin: Skin is warm and dry. No rash noted.   Cardiovascular: Normal heart rate noted  Respiratory: Normal respiratory effort, no problems with respiration noted  Abdomen: Soft, gravid, appropriate for gestational age.  Pain/Pressure: Present     Pelvic: Cervical exam deferred        Extremities: Normal range of motion.  Edema: None  Mental Status: Normal mood and affect. Normal behavior. Normal judgment and thought content.   Assessment and Plan:  Pregnancy: G4P3003 at [redacted]w[redacted]d 1. Diet controlled gestational diabetes mellitus (GDM) in third trimester FBS above 100 and PP up to 188, add medication and she was counseled re. IOL 39 weeks and fetal surveillance - metFORMIN (GLUCOPHAGE) 500 MG tablet; Take 1 tablet (500 mg total) by mouth at bedtime.  Dispense: 30 tablet; Refill: 3  2. Supervision of  high risk pregnancy, antepartum   Preterm labor symptoms and general obstetric precautions including but not limited to vaginal bleeding, contractions, leaking of fluid and fetal movement were reviewed in detail with the patient. Please refer to After Visit Summary for other counseling recommendations.   Return in about 2 weeks (around 05/15/2019) for virtual.  Future Appointments  Date Time Provider [place]  05/09/2019 10:15 AM [place] MFC-US  05/09/2019 10:15 AM WH-MFC [place] 4 WH-MFCUS MFC-US    Emeterio Reeve, MD

## 2019-05-01 NOTE — Patient Instructions (Signed)

## 2019-05-09 ENCOUNTER — Encounter (HOSPITAL_COMMUNITY): Payer: Self-pay

## 2019-05-09 ENCOUNTER — Ambulatory Visit (HOSPITAL_COMMUNITY): Payer: Medicaid Other

## 2019-05-16 ENCOUNTER — Encounter (HOSPITAL_COMMUNITY): Payer: Self-pay

## 2019-05-16 ENCOUNTER — Ambulatory Visit (HOSPITAL_COMMUNITY): Payer: Medicaid Other | Admitting: *Deleted

## 2019-05-16 ENCOUNTER — Ambulatory Visit (HOSPITAL_COMMUNITY)
Admission: RE | Admit: 2019-05-16 | Discharge: 2019-05-16 | Disposition: A | Discharge status: Home / Self Care | Payer: Medicaid Other | Source: Ambulatory Visit | Attending: Obstetrics and Gynecology | Admitting: Obstetrics and Gynecology

## 2019-05-16 ENCOUNTER — Other Ambulatory Visit: Payer: Self-pay

## 2019-05-16 VITALS — BP 98/66 | Temp 98.0°F

## 2019-05-16 DIAGNOSIS — Z3A30 30 weeks gestation of pregnancy: Secondary | ICD-10-CM

## 2019-05-16 DIAGNOSIS — O09523 Supervision of elderly multigravida, third trimester: Secondary | ICD-10-CM | POA: Diagnosis not present

## 2019-05-16 DIAGNOSIS — Z362 Encounter for other antenatal screening follow-up: Secondary | ICD-10-CM

## 2019-05-16 DIAGNOSIS — O24415 Gestational diabetes mellitus in pregnancy, controlled by oral hypoglycemic drugs: Secondary | ICD-10-CM | POA: Diagnosis not present

## 2019-05-16 DIAGNOSIS — O09529 Supervision of elderly multigravida, unspecified trimester: Secondary | ICD-10-CM | POA: Insufficient documentation

## 2019-05-19 ENCOUNTER — Other Ambulatory Visit: Payer: Self-pay

## 2019-05-19 ENCOUNTER — Ambulatory Visit (INDEPENDENT_AMBULATORY_CARE_PROVIDER_SITE_OTHER): Payer: Medicaid Other | Admitting: Obstetrics & Gynecology

## 2019-05-19 ENCOUNTER — Other Ambulatory Visit (HOSPITAL_COMMUNITY): Payer: Self-pay | Admitting: *Deleted

## 2019-05-19 VITALS — BP 94/61 | HR 90 | Wt 103.1 lb

## 2019-05-19 DIAGNOSIS — O24415 Gestational diabetes mellitus in pregnancy, controlled by oral hypoglycemic drugs: Secondary | ICD-10-CM | POA: Diagnosis not present

## 2019-05-19 DIAGNOSIS — Z23 Encounter for immunization: Secondary | ICD-10-CM | POA: Diagnosis not present

## 2019-05-19 DIAGNOSIS — Z3A31 31 weeks gestation of pregnancy: Secondary | ICD-10-CM

## 2019-05-19 DIAGNOSIS — O24410 Gestational diabetes mellitus in pregnancy, diet controlled: Secondary | ICD-10-CM

## 2019-05-19 NOTE — Progress Notes (Signed)
Video Interpreter # [phone]

## 2019-05-19 NOTE — Patient Instructions (Signed)

## 2019-05-19 NOTE — Progress Notes (Signed)
   PRENATAL VISIT NOTE  Subjective:  Tiffany [name] is a 38 y.o. G4P3003 at [redacted]w[redacted]d being seen today for ongoing prenatal care.  She is currently monitored for the following issues for this high-risk pregnancy and has Uterine mass; COVID-19 virus infection; Supervision of high risk pregnancy, antepartum; AMA (advanced maternal age) multigravida 35+; Elevated AFP; Anemia in pregnancy; GDM (gestational diabetes mellitus); Language barrier; and Proteinuria in pregnancy, antepartum on their problem list.  Patient reports no complaints.  Contractions: Not present. Vag. Bleeding: None.  Movement: Present. Denies leaking of fluid.   The following portions of the patient's history were reviewed and updated as appropriate: allergies, current medications, past family history, past medical history, past social history, past surgical history and problem list.   Objective:   Vitals:   05/19/19 0958  BP: 94/61  Pulse: 90  Weight: 103 lb 1.6 oz (46.8 kg)    Fetal Status: Fetal Heart Rate (bpm): 138   Movement: Present     General:  Alert, oriented and cooperative. Patient is in no acute distress.  Skin: Skin is warm and dry. No rash noted.   Cardiovascular: Normal heart rate noted  Respiratory: Normal respiratory effort, no problems with respiration noted  Abdomen: Soft, gravid, appropriate for gestational age.  Pain/Pressure: Present     Pelvic: Cervical exam deferred        Extremities: Normal range of motion.  Edema: None  Mental Status: Normal mood and affect. Normal behavior. Normal judgment and thought content.   Assessment and Plan:  Pregnancy: G4P3003 at [redacted]w[redacted]d There are no diagnoses linked to this encounter. Preterm labor symptoms and general obstetric precautions including but not limited to vaginal bleeding, contractions, leaking of fluid and fetal movement were reviewed in detail with the patient. Please refer to After Visit Summary for other counseling recommendations.  FBS all nl and 80+% PP  wnl on metformin at bedtime Return in about 1 week (around 05/26/2019) for start BPP and NST 32 weeks.  Future Appointments  Date Time Provider [place]  05/29/2019  8:15 AM WOC-WOCA NST WOC-WOCA WOC  05/29/2019  9:15 AM Emily Filbert, MD WOC-WOCA WOC  05/30/2019  3:45 PM [place] NURSE [place] MFC-US  05/30/2019  3:45 PM [place] 2 WH-MFCUS MFC-US  06/05/2019  2:40 PM [place] NURSE [place] MFC-US  06/05/2019  2:45 PM [place] 5 WH-MFCUS MFC-US  06/12/2019  3:10 PM  NURSE [place] MFC-US  06/12/2019  3:15 PM [place] 4 WH-MFCUS MFC-US    Emeterio Reeve, MD

## 2019-05-28 ENCOUNTER — Telehealth: Payer: Self-pay | Admitting: Obstetrics & Gynecology

## 2019-05-28 NOTE — Telephone Encounter (Signed)
Spoke with patient w/ Tiffany [name] interpreter ID# [phone] about her appointment on 9/24 @ 8:15. Patient instructed to wear a face mask for the entire appointment and no visitors are allowed. Patient screened for covid symptoms and denied having any.

## 2019-05-29 ENCOUNTER — Ambulatory Visit (INDEPENDENT_AMBULATORY_CARE_PROVIDER_SITE_OTHER): Payer: Medicaid Other | Admitting: *Deleted

## 2019-05-29 ENCOUNTER — Ambulatory Visit: Payer: Self-pay

## 2019-05-29 ENCOUNTER — Ambulatory Visit (INDEPENDENT_AMBULATORY_CARE_PROVIDER_SITE_OTHER): Payer: Medicaid Other | Admitting: Obstetrics & Gynecology

## 2019-05-29 ENCOUNTER — Other Ambulatory Visit: Payer: Self-pay

## 2019-05-29 VITALS — BP 90/58 | HR 96 | Wt 102.1 lb

## 2019-05-29 DIAGNOSIS — O09523 Supervision of elderly multigravida, third trimester: Secondary | ICD-10-CM

## 2019-05-29 DIAGNOSIS — Z3A32 32 weeks gestation of pregnancy: Secondary | ICD-10-CM

## 2019-05-29 DIAGNOSIS — O24415 Gestational diabetes mellitus in pregnancy, controlled by oral hypoglycemic drugs: Secondary | ICD-10-CM

## 2019-05-29 DIAGNOSIS — O0993 Supervision of high risk pregnancy, unspecified, third trimester: Secondary | ICD-10-CM

## 2019-05-29 DIAGNOSIS — O0990 Supervision of high risk pregnancy, unspecified, unspecified trimester: Secondary | ICD-10-CM

## 2019-05-29 DIAGNOSIS — Z789 Other specified health status: Secondary | ICD-10-CM

## 2019-05-29 DIAGNOSIS — R772 Abnormality of alphafetoprotein: Secondary | ICD-10-CM

## 2019-05-29 DIAGNOSIS — O99013 Anemia complicating pregnancy, third trimester: Secondary | ICD-10-CM

## 2019-05-29 MED ORDER — DOCUSATE SODIUM 100 MG PO CAPS: 100.0000 mg | ORAL_CAPSULE | Freq: Two times a day (BID) | ORAL | 1 refills | Status: DC

## 2019-05-29 NOTE — Progress Notes (Signed)
Post interpreter [place], #[phone] used for encounter. Pt reports constipation. Rx for Colace e-prescribed and pt advised to increase po fluids as well as increase vegetables.  Pt informed that the ultrasound is considered a limited OB ultrasound and is not intended to be a complete ultrasound exam.  Patient also informed that the ultrasound is not being completed with the intent of assessing for fetal or placental anomalies or any pelvic abnormalities.  Explained that the purpose of today's ultrasound is to assess for presentation, BPP and amniotic fluid volume.  Patient acknowledges the purpose of the exam and the limitations of the study.

## 2019-05-29 NOTE — Progress Notes (Signed)
   PRENATAL VISIT NOTE  Subjective:  Tiffany [name] is a 38 y.o. G4P3003 at [redacted]w[redacted]d being seen today for ongoing prenatal care.  She is currently monitored for the following issues for this high-risk pregnancy and has Uterine mass; COVID-19 virus infection; Supervision of high risk pregnancy, antepartum; AMA (advanced maternal age) multigravida 35+; Elevated AFP; Anemia in pregnancy; GDM (gestational diabetes mellitus); Language barrier; and Proteinuria in pregnancy, antepartum on their problem list.  Patient reports no complaints.  Contractions: Not present. Vag. Bleeding: None.  Movement: Present. Denies leaking of fluid.   The following portions of the patient's history were reviewed and updated as appropriate: allergies, current medications, past family history, past medical history, past social history, past surgical history and problem list.   Objective:   Vitals:   05/29/19 0854  BP: (!) 90/58  Pulse: 96  Weight: 102 lb 1.6 oz (46.3 kg)    Fetal Status: Fetal Heart Rate (bpm): NST   Movement: Present     General:  Alert, oriented and cooperative. Patient is in no acute distress.  Skin: Skin is warm and dry. No rash noted.   Cardiovascular: Normal heart rate noted  Respiratory: Normal respiratory effort, no problems with respiration noted  Abdomen: Soft, gravid, appropriate for gestational age.  Pain/Pressure: Present     Pelvic: Cervical exam deferred        Extremities: Normal range of motion.  Edema: None  Mental Status: Normal mood and affect. Normal behavior. Normal judgment and thought content.   Assessment and Plan:  Pregnancy: G4P3003 at [redacted]w[redacted]d 1. Supervision of high risk pregnancy, antepartum   2. Anemia during pregnancy in third trimester - on iron pill daily  3. Gestational diabetes mellitus (GDM) in third trimester controlled on oral hypoglycemic drug - excellent sugars - MFM follow up u/s scheduled - weekly BPP  4. Elevated AFP   5. Language barrier  6.  Multigravida of advanced maternal age in third trimester - she declines BTL  Preterm labor symptoms and general obstetric precautions including but not limited to vaginal bleeding, contractions, leaking of fluid and fetal movement were reviewed in detail with the patient. Please refer to After Visit Summary for other counseling recommendations.   Return in about 1 week (around 06/05/2019) for as scheduled.  Future Appointments  Date Time Provider [place]  06/05/2019  9:15 AM WOC-WOCA NST WOC-WOCA WOC  06/05/2019 10:15 AM Sloan Leiter, MD [place]  06/05/2019  2:40 PM [place] NURSE [place] MFC-US  06/05/2019  2:45 PM [place] 5 WH-MFCUS MFC-US  06/12/2019 10:15 AM Aletha Halim, MD WOC-WOCA WOC  06/12/2019 11:15 AM WOC-WOCA NST WOC-WOCA WOC  06/12/2019  3:10 PM [place] NURSE [place] MFC-US  06/12/2019  3:15 PM [place] 4 WH-MFCUS MFC-US  06/19/2019 10:15 AM WOC-WOCA NST WOC-WOCA WOC  06/19/2019 11:15 AM Sloan Leiter, MD [place]  06/26/2019  2:15 PM WOC-WOCA NST WOC-WOCA WOC  06/26/2019  3:15 PM Woodroe Mode, MD WOC-WOCA [place]  07/03/2019  9:15 AM WOC-WOCA NST WOC-WOCA WOC  07/03/2019 10:15 AM Woodroe Mode, MD WOC-WOCA WOC  07/10/2019  9:15 AM WOC-WOCA NST WOC-WOCA WOC  07/10/2019 10:15 AM Sloan Leiter, MD WOC-WOCA [place]  07/17/2019  9:15 AM WOC-WOCA NST WOC-WOCA WOC  07/17/2019 10:15 AM Woodroe Mode, MD WOC-WOCA WOC    Emily Filbert, MD

## 2019-05-30 ENCOUNTER — Encounter (HOSPITAL_COMMUNITY): Payer: Self-pay

## 2019-05-30 ENCOUNTER — Ambulatory Visit (HOSPITAL_COMMUNITY): Payer: Medicaid Other

## 2019-06-04 ENCOUNTER — Telehealth: Payer: Self-pay | Admitting: Obstetrics and Gynecology

## 2019-06-04 NOTE — Telephone Encounter (Signed)
Attempted to call patient w/ Tiffany [name] interpreter ID# [phone] about her appointment on 10/1 @ 9:15. No answer, interpreter left voicemail instructing patient to wear a face mask for the entire appointment and no visitors are allowed during the visit. Patient instructed not to attend the appointment if she was any symptoms. Symptom list and office number left.

## 2019-06-05 ENCOUNTER — Ambulatory Visit (INDEPENDENT_AMBULATORY_CARE_PROVIDER_SITE_OTHER): Payer: Medicaid Other | Admitting: Obstetrics and Gynecology

## 2019-06-05 ENCOUNTER — Other Ambulatory Visit: Payer: Self-pay

## 2019-06-05 ENCOUNTER — Encounter (HOSPITAL_COMMUNITY): Payer: Self-pay

## 2019-06-05 ENCOUNTER — Ambulatory Visit (HOSPITAL_COMMUNITY): Payer: Medicaid Other | Admitting: *Deleted

## 2019-06-05 ENCOUNTER — Ambulatory Visit (HOSPITAL_COMMUNITY)
Admission: RE | Admit: 2019-06-05 | Discharge: 2019-06-05 | Disposition: A | Discharge status: Home / Self Care | Payer: Medicaid Other | Source: Ambulatory Visit | Attending: Obstetrics | Admitting: Obstetrics

## 2019-06-05 ENCOUNTER — Ambulatory Visit (INDEPENDENT_AMBULATORY_CARE_PROVIDER_SITE_OTHER): Payer: Medicaid Other | Admitting: *Deleted

## 2019-06-05 ENCOUNTER — Encounter: Payer: Self-pay | Admitting: Obstetrics and Gynecology

## 2019-06-05 VITALS — BP 98/71 | HR 102 | Wt 102.1 lb

## 2019-06-05 VITALS — BP 90/60 | HR 92 | Temp 97.9°F

## 2019-06-05 DIAGNOSIS — O24415 Gestational diabetes mellitus in pregnancy, controlled by oral hypoglycemic drugs: Secondary | ICD-10-CM

## 2019-06-05 DIAGNOSIS — R772 Abnormality of alphafetoprotein: Secondary | ICD-10-CM

## 2019-06-05 DIAGNOSIS — O99013 Anemia complicating pregnancy, third trimester: Secondary | ICD-10-CM | POA: Diagnosis not present

## 2019-06-05 DIAGNOSIS — O09523 Supervision of elderly multigravida, third trimester: Secondary | ICD-10-CM

## 2019-06-05 DIAGNOSIS — O0993 Supervision of high risk pregnancy, unspecified, third trimester: Secondary | ICD-10-CM

## 2019-06-05 DIAGNOSIS — Z3A33 33 weeks gestation of pregnancy: Secondary | ICD-10-CM

## 2019-06-05 DIAGNOSIS — Z789 Other specified health status: Secondary | ICD-10-CM

## 2019-06-05 DIAGNOSIS — O1210 Gestational proteinuria, unspecified trimester: Secondary | ICD-10-CM | POA: Diagnosis not present

## 2019-06-05 DIAGNOSIS — O24410 Gestational diabetes mellitus in pregnancy, diet controlled: Secondary | ICD-10-CM | POA: Insufficient documentation

## 2019-06-05 DIAGNOSIS — U071 COVID-19: Secondary | ICD-10-CM

## 2019-06-05 DIAGNOSIS — O0990 Supervision of high risk pregnancy, unspecified, unspecified trimester: Secondary | ICD-10-CM

## 2019-06-05 DIAGNOSIS — O09529 Supervision of elderly multigravida, unspecified trimester: Secondary | ICD-10-CM | POA: Insufficient documentation

## 2019-06-05 NOTE — Progress Notes (Signed)
   PRENATAL VISIT NOTE  Subjective:  Tiffany [name] is a 38 y.o. G4P3003 at [redacted]w[redacted]d being seen today for ongoing prenatal care.  She is currently monitored for the following issues for this high-risk pregnancy and has Uterine mass; COVID-19 virus infection; Supervision of high risk pregnancy, antepartum; AMA (advanced maternal age) multigravida 35+; Elevated AFP; Anemia in pregnancy; GDM (gestational diabetes mellitus); Language barrier; and Proteinuria in pregnancy, antepartum on their problem list.  Patient reports no complaints.  Contractions: Not present. Vag. Bleeding: None.  Movement: Present. Denies leaking of fluid.   The following portions of the patient's history were reviewed and updated as appropriate: allergies, current medications, past family history, past medical history, past social history, past surgical history and problem list.   Objective:   Vitals:   06/05/19 1010  BP: 98/71  Pulse: (!) 102  Weight: 102 lb 1.6 oz (46.3 kg)    Fetal Status: Fetal Heart Rate (bpm): NST   Movement: Present     General:  Alert, oriented and cooperative. Patient is in no acute distress.  Skin: Skin is warm and dry. No rash noted.   Cardiovascular: Normal heart rate noted  Respiratory: Normal respiratory effort, no problems with respiration noted  Abdomen: Soft, gravid, appropriate for gestational age.  Pain/Pressure: Present     Pelvic: Cervical exam deferred        Extremities: Normal range of motion.  Edema: None  Mental Status: Normal mood and affect. Normal behavior. Normal judgment and thought content.   Assessment and Plan:  Pregnancy: G4P3003 at [redacted]w[redacted]d  1. Supervision of high risk pregnancy, antepartum  2. Multigravida of advanced maternal age in third trimester  3. Elevated AFP  4. Anemia during pregnancy in third trimester Cont iron  5. Language barrier Santiago Glad interpretor used  6. COVID-19 virus infection 01/2019  7. Gestational diabetes mellitus (GDM) in third trimester  controlled on oral hypoglycemic drug On metformin 500 mg QHS All FG < 90 PP mostly under 130, occasional higher Cont current regimen NST reactive BPP and growth this afternoon   Preterm labor symptoms and general obstetric precautions including but not limited to vaginal bleeding, contractions, leaking of fluid and fetal movement were reviewed in detail with the patient. Please refer to After Visit Summary for other counseling recommendations.   Return in about 2 weeks (around 06/19/2019) for high OB, in person.  Future Appointments  Date Time Provider [place]  06/05/2019  2:40 PM [place] NURSE [place] MFC-US  06/05/2019  2:45 PM [place] 5 WH-MFCUS MFC-US  06/12/2019 10:15 AM Aletha Halim, MD WOC-WOCA WOC  06/12/2019 11:15 AM WOC-WOCA NST WOC-WOCA WOC  06/12/2019  3:10 PM [place] NURSE [place] MFC-US  06/12/2019  3:15 PM [place] 4 WH-MFCUS MFC-US  06/19/2019 10:15 AM WOC-WOCA NST WOC-WOCA WOC  06/19/2019 11:15 AM Sloan Leiter, MD [place]  06/26/2019  2:15 PM WOC-WOCA NST WOC-WOCA WOC  06/26/2019  3:15 PM Woodroe Mode, MD WOC-WOCA [place]  07/03/2019  9:15 AM WOC-WOCA NST WOC-WOCA WOC  07/03/2019 10:15 AM Woodroe Mode, MD WOC-WOCA WOC  07/10/2019  9:15 AM WOC-WOCA NST WOC-WOCA WOC  07/10/2019 10:15 AM Sloan Leiter, MD WOC-WOCA [place]  07/17/2019  9:15 AM WOC-WOCA NST WOC-WOCA WOC  07/17/2019 10:15 AM Woodroe Mode, MD WOC-WOCA WOC    Sloan Leiter, MD

## 2019-06-05 NOTE — Progress Notes (Signed)
[place] interpreter # [phone] used for encounter.  Pt has [place] growth/BPP @ MFM today 1445.

## 2019-06-09 ENCOUNTER — Other Ambulatory Visit (HOSPITAL_COMMUNITY): Payer: Medicaid Other

## 2019-06-12 ENCOUNTER — Ambulatory Visit (HOSPITAL_COMMUNITY)
Admission: RE | Admit: 2019-06-12 | Discharge: 2019-06-12 | Disposition: A | Discharge status: Home / Self Care | Payer: Medicaid Other | Source: Ambulatory Visit | Attending: Obstetrics | Admitting: Obstetrics

## 2019-06-12 ENCOUNTER — Other Ambulatory Visit: Payer: Self-pay

## 2019-06-12 ENCOUNTER — Encounter (HOSPITAL_COMMUNITY): Payer: Self-pay

## 2019-06-12 ENCOUNTER — Other Ambulatory Visit: Payer: Medicaid Other

## 2019-06-12 ENCOUNTER — Ambulatory Visit (HOSPITAL_COMMUNITY): Payer: Medicaid Other | Admitting: *Deleted

## 2019-06-12 ENCOUNTER — Ambulatory Visit (INDEPENDENT_AMBULATORY_CARE_PROVIDER_SITE_OTHER): Payer: Medicaid Other | Admitting: Obstetrics and Gynecology

## 2019-06-12 VITALS — BP 99/65 | HR 96 | Temp 97.7°F | Wt 103.5 lb

## 2019-06-12 VITALS — BP 81/51 | HR 103 | Temp 98.1°F

## 2019-06-12 DIAGNOSIS — R772 Abnormality of alphafetoprotein: Secondary | ICD-10-CM

## 2019-06-12 DIAGNOSIS — Z603 Acculturation difficulty: Secondary | ICD-10-CM

## 2019-06-12 DIAGNOSIS — O09529 Supervision of elderly multigravida, unspecified trimester: Secondary | ICD-10-CM | POA: Diagnosis not present

## 2019-06-12 DIAGNOSIS — O24410 Gestational diabetes mellitus in pregnancy, diet controlled: Secondary | ICD-10-CM | POA: Diagnosis not present

## 2019-06-12 DIAGNOSIS — Z362 Encounter for other antenatal screening follow-up: Secondary | ICD-10-CM | POA: Diagnosis not present

## 2019-06-12 DIAGNOSIS — O24415 Gestational diabetes mellitus in pregnancy, controlled by oral hypoglycemic drugs: Secondary | ICD-10-CM | POA: Diagnosis not present

## 2019-06-12 DIAGNOSIS — O0990 Supervision of high risk pregnancy, unspecified, unspecified trimester: Secondary | ICD-10-CM

## 2019-06-12 DIAGNOSIS — O99013 Anemia complicating pregnancy, third trimester: Secondary | ICD-10-CM | POA: Diagnosis not present

## 2019-06-12 DIAGNOSIS — O1213 Gestational proteinuria, third trimester: Secondary | ICD-10-CM | POA: Diagnosis not present

## 2019-06-12 DIAGNOSIS — Z3A34 34 weeks gestation of pregnancy: Secondary | ICD-10-CM

## 2019-06-12 DIAGNOSIS — O09523 Supervision of elderly multigravida, third trimester: Secondary | ICD-10-CM

## 2019-06-12 DIAGNOSIS — O0993 Supervision of high risk pregnancy, unspecified, third trimester: Secondary | ICD-10-CM

## 2019-06-12 DIAGNOSIS — O1210 Gestational proteinuria, unspecified trimester: Secondary | ICD-10-CM

## 2019-06-12 DIAGNOSIS — Z789 Other specified health status: Secondary | ICD-10-CM

## 2019-06-12 MED ORDER — METFORMIN HCL 500 MG PO TABS: ORAL_TABLET | ORAL | 0 refills | Status: DC

## 2019-06-12 MED FILL — metFORMIN HCL 500 MG TABS: 500 | 30 days supply | Qty: 60 | Fill #0

## 2019-06-12 NOTE — Progress Notes (Signed)
Pt has paper logs for Glucose readings. 

## 2019-06-12 NOTE — Progress Notes (Signed)
Prenatal Visit Note Date: 06/12/2019 Clinic: [facility]  Subjective:  Tiffany [name] is a 38 y.o. [phone] at [redacted]w[redacted]d being seen today for ongoing prenatal care.  She is currently monitored for the following issues for this high-risk pregnancy and has Uterine mass; COVID-19 virus infection; Supervision of high risk pregnancy, antepartum; AMA (advanced maternal age) multigravida 35+; Elevated AFP; Anemia in pregnancy; GDM (gestational diabetes mellitus); Language barrier; and Proteinuria in pregnancy, antepartum on their problem list.  Patient reports no complaints.   Contractions: Irritability. Vag. Bleeding: None.  Movement: Present. Denies leaking of fluid.   The following portions of the patient's history were reviewed and updated as appropriate: allergies, current medications, past family history, past medical history, past social history, past surgical history and problem list. Problem list updated.  Objective:   Vitals:   06/12/19 1036  BP: 99/65  Pulse: 96  Temp: 97.7 F (36.5 C)  Weight: 103 lb 8 oz (46.9 kg)    Fetal Status: Fetal Heart Rate (bpm): 145   Movement: Present     General:  Alert, oriented and cooperative. Patient is in no acute distress.  Skin: Skin is warm and dry. No rash noted.   Cardiovascular: Normal heart rate noted  Respiratory: Normal respiratory effort, no problems with respiration noted  Abdomen: Soft, gravid, appropriate for gestational age. Pain/Pressure: Present     Pelvic:  Cervical exam deferred        Extremities: Normal range of motion.  Edema: None  Mental Status: Normal mood and affect. Normal behavior. Normal judgment and thought content.   Urinalysis:      Assessment and Plan:  Pregnancy: G4P3003 at [redacted]w[redacted]d  1. Diet controlled gestational diabetes mellitus (GDM) in third trimester On metformin 500 qhs and has normal am fasting. 2h pp lunch and dinner >25% in 120s-130s and a few elevated after breakfast. Will add 500 to  breakfast. Has rpt growth and bpp today. Continue with weekly testing - metFORMIN (GLUCOPHAGE) 500 MG tablet; ONE TAB PO WITH BREAKFAST AND BEFORE BED  Dispense: 60 tablet; Refill: 0  2. Supervision of high risk pregnancy, antepartum Doesn't want any more kids but doesn't want btl. D/w her re: LARC nv.  rn to ask hd for pap records  3. Multigravida of advanced maternal age in third trimester No issues  4. Anemia during pregnancy in third trimester Continue po ron  5. Language barrier Interpreter used  6. Elevated AFP See above.   7. Proteinuria in pregnancy, antepartum Rpt pc ratio pp  Preterm labor symptoms and general obstetric precautions including but not limited to vaginal bleeding, contractions, leaking of fluid and fetal movement were reviewed in detail with the patient. Please refer to After Visit Summary for other counseling recommendations.  Return in about 1 week (around 06/19/2019).   Aletha Halim, MD

## 2019-06-19 ENCOUNTER — Ambulatory Visit (INDEPENDENT_AMBULATORY_CARE_PROVIDER_SITE_OTHER): Payer: Medicaid Other | Admitting: General Practice

## 2019-06-19 ENCOUNTER — Other Ambulatory Visit: Payer: Self-pay

## 2019-06-19 ENCOUNTER — Encounter: Payer: Self-pay | Admitting: Obstetrics and Gynecology

## 2019-06-19 ENCOUNTER — Ambulatory Visit (INDEPENDENT_AMBULATORY_CARE_PROVIDER_SITE_OTHER): Payer: Medicaid Other | Admitting: Obstetrics and Gynecology

## 2019-06-19 ENCOUNTER — Ambulatory Visit: Payer: Self-pay

## 2019-06-19 VITALS — BP 93/66 | HR 98 | Wt 102.0 lb

## 2019-06-19 DIAGNOSIS — Z789 Other specified health status: Secondary | ICD-10-CM

## 2019-06-19 DIAGNOSIS — U071 COVID-19: Secondary | ICD-10-CM

## 2019-06-19 DIAGNOSIS — O98513 Other viral diseases complicating pregnancy, third trimester: Secondary | ICD-10-CM

## 2019-06-19 DIAGNOSIS — O09523 Supervision of elderly multigravida, third trimester: Secondary | ICD-10-CM

## 2019-06-19 DIAGNOSIS — O0993 Supervision of high risk pregnancy, unspecified, third trimester: Secondary | ICD-10-CM | POA: Diagnosis not present

## 2019-06-19 DIAGNOSIS — O24415 Gestational diabetes mellitus in pregnancy, controlled by oral hypoglycemic drugs: Secondary | ICD-10-CM | POA: Diagnosis not present

## 2019-06-19 DIAGNOSIS — O99013 Anemia complicating pregnancy, third trimester: Secondary | ICD-10-CM

## 2019-06-19 DIAGNOSIS — O0990 Supervision of high risk pregnancy, unspecified, unspecified trimester: Secondary | ICD-10-CM

## 2019-06-19 DIAGNOSIS — Z3A35 35 weeks gestation of pregnancy: Secondary | ICD-10-CM | POA: Diagnosis not present

## 2019-06-19 DIAGNOSIS — R772 Abnormality of alphafetoprotein: Secondary | ICD-10-CM

## 2019-06-19 NOTE — Progress Notes (Signed)
   PRENATAL VISIT NOTE  Subjective:  Tiffany [name] is a 38 y.o. G4P3003 at [redacted]w[redacted]d being seen today for ongoing prenatal care.  She is currently monitored for the following issues for this high-risk pregnancy and has Uterine mass; COVID-19 virus infection; Supervision of high risk pregnancy, antepartum; AMA (advanced maternal age) multigravida 35+; Elevated AFP; Anemia in pregnancy; GDM (gestational diabetes mellitus); Language barrier; and Proteinuria in pregnancy, antepartum on their problem list.  Patient reports no complaints.  Contractions: Not present. Vag. Bleeding: None.  Movement: Present. Denies leaking of fluid.   The following portions of the patient's history were reviewed and updated as appropriate: allergies, current medications, past family history, past medical history, past social history, past surgical history and problem list.   Objective:   Vitals:   06/19/19 1115  BP: 93/66  Pulse: 98  Weight: 102 lb (46.3 kg)    Fetal Status: Fetal Heart Rate (bpm): NST   Movement: Present     General:  Alert, oriented and cooperative. Patient is in no acute distress.  Skin: Skin is warm and dry. No rash noted.   Cardiovascular: Normal heart rate noted  Respiratory: Normal respiratory effort, no problems with respiration noted  Abdomen: Soft, gravid, appropriate for gestational age.  Pain/Pressure: Present     Pelvic: Cervical exam deferred        Extremities: Normal range of motion.  Edema: None  Mental Status: Normal mood and affect. Normal behavior. Normal judgment and thought content.   Assessment and Plan:  Pregnancy: G4P3003 at [redacted]w[redacted]d  1. Supervision of high risk pregnancy, antepartum  2. COVID-19 virus infection 01/2019  3. Multigravida of advanced maternal age in third trimester  4. Elevated AFP  5. Anemia during pregnancy in third trimester  6. Language barrier Santiago Glad interpretor used  7. Gestational diabetes mellitus (GDM) in third trimester controlled on oral  hypoglycemic drug Metformin 500 mg BID FG: 70-80s PP: 110s, occasional higher BPP 10/10 today F/u growth ordered today IOL for 39 weeks  Preterm labor symptoms and general obstetric precautions including but not limited to vaginal bleeding, contractions, leaking of fluid and fetal movement were reviewed in detail with the patient. Please refer to After Visit Summary for other counseling recommendations.   Return in about 1 week (around 06/26/2019) for as scheduled.  Future Appointments  Date Time Provider [place]  06/19/2019 11:35 AM WOC-C[facility] IMAGING WOC-CWHIMG WOC  06/26/2019  2:15 PM WOC-WOCA NST WOC-WOCA WOC  06/26/2019  3:15 PM Woodroe Mode, MD WOC-WOCA [place]  07/03/2019  9:15 AM WOC-WOCA NST WOC-WOCA WOC  07/03/2019 10:15 AM Wood[place] Mode, MD WOC-WOCA WOC  07/10/2019  9:15 AM WOC-WOCA NST WOC-WOCA WOC  07/10/2019 10:15 AM Sloan Leiter, MD [place] WOC  07/10/2019 11:00 AM [place] NURSE WH-MFC MFC-US  07/10/2019 11:00 AM [place] 3 WH-MFCUS MFC-US  07/17/2019  9:15 AM WOC-WOCA NST WOC-WOCA WOC  07/17/2019 10:15 AM Wood[place] Mode, MD WOC-WOCA WOC    Sloan Leiter, MD

## 2019-06-19 NOTE — Progress Notes (Signed)
Pt informed that the ultrasound is considered a limited OB ultrasound and is not intended to be a complete ultrasound exam.  Patient also informed that the ultrasound is not being completed with the intent of assessing for fetal or placental anomalies or any pelvic abnormalities.  Explained that the purpose of today's ultrasound is to assess for  BPP, presentation and AFI.  Patient acknowledges the purpose of the exam and the limitations of the study.    Scheduled OB f/u with BPP 11/5 @ 11am. Patient informed. Patient to see Dr Rosana Hoes today for OB visit.  Koren Bound RN BSN 06/19/19

## 2019-06-20 NOTE — Progress Notes (Signed)
I have reviewed this chart and agree with the RN/CMA assessment and management.    K. Meryl Breanda Greenlaw, M.D. Attending [facility] (Faculty Practice)   

## 2019-06-26 ENCOUNTER — Other Ambulatory Visit: Payer: Self-pay

## 2019-06-26 ENCOUNTER — Other Ambulatory Visit (HOSPITAL_COMMUNITY)
Admission: RE | Admit: 2019-06-26 | Discharge: 2019-06-26 | Disposition: A | Discharge status: Home / Self Care | Payer: Medicaid Other | Source: Ambulatory Visit | Attending: Obstetrics & Gynecology | Admitting: Obstetrics & Gynecology

## 2019-06-26 ENCOUNTER — Ambulatory Visit (INDEPENDENT_AMBULATORY_CARE_PROVIDER_SITE_OTHER): Payer: Medicaid Other | Admitting: General Practice

## 2019-06-26 ENCOUNTER — Ambulatory Visit (INDEPENDENT_AMBULATORY_CARE_PROVIDER_SITE_OTHER): Payer: Medicaid Other | Admitting: Obstetrics & Gynecology

## 2019-06-26 ENCOUNTER — Ambulatory Visit: Payer: Self-pay

## 2019-06-26 VITALS — BP 93/61 | HR 97 | Wt 103.0 lb

## 2019-06-26 DIAGNOSIS — O09523 Supervision of elderly multigravida, third trimester: Secondary | ICD-10-CM

## 2019-06-26 DIAGNOSIS — O24415 Gestational diabetes mellitus in pregnancy, controlled by oral hypoglycemic drugs: Secondary | ICD-10-CM

## 2019-06-26 DIAGNOSIS — O0993 Supervision of high risk pregnancy, unspecified, third trimester: Secondary | ICD-10-CM

## 2019-06-26 DIAGNOSIS — O0990 Supervision of high risk pregnancy, unspecified, unspecified trimester: Secondary | ICD-10-CM | POA: Insufficient documentation

## 2019-06-26 DIAGNOSIS — O24410 Gestational diabetes mellitus in pregnancy, diet controlled: Secondary | ICD-10-CM

## 2019-06-26 DIAGNOSIS — Z3A36 36 weeks gestation of pregnancy: Secondary | ICD-10-CM

## 2019-06-26 MED ORDER — METFORMIN HCL 500 MG PO TABS: ORAL_TABLET | ORAL | 0 refills | Status: DC

## 2019-06-26 NOTE — Progress Notes (Signed)
Pt informed that the ultrasound is considered a limited OB ultrasound and is not intended to be a complete ultrasound exam.  Patient also informed that the ultrasound is not being completed with the intent of assessing for fetal or placental anomalies or any pelvic abnormalities.  Explained that the purpose of today's ultrasound is to assess for  BPP, presentation and AFI.  Patient acknowledges the purpose of the exam and the limitations of the study.    NST/BPP today 10/10. Patient to see Dr Roselie Awkward for OB visit.  Koren Bound RN BSN 06/26/19

## 2019-06-26 NOTE — Progress Notes (Signed)
Patient needs refills on Metformin.

## 2019-06-26 NOTE — Patient Instructions (Signed)

## 2019-06-26 NOTE — Progress Notes (Signed)
   PRENATAL VISIT NOTE  Subjective:  Tiffany [name] is a 38 y.o. G4P3003 at [redacted]w[redacted]d being seen today for ongoing prenatal care.  She is currently monitored for the following issues for this high-risk pregnancy and has Uterine mass; COVID-19 virus infection; Supervision of high risk pregnancy, antepartum; AMA (advanced maternal age) multigravida 35+; Elevated AFP; Anemia in pregnancy; GDM (gestational diabetes mellitus); Language barrier; and Proteinuria in pregnancy, antepartum on their problem list.  Patient reports no complaints.  Contractions: Not present. Vag. Bleeding: None.  Movement: Present. Denies leaking of fluid.   The following portions of the patient's history were reviewed and updated as appropriate: allergies, current medications, past family history, past medical history, past social history, past surgical history and problem list.   Objective:   Vitals:   06/26/19 1455  BP: 93/61  Pulse: 97  Weight: 103 lb (46.7 kg)    Fetal Status: Fetal Heart Rate (bpm): NST   Movement: Present     General:  Alert, oriented and cooperative. Patient is in no acute distress.  Skin: Skin is warm and dry. No rash noted.   Cardiovascular: Normal heart rate noted  Respiratory: Normal respiratory effort, no problems with respiration noted  Abdomen: Soft, gravid, appropriate for gestational age.  Pain/Pressure: Present     Pelvic: Cervical exam performed         Extremities: Normal range of motion.  Edema: None  Mental Status: Normal mood and affect. Normal behavior. Normal judgment and thought content.   Assessment and Plan:  Pregnancy: G4P3003 at [redacted]w[redacted]d 1. Multigravida of advanced maternal age in third trimester BG control is acceptable on Metformin  2. Supervision of high risk pregnancy, antepartum  - GC/Chlamydia probe amp (Universal)not at [facility] - Culture, beta strep (group b only)  3. Gestational diabetes mellitus (GDM) in third trimester controlled on oral hypoglycemic drug   4.  Diet controlled gestational diabetes mellitus (GDM) in third trimester Refill given - metFORMIN (GLUCOPHAGE) 500 MG tablet; ONE TAB PO WITH BREAKFAST AND BEFORE BED  Dispense: 60 tablet; Refill: 0  Preterm labor symptoms and general obstetric precautions including but not limited to vaginal bleeding, contractions, leaking of fluid and fetal movement were reviewed in detail with the patient. Please refer to After Visit Summary for other counseling recommendations.   Return in about 1 week (around 07/03/2019).  Future Appointments  Date Time Provider [place]  07/03/2019  9:15 AM WOC-WOCA NST WOC-WOCA WOC  07/03/2019 10:15 AM [place] Mode, MD [place] WOC  07/10/2019  9:15 AM WOC-WOCA NST WOC-WOCA WOC  07/10/2019 10:15 AM Sloan Leiter, MD [place] WOC  07/10/2019 11:00 AM [place] NURSE [place] MFC-US  07/10/2019 11:00 AM [place] 3 WH-MFCUS MFC-US  07/17/2019  9:15 AM WOC-WOCA NST WOC-WOCA WOC  07/17/2019 10:15 AM Woodroe Mode, MD [facility]    Emeterio Reeve, MD

## 2019-06-29 LAB — CULTURE, BETA STREP (GROUP B ONLY): Strep Gp B Culture: NEGATIVE

## 2019-07-01 LAB — GC/CHLAMYDIA PROBE AMP (~~LOC~~) NOT AT ARMC
Chlamydia: NEGATIVE
Comment: NEGATIVE
Comment: NORMAL
Neisseria Gonorrhea: NEGATIVE

## 2019-07-03 ENCOUNTER — Ambulatory Visit: Payer: Self-pay

## 2019-07-03 ENCOUNTER — Ambulatory Visit (INDEPENDENT_AMBULATORY_CARE_PROVIDER_SITE_OTHER): Payer: Medicaid Other | Admitting: Obstetrics & Gynecology

## 2019-07-03 ENCOUNTER — Encounter: Payer: Self-pay | Admitting: Obstetrics & Gynecology

## 2019-07-03 ENCOUNTER — Other Ambulatory Visit: Payer: Self-pay

## 2019-07-03 ENCOUNTER — Ambulatory Visit (INDEPENDENT_AMBULATORY_CARE_PROVIDER_SITE_OTHER): Payer: Medicaid Other | Admitting: General Practice

## 2019-07-03 VITALS — BP 105/71 | HR 99 | Wt 104.0 lb

## 2019-07-03 DIAGNOSIS — O0990 Supervision of high risk pregnancy, unspecified, unspecified trimester: Secondary | ICD-10-CM

## 2019-07-03 DIAGNOSIS — O0993 Supervision of high risk pregnancy, unspecified, third trimester: Secondary | ICD-10-CM

## 2019-07-03 DIAGNOSIS — O24410 Gestational diabetes mellitus in pregnancy, diet controlled: Secondary | ICD-10-CM

## 2019-07-03 DIAGNOSIS — O09523 Supervision of elderly multigravida, third trimester: Secondary | ICD-10-CM

## 2019-07-03 DIAGNOSIS — Z3A37 37 weeks gestation of pregnancy: Secondary | ICD-10-CM

## 2019-07-03 DIAGNOSIS — O24415 Gestational diabetes mellitus in pregnancy, controlled by oral hypoglycemic drugs: Secondary | ICD-10-CM

## 2019-07-03 MED ORDER — METFORMIN HCL 500 MG PO TABS: ORAL_TABLET | ORAL | 0 refills | Status: DC

## 2019-07-03 NOTE — Progress Notes (Signed)
Pt informed that the ultrasound is considered a limited OB ultrasound and is not intended to be a complete ultrasound exam.  Patient also informed that the ultrasound is not being completed with the intent of assessing for fetal or placental anomalies or any pelvic abnormalities.  Explained that the purpose of today's ultrasound is to assess for  BPP, presentation and AFI.  Patient acknowledges the purpose of the exam and the limitations of the study.    NST/BPP today 8/10. Patient to see Dr Roselie Awkward for OB visit.  Koren Bound RN BSN 07/03/19

## 2019-07-03 NOTE — Progress Notes (Signed)
Scheduled IOL 11/9 morning

## 2019-07-03 NOTE — Patient Instructions (Signed)

## 2019-07-03 NOTE — Progress Notes (Signed)
   PRENATAL VISIT NOTE  Subjective:  Tiffany [name] is a 38 y.o. G4P3003 at [redacted]w[redacted]d being seen today for ongoing prenatal care.  She is currently monitored for the following issues for this high-risk pregnancy and has Uterine mass; COVID-19 virus infection; Supervision of high risk pregnancy, antepartum; AMA (advanced maternal age) multigravida 35+; Elevated AFP; Anemia in pregnancy; GDM (gestational diabetes mellitus); Language barrier; and Proteinuria in pregnancy, antepartum on their problem list.  Patient reports no complaints.  Contractions: Not present. Vag. Bleeding: None.  Movement: Present. Denies leaking of fluid.   The following portions of the patient's history were reviewed and updated as appropriate: allergies, current medications, past family history, past medical history, past social history, past surgical history and problem list.   Objective:   Vitals:   07/03/19 0955  BP: 105/71  Pulse: 99  Weight: 104 lb (47.2 kg)    Fetal Status: Fetal Heart Rate (bpm): NST   Movement: Present     General:  Alert, oriented and cooperative. Patient is in no acute distress.  Skin: Skin is warm and dry. No rash noted.   Cardiovascular: Normal heart rate noted  Respiratory: Normal respiratory effort, no problems with respiration noted  Abdomen: Soft, gravid, appropriate for gestational age.  Pain/Pressure: Present     Pelvic: Cervical exam deferred        Extremities: Normal range of motion.  Edema: None  Mental Status: Normal mood and affect. Normal behavior. Normal judgment and thought content.   Assessment and Plan:  Pregnancy: G4P3003 at [redacted]w[redacted]d 1. Supervision of high risk pregnancy, antepartum BPP 8/8, NST NR but 10/10 accels  2. Multigravida of advanced maternal age in third trimester IOL 13 week  3. Gestational diabetes mellitus (GDM) in third trimester controlled on oral hypoglycemic drug Continue present DM magmtFBS nl, most PP <120  4. Diet controlled gestational diabetes  mellitus (GDM) in third trimester  - metFORMIN (GLUCOPHAGE) 500 MG tablet; ONE TAB PO WITH BREAKFAST AND BEFORE BED  Dispense: 60 tablet; Refill: 0  Term labor symptoms and general obstetric precautions including but not limited to vaginal bleeding, contractions, leaking of fluid and fetal movement were reviewed in detail with the patient. Please refer to After Visit Summary for other counseling recommendations.   Return in about 1 week (around 07/10/2019).  Future Appointments  Date Time Provider [place]  07/03/2019 10:35 AM WOC-CWH IMAGING WOC-CWHIMG WOC  07/10/2019  9:15 AM WOC-WOCA NST WOC-WOCA WOC  07/10/2019 10:15 AM Sloan Leiter, MD [place] WOC  07/10/2019 11:00 AM [place] NURSE [place] MFC-US  07/10/2019 11:00 AM [place] 3 WH-MFCUS MFC-US  07/17/2019  9:15 AM WOC-WOCA NST WOC-WOCA WOC  07/17/2019 10:15 AM Woodroe Mode, MD [facility]    Emeterio Reeve, MD

## 2019-07-08 ENCOUNTER — Other Ambulatory Visit: Payer: Self-pay | Admitting: Advanced Practice Midwife

## 2019-07-08 ENCOUNTER — Encounter (HOSPITAL_COMMUNITY): Payer: Self-pay

## 2019-07-08 ENCOUNTER — Other Ambulatory Visit: Payer: Self-pay | Admitting: Obstetrics & Gynecology

## 2019-07-08 DIAGNOSIS — O24410 Gestational diabetes mellitus in pregnancy, diet controlled: Secondary | ICD-10-CM

## 2019-07-10 ENCOUNTER — Telehealth (HOSPITAL_COMMUNITY): Payer: Self-pay

## 2019-07-10 ENCOUNTER — Ambulatory Visit (HOSPITAL_COMMUNITY)
Admission: RE | Admit: 2019-07-10 | Discharge: 2019-07-10 | Disposition: A | Discharge status: Home / Self Care | Payer: Medicaid Other | Source: Ambulatory Visit | Attending: Obstetrics and Gynecology | Admitting: Obstetrics and Gynecology

## 2019-07-10 ENCOUNTER — Ambulatory Visit (INDEPENDENT_AMBULATORY_CARE_PROVIDER_SITE_OTHER): Payer: Medicaid Other | Admitting: Obstetrics and Gynecology

## 2019-07-10 ENCOUNTER — Ambulatory Visit (HOSPITAL_COMMUNITY): Payer: Medicaid Other | Admitting: *Deleted

## 2019-07-10 ENCOUNTER — Encounter: Payer: Self-pay | Admitting: Obstetrics and Gynecology

## 2019-07-10 ENCOUNTER — Other Ambulatory Visit: Payer: Self-pay

## 2019-07-10 ENCOUNTER — Other Ambulatory Visit (HOSPITAL_COMMUNITY): Payer: Self-pay | Admitting: Advanced Practice Midwife

## 2019-07-10 ENCOUNTER — Ambulatory Visit (INDEPENDENT_AMBULATORY_CARE_PROVIDER_SITE_OTHER): Payer: Medicaid Other | Admitting: General Practice

## 2019-07-10 VITALS — BP 109/71 | HR 91 | Wt 103.0 lb

## 2019-07-10 DIAGNOSIS — O24410 Gestational diabetes mellitus in pregnancy, diet controlled: Secondary | ICD-10-CM | POA: Insufficient documentation

## 2019-07-10 DIAGNOSIS — Z789 Other specified health status: Secondary | ICD-10-CM

## 2019-07-10 DIAGNOSIS — O24415 Gestational diabetes mellitus in pregnancy, controlled by oral hypoglycemic drugs: Secondary | ICD-10-CM

## 2019-07-10 DIAGNOSIS — O09523 Supervision of elderly multigravida, third trimester: Secondary | ICD-10-CM

## 2019-07-10 DIAGNOSIS — U071 COVID-19: Secondary | ICD-10-CM

## 2019-07-10 DIAGNOSIS — R772 Abnormality of alphafetoprotein: Secondary | ICD-10-CM

## 2019-07-10 DIAGNOSIS — Z362 Encounter for other antenatal screening follow-up: Secondary | ICD-10-CM | POA: Diagnosis not present

## 2019-07-10 DIAGNOSIS — O0990 Supervision of high risk pregnancy, unspecified, unspecified trimester: Secondary | ICD-10-CM | POA: Insufficient documentation

## 2019-07-10 DIAGNOSIS — Z3A38 38 weeks gestation of pregnancy: Secondary | ICD-10-CM

## 2019-07-10 DIAGNOSIS — O0993 Supervision of high risk pregnancy, unspecified, third trimester: Secondary | ICD-10-CM

## 2019-07-10 NOTE — Progress Notes (Signed)
   PRENATAL VISIT NOTE  Subjective:  Tiffany [name] is a 38 y.o. G4P3003 at [redacted]w[redacted]d being seen today for ongoing prenatal care.  She is currently monitored for the following issues for this high-risk pregnancy and has Uterine mass; COVID-19 virus infection; Supervision of high risk pregnancy, antepartum; AMA (advanced maternal age) multigravida 35+; Elevated AFP; Anemia in pregnancy; GDM (gestational diabetes mellitus); Language barrier; and Proteinuria in pregnancy, antepartum on their problem list.  Patient reports no complaints.  Contractions: Irritability. Vag. Bleeding: None.  Movement: Present. Denies leaking of fluid.   The following portions of the patient's history were reviewed and updated as appropriate: allergies, current medications, past family history, past medical history, past social history, past surgical history and problem list.   Objective:   Vitals:   07/10/19 0932  BP: 109/71  Pulse: 91  Weight: 103 lb (46.7 kg)    Fetal Status: Fetal Heart Rate (bpm): NST   Movement: Present     General:  Alert, oriented and cooperative. Patient is in no acute distress.  Skin: Skin is warm and dry. No rash noted.   Cardiovascular: Normal heart rate noted  Respiratory: Normal respiratory effort, no problems with respiration noted  Abdomen: Soft, gravid, appropriate for gestational age.  Pain/Pressure: Present     Pelvic: Cervical exam deferred        Extremities: Normal range of motion.  Edema: None  Mental Status: Normal mood and affect. Normal behavior. Normal judgment and thought content.   Assessment and Plan:  Pregnancy: G4P3003 at [redacted]w[redacted]d  1. Supervision of high risk pregnancy, antepartum Increase fiber in diet  2. COVID-19 virus infection 01/2019  3. Elevated AFP  4. Language barrier Interpretor used  5. Gestational diabetes mellitus (GDM) in third trimester controlled on oral hypoglycemic drug Cont metformin FG well controlled, occasional PP outlier Has BPP with MFM  today IOL scheduled previously for 39 weeks  Term labor symptoms and general obstetric precautions including but not limited to vaginal bleeding, contractions, leaking of fluid and fetal movement were reviewed in detail with the patient. Please refer to After Visit Summary for other counseling recommendations.   Return in about 5 weeks (around 08/14/2019) for post partum check.  Future Appointments  Date Time Provider [place]  07/10/2019 10:15 AM Sloan Leiter, MD [place]  07/10/2019 11:00 AM [place] NURSE [place] MFC-US  07/10/2019 11:00 AM [place] 3 WH-MFCUS MFC-US  07/14/2019  8:00 AM MC-LD [place] MC-INDC None  07/17/2019  9:15 AM WOC-WOCA NST WOC-WOCA WOC  07/17/2019 10:15 AM Woodroe Mode, MD [facility]    Sloan Leiter, MD

## 2019-07-10 NOTE — Progress Notes (Signed)
Patient reports stool softeners are not helping, despite taking BID

## 2019-07-10 NOTE — Progress Notes (Signed)
RN attempted to call for preadmission screen with [place] Glad interpreter [phone], no answer from patient

## 2019-07-12 ENCOUNTER — Other Ambulatory Visit (HOSPITAL_COMMUNITY): Admission: RE | Admit: 2019-07-12 | Payer: Medicaid Other | Source: Ambulatory Visit

## 2019-07-14 ENCOUNTER — Inpatient Hospital Stay (HOSPITAL_COMMUNITY): Payer: Medicaid Other

## 2019-07-14 ENCOUNTER — Inpatient Hospital Stay (HOSPITAL_COMMUNITY)
Admission: AD | Admit: 2019-07-14 | Discharge: 2019-07-19 | DRG: 786 | Disposition: A | Discharge status: Home / Self Care | Payer: Medicaid Other | Attending: Obstetrics and Gynecology | Admitting: Obstetrics and Gynecology

## 2019-07-14 ENCOUNTER — Encounter (HOSPITAL_COMMUNITY): Payer: Self-pay

## 2019-07-14 ENCOUNTER — Other Ambulatory Visit: Payer: Self-pay

## 2019-07-14 ENCOUNTER — Inpatient Hospital Stay (HOSPITAL_COMMUNITY): Payer: Medicaid Other | Admitting: Anesthesiology

## 2019-07-14 DIAGNOSIS — N179 Acute kidney failure, unspecified: Secondary | ICD-10-CM | POA: Diagnosis not present

## 2019-07-14 DIAGNOSIS — I509 Heart failure, unspecified: Secondary | ICD-10-CM | POA: Diagnosis not present

## 2019-07-14 DIAGNOSIS — Z20828 Contact with and (suspected) exposure to other viral communicable diseases: Secondary | ICD-10-CM | POA: Diagnosis present

## 2019-07-14 DIAGNOSIS — O26833 Pregnancy related renal disease, third trimester: Secondary | ICD-10-CM | POA: Diagnosis present

## 2019-07-14 DIAGNOSIS — N170 Acute kidney failure with tubular necrosis: Secondary | ICD-10-CM | POA: Diagnosis not present

## 2019-07-14 DIAGNOSIS — E872 Acidosis: Secondary | ICD-10-CM | POA: Diagnosis present

## 2019-07-14 DIAGNOSIS — Z22322 Carrier or suspected carrier of Methicillin resistant Staphylococcus aureus: Secondary | ICD-10-CM | POA: Diagnosis not present

## 2019-07-14 DIAGNOSIS — Z98891 History of uterine scar from previous surgery: Secondary | ICD-10-CM

## 2019-07-14 DIAGNOSIS — O411230 Chorioamnionitis, third trimester, not applicable or unspecified: Secondary | ICD-10-CM | POA: Diagnosis not present

## 2019-07-14 DIAGNOSIS — R58 Hemorrhage, not elsewhere classified: Secondary | ICD-10-CM | POA: Diagnosis not present

## 2019-07-14 DIAGNOSIS — O24425 Gestational diabetes mellitus in childbirth, controlled by oral hypoglycemic drugs: Principal | ICD-10-CM | POA: Diagnosis present

## 2019-07-14 DIAGNOSIS — O99284 Endocrine, nutritional and metabolic diseases complicating childbirth: Secondary | ICD-10-CM | POA: Diagnosis present

## 2019-07-14 DIAGNOSIS — O24419 Gestational diabetes mellitus in pregnancy, unspecified control: Secondary | ICD-10-CM | POA: Diagnosis present

## 2019-07-14 DIAGNOSIS — R0602 Shortness of breath: Secondary | ICD-10-CM | POA: Diagnosis not present

## 2019-07-14 DIAGNOSIS — O99013 Anemia complicating pregnancy, third trimester: Secondary | ICD-10-CM | POA: Diagnosis not present

## 2019-07-14 DIAGNOSIS — Z90710 Acquired absence of both cervix and uterus: Secondary | ICD-10-CM | POA: Diagnosis not present

## 2019-07-14 DIAGNOSIS — D62 Acute posthemorrhagic anemia: Secondary | ICD-10-CM | POA: Diagnosis not present

## 2019-07-14 DIAGNOSIS — Z789 Other specified health status: Secondary | ICD-10-CM | POA: Diagnosis present

## 2019-07-14 DIAGNOSIS — Z3A39 39 weeks gestation of pregnancy: Secondary | ICD-10-CM

## 2019-07-14 DIAGNOSIS — O0990 Supervision of high risk pregnancy, unspecified, unspecified trimester: Secondary | ICD-10-CM

## 2019-07-14 DIAGNOSIS — D65 Disseminated intravascular coagulation [defibrination syndrome]: Secondary | ICD-10-CM | POA: Diagnosis not present

## 2019-07-14 DIAGNOSIS — O4690 Antepartum hemorrhage, unspecified, unspecified trimester: Secondary | ICD-10-CM | POA: Diagnosis not present

## 2019-07-14 DIAGNOSIS — O9912 Other diseases of the blood and blood-forming organs and certain disorders involving the immune mechanism complicating childbirth: Secondary | ICD-10-CM | POA: Diagnosis present

## 2019-07-14 DIAGNOSIS — O411290 Chorioamnionitis, unspecified trimester, not applicable or unspecified: Secondary | ICD-10-CM | POA: Diagnosis not present

## 2019-07-14 DIAGNOSIS — O09529 Supervision of elderly multigravida, unspecified trimester: Secondary | ICD-10-CM

## 2019-07-14 DIAGNOSIS — I361 Nonrheumatic tricuspid (valve) insufficiency: Secondary | ICD-10-CM | POA: Diagnosis not present

## 2019-07-14 DIAGNOSIS — O9081 Anemia of the puerperium: Secondary | ICD-10-CM | POA: Diagnosis not present

## 2019-07-14 DIAGNOSIS — O43813 Placental infarction, third trimester: Secondary | ICD-10-CM | POA: Diagnosis not present

## 2019-07-14 DIAGNOSIS — R0902 Hypoxemia: Secondary | ICD-10-CM

## 2019-07-14 DIAGNOSIS — O1210 Gestational proteinuria, unspecified trimester: Secondary | ICD-10-CM | POA: Diagnosis present

## 2019-07-14 DIAGNOSIS — O99019 Anemia complicating pregnancy, unspecified trimester: Secondary | ICD-10-CM | POA: Diagnosis present

## 2019-07-14 DIAGNOSIS — N800 Endometriosis of uterus: Secondary | ICD-10-CM | POA: Diagnosis not present

## 2019-07-14 DIAGNOSIS — Z758 Other problems related to medical facilities and other health care: Secondary | ICD-10-CM | POA: Diagnosis present

## 2019-07-14 LAB — CBC
HCT: 34.5 % — ABNORMAL LOW (ref 36.0–46.0)
Hemoglobin: 11.6 g/dL — ABNORMAL LOW (ref 12.0–15.0)
MCH: 31 pg (ref 26.0–34.0)
MCHC: 33.6 g/dL (ref 30.0–36.0)
MCV: 92.2 fL (ref 80.0–100.0)
Platelets: 263 10*3/uL (ref 150–400)
RBC: 3.74 MIL/uL — ABNORMAL LOW (ref 3.87–5.11)
RDW: 13.9 % (ref 11.5–15.5)
WBC: 9.9 10*3/uL (ref 4.0–10.5)
nRBC: 0 % (ref 0.0–0.2)

## 2019-07-14 LAB — GLUCOSE, CAPILLARY
Glucose-Capillary: 63 mg/dL — ABNORMAL LOW (ref 70–99)
Glucose-Capillary: 98 mg/dL (ref 70–99)
Glucose-Capillary: 92 mg/dL (ref 70–99)
Glucose-Capillary: 97 mg/dL (ref 70–99)
Glucose-Capillary: 73 mg/dL (ref 70–99)

## 2019-07-14 LAB — SARS CORONAVIRUS 2 (TAT 6-24 HRS): SARS Coronavirus 2: NEGATIVE

## 2019-07-14 LAB — RPR: RPR Ser Ql: NONREACTIVE

## 2019-07-14 LAB — ABO/RH: ABO/RH(D): O POS

## 2019-07-14 MED ORDER — ONDANSETRON HCL 4 MG/2ML IJ SOLN: 4.0000 mg | Freq: Four times a day (QID) | INTRAMUSCULAR | Status: DC | PRN

## 2019-07-14 MED ORDER — LACTATED RINGERS IV SOLN: 500.0000 mL | Freq: Once | INTRAVENOUS | Status: DC

## 2019-07-14 MED ORDER — LACTATED RINGERS IV SOLN
500.0000 mL | INTRAVENOUS | Status: DC | PRN
  Administered 2019-07-14 – 2019-07-15 (×4): 500 mL via INTRAVENOUS

## 2019-07-14 MED ORDER — OXYCODONE-ACETAMINOPHEN 5-325 MG PO TABS: 2.0000 | ORAL_TABLET | ORAL | Status: DC | PRN

## 2019-07-14 MED ORDER — OXYTOCIN 40 UNITS IN NORMAL SALINE INFUSION - SIMPLE MED
1.0000 m[IU]/min | INTRAVENOUS | Status: DC
  Administered 2019-07-14: 23:00:00 2 m[IU]/min via INTRAVENOUS

## 2019-07-14 MED ORDER — TERBUTALINE SULFATE 1 MG/ML IJ SOLN: 0.2500 mg | Freq: Once | INTRAMUSCULAR | Status: DC | PRN

## 2019-07-14 MED ORDER — EPHEDRINE 5 MG/ML INJ: 10.0000 mg | INTRAVENOUS | Status: DC | PRN

## 2019-07-14 MED ORDER — LACTATED RINGERS IV SOLN
INTRAVENOUS | Status: DC
  Administered 2019-07-14 – 2019-07-15 (×9): via INTRAVENOUS

## 2019-07-14 MED ORDER — LIDOCAINE HCL (PF) 1 % IJ SOLN: 30.0000 mL | INTRAMUSCULAR | Status: DC | PRN

## 2019-07-14 MED ORDER — FENTANYL-BUPIVACAINE-NACL 0.5-0.125-0.9 MG/250ML-% EP SOLN: 12.0000 mL/h | EPIDURAL | Status: DC | PRN

## 2019-07-14 MED ORDER — PHENYLEPHRINE 40 MCG/ML (10ML) SYRINGE FOR IV PUSH (FOR BLOOD PRESSURE SUPPORT)
80.0000 ug | PREFILLED_SYRINGE | INTRAVENOUS | Status: DC | PRN
  Administered 2019-07-15 (×2): 80 ug via INTRAVENOUS

## 2019-07-14 MED ORDER — SODIUM CHLORIDE (PF) 0.9 % IJ SOLN
INTRAMUSCULAR | Status: DC | PRN
  Administered 2019-07-14: 10 mL/h via EPIDURAL

## 2019-07-14 MED ORDER — SOD CITRATE-CITRIC ACID 500-334 MG/5ML PO SOLN
30.0000 mL | ORAL | Status: DC | PRN
  Administered 2019-07-15: 19:00:00 30 mL via ORAL
  Filled 2019-07-14: qty 30

## 2019-07-14 MED ORDER — PHENYLEPHRINE 40 MCG/ML (10ML) SYRINGE FOR IV PUSH (FOR BLOOD PRESSURE SUPPORT): 80.0000 ug | PREFILLED_SYRINGE | INTRAVENOUS | Status: DC | PRN

## 2019-07-14 MED ORDER — OXYTOCIN BOLUS FROM INFUSION: 500.0000 mL | Freq: Once | INTRAVENOUS | Status: DC

## 2019-07-14 MED ORDER — OXYTOCIN 40 UNITS IN NORMAL SALINE INFUSION - SIMPLE MED
2.5000 [IU]/h | INTRAVENOUS | Status: DC
  Filled 2019-07-14: qty 1000

## 2019-07-14 MED ORDER — OXYCODONE-ACETAMINOPHEN 5-325 MG PO TABS: 1.0000 | ORAL_TABLET | ORAL | Status: DC | PRN

## 2019-07-14 MED ORDER — DIPHENHYDRAMINE HCL 50 MG/ML IJ SOLN
12.5000 mg | INTRAMUSCULAR | Status: DC | PRN
  Filled 2019-07-14: qty 1

## 2019-07-14 MED ORDER — LIDOCAINE HCL (PF) 1 % IJ SOLN
INTRAMUSCULAR | Status: DC | PRN
  Administered 2019-07-14: 3 mL via EPIDURAL
  Administered 2019-07-14: 4 mL via EPIDURAL

## 2019-07-14 MED ORDER — TERBUTALINE SULFATE 1 MG/ML IJ SOLN
0.2500 mg | Freq: Once | INTRAMUSCULAR | Status: AC | PRN
  Administered 2019-07-15: 0.25 mg via SUBCUTANEOUS
  Filled 2019-07-14: qty 1

## 2019-07-14 MED ORDER — FENTANYL-BUPIVACAINE-NACL 0.5-0.125-0.9 MG/250ML-% EP SOLN
EPIDURAL | Status: AC
  Filled 2019-07-14: qty 250

## 2019-07-14 MED ORDER — MISOPROSTOL 50MCG HALF TABLET
50.0000 ug | ORAL_TABLET | ORAL | Status: DC | PRN
  Administered 2019-07-14: 10:00:00 50 ug via BUCCAL
  Filled 2019-07-14: qty 1

## 2019-07-14 MED ORDER — ACETAMINOPHEN 325 MG PO TABS: 650.0000 mg | ORAL_TABLET | ORAL | Status: DC | PRN

## 2019-07-14 MED ORDER — FENTANYL CITRATE (PF) 100 MCG/2ML IJ SOLN
100.0000 ug | INTRAMUSCULAR | Status: DC | PRN
  Administered 2019-07-14 (×2): 100 ug via INTRAVENOUS
  Filled 2019-07-14 (×2): qty 2

## 2019-07-14 NOTE — Progress Notes (Signed)
LABOR PROGRESS NOTE  Valisha Clarin is a 38 y.o. [id number] at [redacted]w[redacted]d  admitted for IOL for [place].  Subjective: Patient is feeling more more pressure at this time.  She questions how much longer she has until she has a baby.   Objective: BP (!) 90/50   Pulse 95   Temp 98.1 F (36.7 C) (Oral)   Resp 18   Ht 4\' 9"  (1.448 m)   Wt 48.4 kg   LMP 11/08/2018 (Exact Date)   BMI 23.11 kg/m  or  Vitals:   07/14/19 1203 07/14/19 1349 07/14/19 1605 07/14/19 1749  BP: 112/72 111/79 (!) 94/51 (!) 90/50  Pulse: 96 96 91 95  Resp: 17 17 18 18   Temp:  98.1 F (36.7 C)  98.1 F (36.7 C)  TempSrc:  Oral  Oral  Weight:      Height:         Dilation: 2.5(external OS) Effacement (%): 40, 50 Station: -3 Presentation: Vertex Exam by:: Dr. Caron Presume FHT: baseline rate 140, moderate varibility, + acel, no decel Toco: Every 1-2 minutes  Labs: Lab Results  Component Value Date   WBC 9.9 07/14/2019   HGB 11.6 (L) 07/14/2019   HCT 34.5 (L) 07/14/2019   MCV 92.2 07/14/2019   PLT 263 07/14/2019    Patient Active Problem List   Diagnosis Date Noted  . Gestational diabetes mellitus (GDM), antepartum 07/14/2019  . [redacted] weeks gestation of pregnancy 07/14/2019  . Proteinuria in pregnancy, antepartum 03/31/2019  . Supervision of high risk pregnancy, antepartum 03/26/2019  . AMA (advanced maternal age) multigravida 35+ 03/26/2019  . Elevated AFP 03/26/2019  . Anemia in pregnancy 03/26/2019  . GDM (gestational diabetes mellitus) 03/26/2019  . Language barrier 03/26/2019  . COVID-19 virus infection 01/15/2019  . Uterine mass 05/15/2014    Assessment / Plan: 38 y.o. [name] at [redacted]w[redacted]d here for IOL for GDM A2  Labor: s/p Cytotec x1.  Patient is contracting frequently.  Foley bulb still in place.  Will reassess when Foley bulb falls out Fetal Wellbeing: Category 1, reassuring Pain Control: IV pain medicine at this time epidural if requested Anticipated MOD: Vaginal delivery  GDM A2: -Most recent blood  glucose 92 -Patient had low blood sugar on arrival of 63.  Was given juice and came up to 98. -Continue every 4 hour blood glucose while in latent labor and increase to every 2 hour glucose when in active labor  Gifford Shave, MD  PGY-1, [facility]  07/14/2019, 5:51 PM

## 2019-07-14 NOTE — Progress Notes (Signed)
Hypoglycemic Event  CBG: 63  Treatment: 8 oz juice   Symptoms: None  Follow-up CBG: Time:1016 CBG Result:98  Possible Reasons for Event: Unknown  Comments/MD notified:Dr. Cresenzo notified at 0957; no new orders received     Lanny Cramp

## 2019-07-14 NOTE — Progress Notes (Signed)
LABOR PROGRESS NOTE  Shamica Struthers is a 38 y.o. [id number] at [redacted]w[redacted]d  admitted for IOL for [place].  Subjective: Patient is doing well at this time with epidural in placed  Objective: BP (!) 93/57   Pulse 76   Temp 98 F (36.7 C) (Oral)   Resp 14   Ht 4\' 9"  (1.448 m)   Wt 48.4 kg   LMP 11/08/2018 (Exact Date)   SpO2 100%   BMI 23.11 kg/m  or  Vitals:   07/14/19 2201 07/14/19 2205 07/14/19 2210 07/14/19 2230  BP: (!) 87/53 92/61 97/62  (!) 93/57  Pulse: 91 100 (!) 104 76  Resp: 14 14  14   Temp:    98 F (36.7 C)  TempSrc:    Oral  SpO2: 99% 99% 99% 100%  Weight:      Height:         Dilation: 4 Effacement (%): 50 Cervical Position: Middle Station: -2 Presentation: Vertex Exam by:: Sia Johnny RN, Bri Price RN FHT: baseline rate 140, moderate varibility, + acel, no decel Toco: Every 1-2 minutes  Labs: Lab Results  Component Value Date   WBC 9.9 07/14/2019   HGB 11.6 (L) 07/14/2019   HCT 34.5 (L) 07/14/2019   MCV 92.2 07/14/2019   PLT 263 07/14/2019    Patient Active Problem List   Diagnosis Date Noted  . Gestational diabetes mellitus (GDM), antepartum 07/14/2019  . [redacted] weeks gestation of pregnancy 07/14/2019  . Proteinuria in pregnancy, antepartum 03/31/2019  . Supervision of high risk pregnancy, antepartum 03/26/2019  . AMA (advanced maternal age) multigravida 35+ 03/26/2019  . Elevated AFP 03/26/2019  . Anemia in pregnancy 03/26/2019  . GDM (gestational diabetes mellitus) 03/26/2019  . Language barrier 03/26/2019  . COVID-19 virus infection 01/15/2019  . Uterine mass 05/15/2014    Assessment / Plan: 38 y.o. [name] at [redacted]w[redacted]d here for IOL for GDM A2  Labor: s/p Cytotec x1 and foley out. Restarted pitocin low doses given unchanged exam.  Fetal Wellbeing: Category 1, reassuring Pain Control: Epidural  Anticipated MOD: Vaginal delivery  GDM A2: -Most recent blood glucose 73 -Continue every 4 hour blood glucose while in latent labor and increase to every 2 hour  glucose when in active labor  Maxie Better, MD, PGY1 OBGYN Faculty Teaching Service  07/14/2019, 10:51 PM

## 2019-07-14 NOTE — Anesthesia Preprocedure Evaluation (Signed)
Anesthesia Evaluation  Patient identified by MRN, date of birth, ID band Patient awake    Reviewed: Allergy & Precautions, Patient's Chart, lab work & pertinent test results  Airway Mallampati: II  TM Distance: >3 FB Neck ROM: Full    Dental no notable dental hx. (+) Teeth Intact   Pulmonary neg pulmonary ROS,    Pulmonary exam normal breath sounds clear to auscultation       Cardiovascular negative cardio ROS Normal cardiovascular exam Rhythm:Regular Rate:Normal     Neuro/Psych negative neurological ROS  negative psych ROS   GI/Hepatic Neg liver ROS, GERD  ,  Endo/Other  diabetes, Well Controlled, Gestational, Oral Hypoglycemic Agents  Renal/GU Renal diseaseHx/o proteinuria in pregnancy  negative genitourinary   Musculoskeletal negative musculoskeletal ROS (+)   Abdominal   Peds  Hematology  (+) anemia ,   Anesthesia Other Findings   Reproductive/Obstetrics (+) Pregnancy AMA                             Anesthesia Physical Anesthesia Plan  ASA: II  Anesthesia Plan: Epidural   Post-op Pain Management:    Induction:   PONV Risk Score and Plan:   Airway Management Planned: Natural Airway  Additional Equipment:   Intra-op Plan:   Post-operative Plan:   Informed Consent: I have reviewed the patients History and Physical, chart, labs and discussed the procedure including the risks, benefits and alternatives for the proposed anesthesia with the patient or authorized representative who has indicated his/her understanding and acceptance.       Plan Discussed with: Anesthesiologist  Anesthesia Plan Comments:         Anesthesia Quick Evaluation

## 2019-07-14 NOTE — H&P (Addendum)
LABOR AND DELIVERY ADMISSION HISTORY AND PHYSICAL NOTE  Tiffany [name] is a 38 y.o. female [phone] with IUP at 23w0dby LMP +25-week ultrasound presenting for IOL for GDMA2.   An interpreter was used for this interview  Patient reports she is doing well.  She denies any current issues.  She reports this is technically her fifth pregnancy but that she has 3 children at home.  Her first pregnancy was a miscarriage.  She says that her largest baby was 3500 g.  Patient denies any complications this pregnancy other than her diabetes.  Patient says that she wants to breast-feed if possible but will supplement with formula if necessary.  She wants to use progesterone only contraception after delivery.  She says that she may want an epidural but is unsure at this time and wants to hold off right now.  She reports positive fetal movement. She denies leakage of fluid or vaginal bleeding.  Prenatal History/Complications: GDMA2 Language barrier COVID infection in May 2020  Past Medical History: Past Medical History:  Diagnosis Date  . Appendicitis   . COVID-19 01/2019  . Medical history non-contributory     Past Surgical History: Past Surgical History:  Procedure Laterality Date  . LAPAROSCOPIC APPENDECTOMY N/A 04/20/2014   Procedure: APPENDECTOMY LAPAROSCOPIC;  Surgeon: ALeighton Ruff MD;  Location: [facility];  Service: General;  Laterality: N/A;    Obstetrical History: OB History    Gravida  4   Para  3   Term  3   Preterm      AB      Living  3     SAB      TAB      Ectopic      Multiple      Live Births  3        Obstetric Comments  SVD x 3        Social History: Social History   Socioeconomic History  . Marital status: Single    Spouse name: Not on file  . Number of children: 3  . Years of education: 4   . Highest education level: Not on file  Occupational History  . Occupation: Unemployed     Employer: [employer] . Financial resource strain: Not on  file  . Food insecurity    Worry: Not on file    Inability: Not on file  . Transportation needs    Medical: Not on file    Non-medical: Not on file  Tobacco Use  . Smoking status: Never Smoker  . Smokeless tobacco: Never Used  Substance and Sexual Activity  . Alcohol use: No  . Drug use: No  . Sexual activity: Yes    Birth control/protection: None  Lifestyle  . Physical activity    Days per week: Not on file    Minutes per session: Not on file  . Stress: Not on file  Relationships  . Social [place] phone: Not on file    Gets together: Not on file    Attends religious service: Not on file    Active member of club or organization: Not on file    Attends meetings of clubs or organizations: Not on file    Relationship status: Not on file  Other Topics Concern  . Not on file  Social History Narrative   [place]   Live with 3 children, husband, brother.  Family History: Family History  Problem Relation Age of Onset  . Cancer Mother   . Heart disease Father     Allergies: No Known Allergies  Medications Prior to Admission  Medication Sig Dispense Refill Last Dose  . Accu-Chek FastClix Lancets MISC 1 each by Percutaneous route 4 (four) times daily. 100 each 12   . aspirin EC 81 MG tablet Take 1 tablet (81 mg total) by mouth daily. 60 tablet 2   . Blood Glucose Monitoring Suppl (ACCU-CHEK GUIDE) w/Device KIT 1 Device by Does not apply route 4 (four) times daily. 1 kit 0   . docusate sodium (COLACE) 100 MG capsule Take 1 capsule (100 mg total) by mouth 2 (two) times daily. 60 capsule 1   . ferrous sulfate 325 (65 FE) MG tablet Take 1 tablet (325 mg total) by mouth 3 (three) times daily with meals. 90 tablet 3   . glucose blood (ACCU-CHEK GUIDE) test strip Use as instructed 50 each 12   . metFORMIN (GLUCOPHAGE) 500 MG tablet ONE TAB PO WITH BREAKFAST AND BEFORE BED 60 tablet 0   . Prenatal Vit-Fe Fumarate-FA (PREPLUS) 27-1  MG TABS Take 1 tablet by mouth daily. 60 tablet 3      Review of Systems   All systems reviewed and negative except as stated in HPI  Last menstrual period 11/08/2018. General appearance: alert, cooperative, appears stated age and no distress Lungs: clear to auscultation bilaterally Heart: regular rate and rhythm Abdomen: soft, non-tender; bowel sounds normal Extremities: No calf swelling or tenderness Presentation: cephalic by sutures  Fetal monitoring: 135 bpm, accelerations, no decelerations Uterine activity: Difficult to trace at this time     Prenatal labs: ABO, Rh: O/Positive/-- (07/06 0000) Antibody: Negative (07/06 0000) Rubella: Immune (07/06 0000) RPR: Non Reactive (08/13 1649)  HBsAg: Negative (07/06 0000)  HIV: Non Reactive (08/13 1649)  GBS: Negative/-- (10/22 1625)  1 hr Glucola: GDMA2 Genetic screening: Negative except for elevated AFP Anatomy US: Normal  Prenatal Transfer Tool  Maternal Diabetes: Yes:  Diabetes Type:  Insulin/Medication controlled Genetic Screening: Abnormal:  Results: Elevated AFP Maternal Ultrasounds/Referrals: Normal Fetal Ultrasounds or other Referrals:  None Maternal Substance Abuse:  No Significant Maternal Medications:  None Significant Maternal Lab Results: Group B Strep negative  No results found for this or any previous visit (from the past 24 hour(s)).  Patient Active Problem List   Diagnosis Date Noted  . Gestational diabetes mellitus (GDM), antepartum 07/14/2019  . Proteinuria in pregnancy, antepartum 03/31/2019  . Supervision of high risk pregnancy, antepartum 03/26/2019  . AMA (advanced maternal age) multigravida 35+ 03/26/2019  . Elevated AFP 03/26/2019  . Anemia in pregnancy 03/26/2019  . GDM (gestational diabetes mellitus) 03/26/2019  . Language barrier 03/26/2019  . COVID-19 virus infection 01/15/2019  . Uterine mass 05/15/2014    Assessment: Tiffany [name] is a 38 y.o. H6P5916 at 30w0dhere for IOL for GDM  A2  #Labor: Cervix is fingertip thick and high.  Will ripening using Cytotec 50 mg buccal and recheck cervix in 4 hours. #Pain: Is unsure at this time.  Is considering epidural but has not used anything for previous deliveries. #FWB: Cat 1, reassuring #ID:  GBS negative #MOF: Breast #MOC: POPs  GDMA2-blood glucose on arrival was 62.  Patient is given juice.  We will do every 4 hour blood sugars while in latent labor and every 2 hour blood sugars while in active labor.  VGifford Shave11/05/2019, 8:48 AM  I saw and evaluated the  patient. I agree with the findings and the plan of care as documented in the resident's note. Edits and additions made in blue as above. EFW 3200g. Proven to 3500g.  Barrington Ellison, MD [facility] Fellow, [facility] for [employer], [place]

## 2019-07-14 NOTE — Progress Notes (Signed)
LABOR PROGRESS NOTE  Tiffany [name] is a 38 y.o. [id number] at [redacted]w[redacted]d  admitted for IOL for [place].  Subjective: Patient is doing well at this time.  She requests epidural.   Objective: BP 111/62   Pulse 91   Temp 98.1 F (36.7 C) (Oral)   Resp 16   Ht 4\' 9"  (1.448 m)   Wt 48.4 kg   LMP 11/08/2018 (Exact Date)   BMI 23.11 kg/m  or  Vitals:   07/14/19 1605 07/14/19 1749 07/14/19 1904 07/14/19 1953  BP: (!) 94/51 (!) 90/50 (!) 94/56 111/62  Pulse: 91 95 91 91  Resp: 18 18 17 16   Temp:  98.1 F (36.7 C)  98.1 F (36.7 C)  TempSrc:  Oral  Oral  Weight:      Height:         Dilation: 4 Effacement (%): 50 Station: -2 Presentation: Vertex Exam by:: Dr. Caron Presume FHT: baseline rate 140, moderate varibility, + acel, no decel Toco: Every 1-2 minutes  Labs: Lab Results  Component Value Date   WBC 9.9 07/14/2019   HGB 11.6 (L) 07/14/2019   HCT 34.5 (L) 07/14/2019   MCV 92.2 07/14/2019   PLT 263 07/14/2019    Patient Active Problem List   Diagnosis Date Noted  . Gestational diabetes mellitus (GDM), antepartum 07/14/2019  . [redacted] weeks gestation of pregnancy 07/14/2019  . Proteinuria in pregnancy, antepartum 03/31/2019  . Supervision of high risk pregnancy, antepartum 03/26/2019  . AMA (advanced maternal age) multigravida 35+ 03/26/2019  . Elevated AFP 03/26/2019  . Anemia in pregnancy 03/26/2019  . GDM (gestational diabetes mellitus) 03/26/2019  . Language barrier 03/26/2019  . COVID-19 virus infection 01/15/2019  . Uterine mass 05/15/2014    Assessment / Plan: 38 y.o. [name] at [redacted]w[redacted]d here for IOL for GDM A2  Labor: s/p Cytotec x1 and foley out. Will reassess for augmentation with Pit after epidural. Contractions were previously adequate and augmentation may not be needed.  Fetal Wellbeing: Category 1, reassuring Pain Control: Epidural requested  Anticipated MOD: Vaginal delivery  GDM A2: -Most recent blood glucose 97 -Continue every 4 hour blood glucose while in latent  labor and increase to every 2 hour glucose when in active labor  Maxie Better, MD, PGY1 OBGYN Faculty Teaching Service  07/14/2019, 9:09 PM

## 2019-07-14 NOTE — Progress Notes (Signed)
Interpreter used for admission: [phone]

## 2019-07-14 NOTE — Progress Notes (Addendum)
LABOR PROGRESS NOTE  Tiffany [name] is a 38 y.o. [id number] at [redacted]w[redacted]d  admitted for IOL for [place].  Subjective: Patient is doing well.  Resting comfortably.  Objective: BP 111/79   Pulse 96   Temp 98.1 F (36.7 C) (Oral)   Resp 17   Ht 4\' 9"  (1.448 m)   Wt 48.4 kg   LMP 11/08/2018 (Exact Date)   BMI 23.11 kg/m  or  Vitals:   07/14/19 0856 07/14/19 1017 07/14/19 1203 07/14/19 1349  BP:  108/76 112/72 111/79  Pulse:  94 96 96  Resp:  17 17 17   Temp:    98.1 F (36.7 C)  TempSrc:    Oral  Weight: 48.4 kg     Height: 4\' 9"  (1.448 m)        Dilation: 1 Effacement (%): 40, 50 Station: -3 Presentation: Vertex Exam by:: Dr. Caron Presume FHT: baseline rate 150, moderate varibility, + acel, no decel Toco: Every 2 minutes  Labs: Lab Results  Component Value Date   WBC 9.9 07/14/2019   HGB 11.6 (L) 07/14/2019   HCT 34.5 (L) 07/14/2019   MCV 92.2 07/14/2019   PLT 263 07/14/2019    Patient Active Problem List   Diagnosis Date Noted  . Gestational diabetes mellitus (GDM), antepartum 07/14/2019  . [redacted] weeks gestation of pregnancy 07/14/2019  . Proteinuria in pregnancy, antepartum 03/31/2019  . Supervision of high risk pregnancy, antepartum 03/26/2019  . AMA (advanced maternal age) multigravida 35+ 03/26/2019  . Elevated AFP 03/26/2019  . Anemia in pregnancy 03/26/2019  . GDM (gestational diabetes mellitus) 03/26/2019  . Language barrier 03/26/2019  . COVID-19 virus infection 01/15/2019  . Uterine mass 05/15/2014    Assessment / Plan: 38 y.o. [name] at [redacted]w[redacted]d here for IOL for GDM A2  Labor: s/p Cytotec x1.  Patient is contracting frequently.  Cervical exam is changing.  Foley bulb placed on this exam.  Will monitor for Foley bulb to fall out. Fetal Wellbeing: Category 1, reassuring Pain Control: None at this time, epidural if requested, IV pain medicine if requested Anticipated MOD: Vaginal delivery  GDM A2: -Patient had low blood sugar on arrival of 63.  Was given juice and  came up to 98. -Continue every 4 hour blood glucose while in latent labor and increase to every 2 hour glucose when in active labor  Gifford Shave, MD  PGY-1, [facility]  07/14/2019, 1:55 PM

## 2019-07-14 NOTE — Progress Notes (Signed)
LABOR PROGRESS NOTE  Tiffany [name] is a 38 y.o. G3O7564 at [redacted]w[redacted]d  admitted for IOL for [place].  Subjective: Patient is doing well at this time.  Went to room to assess Foley bulb.  Objective: BP (!) 94/56   Pulse 91   Temp 98.1 F (36.7 C) (Oral)   Resp 17   Ht 4\' 9"  (1.448 m)   Wt 48.4 kg   LMP 11/08/2018 (Exact Date)   BMI 23.11 kg/m  or  Vitals:   07/14/19 1349 07/14/19 1605 07/14/19 1749 07/14/19 1904  BP: 111/79 (!) 94/51 (!) 90/50 (!) 94/56  Pulse: 96 91 95 91  Resp: 17 18 18 17   Temp: 98.1 F (36.7 C)  98.1 F (36.7 C)   TempSrc: Oral  Oral   Weight:      Height:         Dilation: 4 Effacement (%): 50 Station: -2 Presentation: Vertex Exam by:: Dr. Caron Presume FHT: baseline rate 140, moderate varibility, no acel, no decel Toco: Every 1-2 minutes  Labs: Lab Results  Component Value Date   WBC 9.9 07/14/2019   HGB 11.6 (L) 07/14/2019   HCT 34.5 (L) 07/14/2019   MCV 92.2 07/14/2019   PLT 263 07/14/2019    Patient Active Problem List   Diagnosis Date Noted  . Gestational diabetes mellitus (GDM), antepartum 07/14/2019  . [redacted] weeks gestation of pregnancy 07/14/2019  . Proteinuria in pregnancy, antepartum 03/31/2019  . Supervision of high risk pregnancy, antepartum 03/26/2019  . AMA (advanced maternal age) multigravida 35+ 03/26/2019  . Elevated AFP 03/26/2019  . Anemia in pregnancy 03/26/2019  . GDM (gestational diabetes mellitus) 03/26/2019  . Language barrier 03/26/2019  . COVID-19 virus infection 01/15/2019  . Uterine mass 05/15/2014    Assessment / Plan: 38 y.o. [name] at [redacted]w[redacted]d here for IOL for GDM A2  Labor: s/p Cytotec x1.  Foley bulb is out at this time.  Patient is contracting frequently.  Will discuss other tach versus Pitocin with 19 Fetal Wellbeing: Category 1, reassuring Pain Control: IV pain medicine at this time epidural if requested Anticipated MOD: Vaginal delivery  GDM A2: -Most recent blood glucose 97 -Patient had low blood sugar on  arrival of 63.  Was given juice and came up to 98. -Continue every 4 hour blood glucose while in latent labor and increase to every 2 hour glucose when in active labor  Gifford Shave, MD  PGY-1, [facility]  07/14/2019, 7:51 PM

## 2019-07-14 NOTE — Anesthesia Procedure Notes (Signed)
Epidural Patient location during procedure: OB Start time: 07/14/2019 9:33 PM End time: 07/14/2019 9:41 PM  Staffing Anesthesiologist: Josephine Igo, MD Performed: anesthesiologist   Preanesthetic Checklist Completed: patient identified, site marked, surgical consent, pre-op evaluation, timeout performed, IV checked, risks and benefits discussed and monitors and equipment checked  Epidural Patient position: sitting Prep: site prepped and draped and DuraPrep Patient monitoring: continuous pulse ox and blood pressure Approach: midline Location: L3-L4 Injection technique: LOR air  Needle:  Needle type: Tuohy  Needle gauge: 17 G Needle length: 9 cm and 9 Needle insertion depth: 4 cm Catheter type: closed end flexible Catheter size: 19 Gauge Catheter at skin depth: 9 cm Test dose: negative  Assessment Events: blood not aspirated, injection not painful, no injection resistance, negative IV test and no paresthesia  Additional Notes Patient identified. Risks and benefits discussed including failed block, incomplete  Pain control, post dural puncture headache, nerve damage, paralysis, blood pressure Changes, nausea, vomiting, reactions to medications-both toxic and allergic and post Partum back pain. All questions were answered. Patient expressed understanding and wished to proceed. Sterile technique was used throughout procedure. Epidural site was Dressed with sterile barrier dressing. No paresthesias, signs of intravascular injection Or signs of intrathecal spread were encountered.  Patient was more comfortable after the epidural was dosed. Please see RN's note for documentation of vital signs and FHR which are stable. Reason for block:procedure for pain

## 2019-07-15 ENCOUNTER — Encounter (HOSPITAL_COMMUNITY): Payer: Self-pay

## 2019-07-15 ENCOUNTER — Encounter (HOSPITAL_COMMUNITY)
Admission: AD | Disposition: A | Discharge status: Home / Self Care | Payer: Self-pay | Source: Home / Self Care | Attending: Obstetrics and Gynecology

## 2019-07-15 DIAGNOSIS — O411230 Chorioamnionitis, third trimester, not applicable or unspecified: Secondary | ICD-10-CM

## 2019-07-15 DIAGNOSIS — Z90710 Acquired absence of both cervix and uterus: Secondary | ICD-10-CM | POA: Diagnosis not present

## 2019-07-15 DIAGNOSIS — Z3A39 39 weeks gestation of pregnancy: Secondary | ICD-10-CM

## 2019-07-15 DIAGNOSIS — D65 Disseminated intravascular coagulation [defibrination syndrome]: Secondary | ICD-10-CM | POA: Diagnosis not present

## 2019-07-15 DIAGNOSIS — O411290 Chorioamnionitis, unspecified trimester, not applicable or unspecified: Secondary | ICD-10-CM | POA: Diagnosis not present

## 2019-07-15 DIAGNOSIS — O4690 Antepartum hemorrhage, unspecified, unspecified trimester: Secondary | ICD-10-CM | POA: Diagnosis not present

## 2019-07-15 HISTORY — PX: ABDOMINAL HYSTERECTOMY: SHX81

## 2019-07-15 LAB — COMPREHENSIVE METABOLIC PANEL
ALT: 10 U/L (ref 0–44)
AST: 22 U/L (ref 15–41)
Albumin: 1.6 g/dL — ABNORMAL LOW (ref 3.5–5.0)
Alkaline Phosphatase: 54 U/L (ref 38–126)
Anion gap: 20 — ABNORMAL HIGH (ref 5–15)
BUN: 10 mg/dL (ref 6–20)
CO2: 11 mmol/L — ABNORMAL LOW (ref 22–32)
Calcium: 6 mg/dL — CL (ref 8.9–10.3)
Chloride: 107 mmol/L (ref 98–111)
Creatinine, Ser: 1.1 mg/dL — ABNORMAL HIGH (ref 0.44–1.00)
GFR calc Af Amer: >60 mL/min (ref >60)
GFR calc non Af Amer: >60 mL/min (ref >60)
Glucose, Bld: 172 mg/dL — ABNORMAL HIGH (ref 70–99)
Potassium: 4.9 mmol/L (ref 3.5–5.1)
Sodium: 138 mmol/L (ref 135–145)
Total Bilirubin: 1 mg/dL (ref 0.3–1.2)
Total Protein: <3 g/dL — ABNORMAL LOW (ref 6.5–8.1)

## 2019-07-15 LAB — CBC
HCT: 24.8 % — ABNORMAL LOW (ref 36.0–46.0)
Hemoglobin: 8.4 g/dL — ABNORMAL LOW (ref 12.0–15.0)
MCH: 30 pg (ref 26.0–34.0)
MCHC: 33.9 g/dL (ref 30.0–36.0)
MCV: 88.6 fL (ref 80.0–100.0)
Platelets: 61 10*3/uL — ABNORMAL LOW (ref 150–400)
RBC: 2.8 MIL/uL — ABNORMAL LOW (ref 3.87–5.11)
RDW: 13.9 % (ref 11.5–15.5)
WBC: 6.1 10*3/uL (ref 4.0–10.5)
nRBC: 0 % (ref 0.0–0.2)

## 2019-07-15 LAB — POCT I-STAT 7, (LYTES, BLD GAS, ICA,H+H)
Acid-base deficit: 15 mmol/L — ABNORMAL HIGH (ref 0.0–2.0)
Acid-base deficit: 12 mmol/L — ABNORMAL HIGH (ref 0.0–2.0)
Bicarbonate: 9.6 mmol/L — ABNORMAL LOW (ref 20.0–28.0)
Bicarbonate: 12 mmol/L — ABNORMAL LOW (ref 20.0–28.0)
Calcium, Ion: 0.62 mmol/L — CL (ref 1.15–1.40)
Calcium, Ion: 1.06 mmol/L — ABNORMAL LOW (ref 1.15–1.40)
HCT: 20 % — ABNORMAL LOW (ref 36.0–46.0)
HCT: 18 % — ABNORMAL LOW (ref 36.0–46.0)
Hemoglobin: 6.8 g/dL — CL (ref 12.0–15.0)
Hemoglobin: 6.1 g/dL — CL (ref 12.0–15.0)
O2 Saturation: 98 %
O2 Saturation: 97 %
Patient temperature: 100.7
Patient temperature: 100.8
Potassium: 4.9 mmol/L (ref 3.5–5.1)
Potassium: 4.5 mmol/L (ref 3.5–5.1)
Sodium: 139 mmol/L (ref 135–145)
Sodium: 138 mmol/L (ref 135–145)
TCO2: 10 mmol/L — ABNORMAL LOW (ref 22–32)
TCO2: 13 mmol/L — ABNORMAL LOW (ref 22–32)
pCO2 arterial: 20.1 mmHg — ABNORMAL LOW (ref 32.0–48.0)
pCO2 arterial: 20.6 mmHg — ABNORMAL LOW (ref 32.0–48.0)
pH, Arterial: 7.294 — ABNORMAL LOW (ref 7.350–7.450)
pH, Arterial: 7.377 (ref 7.350–7.450)
pO2, Arterial: 120 mmHg — ABNORMAL HIGH (ref 83.0–108.0)
pO2, Arterial: 95 mmHg (ref 83.0–108.0)

## 2019-07-15 LAB — GLUCOSE, CAPILLARY
Glucose-Capillary: 81 mg/dL (ref 70–99)
Glucose-Capillary: 104 mg/dL — ABNORMAL HIGH (ref 70–99)
Glucose-Capillary: 69 mg/dL — ABNORMAL LOW (ref 70–99)
Glucose-Capillary: 70 mg/dL (ref 70–99)
Glucose-Capillary: 87 mg/dL (ref 70–99)
Glucose-Capillary: 111 mg/dL — ABNORMAL HIGH (ref 70–99)

## 2019-07-15 LAB — PREPARE RBC (CROSSMATCH)

## 2019-07-15 LAB — PROTIME-INR
INR: 2.2 — ABNORMAL HIGH (ref 0.8–1.2)
Prothrombin Time: 24.2 seconds — ABNORMAL HIGH (ref 11.4–15.2)

## 2019-07-15 LAB — FIBRINOGEN: Fibrinogen: 64 mg/dL — CL (ref 210–475)

## 2019-07-15 LAB — APTT: aPTT: 71 seconds — ABNORMAL HIGH (ref 24–36)

## 2019-07-15 SURGERY — Surgical Case
Anesthesia: Epidural | Site: Uterus

## 2019-07-15 MED ORDER — ONDANSETRON HCL 4 MG/2ML IJ SOLN
INTRAMUSCULAR | Status: DC | PRN
  Administered 2019-07-15: 4 mg via INTRAVENOUS

## 2019-07-15 MED ORDER — DEXAMETHASONE SODIUM PHOSPHATE 10 MG/ML IJ SOLN
INTRAMUSCULAR | Status: DC | PRN
  Administered 2019-07-15: 4 mg via INTRAVENOUS

## 2019-07-15 MED ORDER — CLINDAMYCIN PHOSPHATE 900 MG/50ML IV SOLN
INTRAVENOUS | Status: AC
  Filled 2019-07-15: qty 50

## 2019-07-15 MED ORDER — FUROSEMIDE 10 MG/ML IJ SOLN
INTRAMUSCULAR | Status: AC
  Filled 2019-07-15: qty 2

## 2019-07-15 MED ORDER — COCONUT OIL OIL: 1.0000 "application " | TOPICAL_OIL | Status: DC | PRN

## 2019-07-15 MED ORDER — SODIUM CHLORIDE 0.9 % IV SOLN
INTRAVENOUS | Status: AC
  Filled 2019-07-15: qty 2000

## 2019-07-15 MED ORDER — IBUPROFEN 200 MG PO TABS: 800.0000 mg | ORAL_TABLET | Freq: Three times a day (TID) | ORAL | Status: DC

## 2019-07-15 MED ORDER — CLINDAMYCIN PHOSPHATE 900 MG/50ML IV SOLN
900.0000 mg | Freq: Once | INTRAVENOUS | Status: AC
  Administered 2019-07-15: 19:00:00 900 mg via INTRAVENOUS

## 2019-07-15 MED ORDER — CEFAZOLIN SODIUM-DEXTROSE 2-4 GM/100ML-% IV SOLN
INTRAVENOUS | Status: AC
  Filled 2019-07-15: qty 100

## 2019-07-15 MED ORDER — SODIUM CHLORIDE 0.9% IV SOLUTION
Freq: Once | INTRAVENOUS | Status: AC
  Administered 2019-07-16: via INTRAVENOUS

## 2019-07-15 MED ORDER — FENTANYL CITRATE (PF) 100 MCG/2ML IJ SOLN
INTRAMUSCULAR | Status: DC | PRN
  Administered 2019-07-15 (×2): 50 ug via INTRAVENOUS

## 2019-07-15 MED ORDER — DIBUCAINE (PERIANAL) 1 % EX OINT: 1.0000 "application " | TOPICAL_OINTMENT | CUTANEOUS | Status: DC | PRN

## 2019-07-15 MED ORDER — TRANEXAMIC ACID 1000 MG/10ML IV SOLN
INTRAVENOUS | Status: DC | PRN
  Administered 2019-07-15: 1000 mg via INTRAVENOUS

## 2019-07-15 MED ORDER — CHLORHEXIDINE GLUCONATE CLOTH 2 % EX PADS
6.0000 | MEDICATED_PAD | Freq: Every day | CUTANEOUS | Status: DC
  Administered 2019-07-15: 23:00:00 6 via TOPICAL

## 2019-07-15 MED ORDER — TRANEXAMIC ACID-NACL 1000-0.7 MG/100ML-% IV SOLN
1000.0000 mg | Freq: Once | INTRAVENOUS | Status: AC
  Administered 2019-07-15: 19:00:00 1000 mg via INTRAVENOUS
  Filled 2019-07-15: qty 100

## 2019-07-15 MED ORDER — KETOROLAC TROMETHAMINE 30 MG/ML IJ SOLN: 30.0000 mg | Freq: Four times a day (QID) | INTRAMUSCULAR | Status: DC

## 2019-07-15 MED ORDER — CARBOPROST TROMETHAMINE 250 MCG/ML IM SOLN
INTRAMUSCULAR | Status: AC
  Filled 2019-07-15: qty 1

## 2019-07-15 MED ORDER — SIMETHICONE 80 MG PO CHEW: 80.0000 mg | CHEWABLE_TABLET | ORAL | Status: DC | PRN

## 2019-07-15 MED ORDER — CALCIUM CHLORIDE 10 % IV SOLN
INTRAVENOUS | Status: DC | PRN
  Administered 2019-07-15: 1 g via INTRAVENOUS

## 2019-07-15 MED ORDER — GENTAMICIN SULFATE 40 MG/ML IJ SOLN
5.0000 mg/kg | INTRAVENOUS | Status: DC
  Administered 2019-07-15: 240 mg via INTRAVENOUS
  Filled 2019-07-15 (×2): qty 6

## 2019-07-15 MED ORDER — MORPHINE SULFATE (PF) 0.5 MG/ML IJ SOLN
INTRAMUSCULAR | Status: AC
  Filled 2019-07-15: qty 10

## 2019-07-15 MED ORDER — OXYTOCIN 40 UNITS IN NORMAL SALINE INFUSION - SIMPLE MED
INTRAVENOUS | Status: DC | PRN
  Administered 2019-07-15: 40 [IU] via INTRAVENOUS

## 2019-07-15 MED ORDER — SIMETHICONE 80 MG PO CHEW: 80.0000 mg | CHEWABLE_TABLET | ORAL | Status: DC

## 2019-07-15 MED ORDER — ENOXAPARIN SODIUM 40 MG/0.4ML ~~LOC~~ SOLN: 40.0000 mg | SUBCUTANEOUS | Status: DC

## 2019-07-15 MED ORDER — DIPHENHYDRAMINE HCL 25 MG PO CAPS: 25.0000 mg | ORAL_CAPSULE | Freq: Four times a day (QID) | ORAL | Status: DC | PRN

## 2019-07-15 MED ORDER — MISOPROSTOL 25 MCG QUARTER TABLET
ORAL_TABLET | ORAL | Status: DC | PRN
  Administered 2019-07-15: 1000 ug via RECTAL

## 2019-07-15 MED ORDER — SODIUM BICARBONATE 8.4 % IV SOLN
INTRAVENOUS | Status: DC | PRN
  Administered 2019-07-15: 5 mL via EPIDURAL
  Administered 2019-07-15 (×3): 2 mL via EPIDURAL
  Administered 2019-07-15: 3 mL via EPIDURAL
  Administered 2019-07-15 (×2): 2 mL via EPIDURAL

## 2019-07-15 MED ORDER — ONDANSETRON HCL 4 MG/2ML IJ SOLN
INTRAMUSCULAR | Status: AC
  Filled 2019-07-15: qty 2

## 2019-07-15 MED ORDER — CALCIUM GLUCONATE-NACL 1-0.675 GM/50ML-% IV SOLN
INTRAVENOUS | Status: AC
  Filled 2019-07-15: qty 50

## 2019-07-15 MED ORDER — SODIUM CHLORIDE 0.9 % IV SOLN
INTRAVENOUS | Status: DC | PRN
  Administered 2019-07-15 (×2): via INTRAVENOUS

## 2019-07-15 MED ORDER — LACTATED RINGERS AMNIOINFUSION: INTRAVENOUS | Status: DC

## 2019-07-15 MED ORDER — CEFAZOLIN SODIUM-DEXTROSE 2-4 GM/100ML-% IV SOLN
2.0000 g | Freq: Once | INTRAVENOUS | Status: AC
  Administered 2019-07-15: 2 g via INTRAVENOUS
  Filled 2019-07-15: qty 100

## 2019-07-15 MED ORDER — CARBOPROST TROMETHAMINE 250 MCG/ML IM SOLN
INTRAMUSCULAR | Status: DC | PRN
  Administered 2019-07-15: 250 ug via INTRAMUSCULAR

## 2019-07-15 MED ORDER — MISOPROSTOL 200 MCG PO TABS
ORAL_TABLET | ORAL | Status: AC
  Filled 2019-07-15: qty 5

## 2019-07-15 MED ORDER — CALCIUM GLUCONATE 10 % IV SOLN
INTRAVENOUS | Status: DC | PRN
  Administered 2019-07-15 (×2): 1 g via INTRAVENOUS

## 2019-07-15 MED ORDER — FENTANYL CITRATE (PF) 100 MCG/2ML IJ SOLN
INTRAMUSCULAR | Status: AC
  Filled 2019-07-15: qty 2

## 2019-07-15 MED ORDER — METHYLERGONOVINE MALEATE 0.2 MG/ML IJ SOLN: 0.2000 mg | INTRAMUSCULAR | Status: DC | PRN

## 2019-07-15 MED ORDER — PHENYLEPHRINE 40 MCG/ML (10ML) SYRINGE FOR IV PUSH (FOR BLOOD PRESSURE SUPPORT)
PREFILLED_SYRINGE | INTRAVENOUS | Status: AC
  Filled 2019-07-15: qty 30

## 2019-07-15 MED ORDER — SODIUM CHLORIDE 0.9 % IV SOLN
2.0000 g | Freq: Four times a day (QID) | INTRAVENOUS | Status: DC
  Administered 2019-07-16 (×2): 2 g via INTRAVENOUS
  Filled 2019-07-15: qty 2000
  Filled 2019-07-15 (×2): qty 2
  Filled 2019-07-15: qty 2000

## 2019-07-15 MED ORDER — SODIUM CHLORIDE 0.9 % IV SOLN
INTRAVENOUS | Status: AC
  Filled 2019-07-15: qty 500

## 2019-07-15 MED ORDER — DEXAMETHASONE SODIUM PHOSPHATE 4 MG/ML IJ SOLN
INTRAMUSCULAR | Status: AC
  Filled 2019-07-15: qty 1

## 2019-07-15 MED ORDER — MORPHINE SULFATE (PF) 0.5 MG/ML IJ SOLN
INTRAMUSCULAR | Status: DC | PRN
  Administered 2019-07-15: 3 mg via EPIDURAL

## 2019-07-15 MED ORDER — SIMETHICONE 80 MG PO CHEW
80.0000 mg | CHEWABLE_TABLET | Freq: Three times a day (TID) | ORAL | Status: DC
  Administered 2019-07-16 – 2019-07-19 (×11): 80 mg via ORAL
  Filled 2019-07-15 (×12): qty 1

## 2019-07-15 MED ORDER — CARBOPROST TROMETHAMINE 250 MCG/ML IM SOLN
INTRAMUSCULAR | Status: DC | PRN
  Administered 2019-07-15 (×2): 250 ug via INTRAMUSCULAR

## 2019-07-15 MED ORDER — SODIUM CHLORIDE 0.9 % IV SOLN
500.0000 mg | Freq: Once | INTRAVENOUS | Status: DC
  Filled 2019-07-15: qty 500

## 2019-07-15 MED ORDER — SODIUM CHLORIDE 0.9 % IR SOLN
Status: DC | PRN
  Administered 2019-07-15: 1

## 2019-07-15 MED ORDER — LIDOCAINE-EPINEPHRINE (PF) 2 %-1:200000 IJ SOLN
INTRAMUSCULAR | Status: DC | PRN
  Administered 2019-07-15: 2 mL via INTRADERMAL

## 2019-07-15 MED ORDER — ALBUMIN HUMAN 5 % IV SOLN
INTRAVENOUS | Status: DC | PRN
  Administered 2019-07-15 (×2): via INTRAVENOUS

## 2019-07-15 MED ORDER — CALCIUM GLUCONATE-NACL 1-0.675 GM/50ML-% IV SOLN
1.0000 g | Freq: Once | INTRAVENOUS | Status: AC
  Administered 2019-07-16: 1000 mg via INTRAVENOUS
  Filled 2019-07-15: qty 50

## 2019-07-15 MED ORDER — HYDROCODONE-ACETAMINOPHEN 5-325 MG PO TABS: 1.0000 | ORAL_TABLET | ORAL | Status: DC | PRN

## 2019-07-15 MED ORDER — METHYLERGONOVINE MALEATE 0.2 MG/ML IJ SOLN
INTRAMUSCULAR | Status: DC | PRN
  Administered 2019-07-15: 0.2 mg via INTRAMUSCULAR

## 2019-07-15 MED ORDER — LACTATED RINGERS IV SOLN
INTRAVENOUS | Status: DC
  Administered 2019-07-15: via INTRAVENOUS

## 2019-07-15 MED ORDER — OXYTOCIN 40 UNITS IN NORMAL SALINE INFUSION - SIMPLE MED: 2.5000 [IU]/h | INTRAVENOUS | Status: DC

## 2019-07-15 MED ORDER — DIPHENHYDRAMINE HCL 50 MG/ML IJ SOLN
25.0000 mg | Freq: Once | INTRAMUSCULAR | Status: AC
  Administered 2019-07-15: 15:00:00 25 mg via INTRAVENOUS

## 2019-07-15 MED ORDER — CEFAZOLIN SODIUM-DEXTROSE 2-3 GM-%(50ML) IV SOLR
INTRAVENOUS | Status: DC | PRN
  Administered 2019-07-15: 2 g via INTRAVENOUS

## 2019-07-15 MED ORDER — METHYLERGONOVINE MALEATE 0.2 MG/ML IJ SOLN
INTRAMUSCULAR | Status: AC
  Filled 2019-07-15: qty 1

## 2019-07-15 MED ORDER — METHYLERGONOVINE MALEATE 0.2 MG PO TABS: 0.2000 mg | ORAL_TABLET | ORAL | Status: DC | PRN

## 2019-07-15 MED ORDER — ACETAMINOPHEN 325 MG PO TABS
650.0000 mg | ORAL_TABLET | ORAL | Status: DC | PRN
  Administered 2019-07-15: 23:00:00 650 mg via ORAL
  Filled 2019-07-15: qty 2

## 2019-07-15 MED ORDER — FUROSEMIDE 10 MG/ML IJ SOLN
INTRAMUSCULAR | Status: DC | PRN
  Administered 2019-07-15: 20 mg via INTRAVENOUS

## 2019-07-15 MED ORDER — ALBUMIN HUMAN 5 % IV SOLN
INTRAVENOUS | Status: AC
  Filled 2019-07-15: qty 250

## 2019-07-15 MED ORDER — TRANEXAMIC ACID-NACL 1000-0.7 MG/100ML-% IV SOLN
INTRAVENOUS | Status: AC
  Filled 2019-07-15: qty 100

## 2019-07-15 MED ORDER — SODIUM CHLORIDE 0.9 % IV SOLN
INTRAVENOUS | Status: DC | PRN
  Administered 2019-07-15: 20:00:00 via INTRAVENOUS

## 2019-07-15 MED ORDER — LIDOCAINE-EPINEPHRINE (PF) 2 %-1:200000 IJ SOLN: INTRAMUSCULAR | Status: DC | PRN

## 2019-07-15 MED ORDER — SODIUM CHLORIDE 0.9 % IV SOLN: 2.0000 g | Freq: Once | INTRAVENOUS | Status: DC

## 2019-07-15 MED ORDER — CLINDAMYCIN PHOSPHATE 900 MG/50ML IV SOLN
900.0000 mg | Freq: Four times a day (QID) | INTRAVENOUS | Status: DC
  Administered 2019-07-16 (×2): 900 mg via INTRAVENOUS
  Filled 2019-07-15 (×4): qty 50

## 2019-07-15 MED ORDER — SENNOSIDES-DOCUSATE SODIUM 8.6-50 MG PO TABS
2.0000 | ORAL_TABLET | Freq: Every day | ORAL | Status: DC
  Administered 2019-07-16: 11:00:00 2 via ORAL
  Filled 2019-07-15 (×2): qty 2

## 2019-07-15 MED ORDER — MENTHOL 3 MG MT LOZG: 1.0000 | LOZENGE | OROMUCOSAL | Status: DC | PRN

## 2019-07-15 MED ORDER — PRENATAL MULTIVITAMIN CH
1.0000 | ORAL_TABLET | Freq: Every day | ORAL | Status: DC
  Administered 2019-07-16: 12:00:00 1 via ORAL
  Filled 2019-07-15: qty 1

## 2019-07-15 MED ORDER — WITCH HAZEL-GLYCERIN EX PADS: 1.0000 "application " | MEDICATED_PAD | CUTANEOUS | Status: DC | PRN

## 2019-07-15 SURGICAL SUPPLY — 49 items
BENZOIN TINCTURE PRP APPL 2/3 (GAUZE/BANDAGES/DRESSINGS) ×4 IMPLANT
CHLORAPREP W/TINT 26ML (MISCELLANEOUS) ×4 IMPLANT
CLAMP CORD UMBIL (MISCELLANEOUS) IMPLANT
CLOSURE WOUND 1/2 X4 (GAUZE/BANDAGES/DRESSINGS) ×1
CLOTH BEACON ORANGE TIMEOUT ST (SAFETY) ×4 IMPLANT
DRSG OPSITE POSTOP 4X10 (GAUZE/BANDAGES/DRESSINGS) ×4 IMPLANT
ELECT REM PT RETURN 9FT ADLT (ELECTROSURGICAL) ×4
ELECTRODE REM PT RTRN 9FT ADLT (ELECTROSURGICAL) ×2 IMPLANT
EXTRACTOR VACUUM M CUP 4 TUBE (SUCTIONS) IMPLANT
EXTRACTOR VACUUM M CUP 4' TUBE (SUCTIONS)
GLOVE BIOGEL PI IND STRL 7.0 (GLOVE) ×4 IMPLANT
GLOVE BIOGEL PI IND STRL 7.5 (GLOVE) ×4 IMPLANT
GLOVE BIOGEL PI INDICATOR 7.0 (GLOVE) ×4
GLOVE BIOGEL PI INDICATOR 7.5 (GLOVE) ×4
GLOVE ECLIPSE 7.5 STRL STRAW (GLOVE) ×4 IMPLANT
GOWN STRL REUS W/TWL LRG LVL3 (GOWN DISPOSABLE) ×12 IMPLANT
HEMOSTAT ARISTA ABSORB 3G PWDR (HEMOSTASIS) ×12 IMPLANT
KIT ABG SYR 3ML LUER SLIP (SYRINGE) IMPLANT
NEEDLE HYPO 25X5/8 SAFETYGLIDE (NEEDLE) ×4 IMPLANT
NS IRRIG 1000ML POUR BTL (IV SOLUTION) ×4 IMPLANT
PACK C SECTION WH (CUSTOM PROCEDURE TRAY) ×4 IMPLANT
PAD ABD 7.5X8 STRL (GAUZE/BANDAGES/DRESSINGS) ×8 IMPLANT
PAD OB MATERNITY 4.3X12.25 (PERSONAL CARE ITEMS) ×4 IMPLANT
PENCIL SMOKE EVAC W/HOLSTER (ELECTROSURGICAL) ×4 IMPLANT
RTRCTR C-SECT PINK 25CM LRG (MISCELLANEOUS) ×4 IMPLANT
SPONGE GAUZE 4X4 12PLY STER LF (GAUZE/BANDAGES/DRESSINGS) ×8 IMPLANT
SPONGE LAP 18X18 X RAY DECT (DISPOSABLE) ×44 IMPLANT
STRIP CLOSURE SKIN 1/2X4 (GAUZE/BANDAGES/DRESSINGS) ×3 IMPLANT
SUT MNCRL 0 VIOLET CTX 36 (SUTURE) ×16 IMPLANT
SUT MON AB 2-0 CT1 27 (SUTURE) ×16 IMPLANT
SUT MONOCRYL 0 CTX 36 (SUTURE) ×16
SUT VIC AB 0 CT1 18XCR BRD8 (SUTURE) ×4 IMPLANT
SUT VIC AB 0 CT1 27 (SUTURE) ×6
SUT VIC AB 0 CT1 27XBRD ANBCTR (SUTURE) ×4 IMPLANT
SUT VIC AB 0 CT1 27XCR 8 STRN (SUTURE) ×2 IMPLANT
SUT VIC AB 0 CT1 8-18 (SUTURE) ×4
SUT VIC AB 0 CTX 36 (SUTURE) ×12
SUT VIC AB 0 CTX36XBRD ANBCTRL (SUTURE) ×12 IMPLANT
SUT VIC AB 2-0 CT1 27 (SUTURE) ×4
SUT VIC AB 2-0 CT1 TAPERPNT 27 (SUTURE) ×4 IMPLANT
SUT VIC AB 3-0 SH 27 (SUTURE) ×2
SUT VIC AB 3-0 SH 27X BRD (SUTURE) ×2 IMPLANT
SUT VIC AB 4-0 KS 27 (SUTURE) ×4 IMPLANT
SUT VICRYL 0 TIES 12 18 (SUTURE) ×4 IMPLANT
SYR 3ML 23GX1 SAFETY (SYRINGE) ×4 IMPLANT
SYR 3ML 25GX5/8 SAFETY (SYRINGE) ×4 IMPLANT
TOWEL OR 17X24 6PK STRL BLUE (TOWEL DISPOSABLE) ×4 IMPLANT
TRAY FOLEY W/BAG SLVR 14FR LF (SET/KITS/TRAYS/PACK) ×4 IMPLANT
WATER STERILE IRR 1000ML POUR (IV SOLUTION) ×8 IMPLANT

## 2019-07-15 NOTE — Discharge Summary (Signed)
Postpartum Discharge Summary     Patient Name: Tiffany [name] DOB: Apr 25, 1981 MRN: [id number]  Date of admission: 07/14/2019 Delivering Provider: Truett Mainland   Date of discharge: 07/19/2019  Admitting diagnosis: Pregnancy at 54/0. GDMa2 (metformin). AMA Secondary diagnosis: AMA. Proteinuria in pregnancy. Anemia.       Discharge diagnosis: s/p primary c-section and hysterectomy. +MRSA screen  Augmentation: Pitocin, Cytotec and Foley Balloon  Complications: Chorio and arrest of dilation necessitating c-section. Atony and DIC necessitating hysterectomy at the time of c-section.   Hospital course:     38 y.o. yo [id number] was admitted to the hospital 07/14/2019 for induction of labor. Patient had a labor course significant for: induced initially with misoprostol and then had FB placed. Subsequently augmented with pitocin and had SROM. Patient achieved 5cm dilation and remained that way for approximately 16 hours. Patient subsequently developed recurrent lates with minimal variability and was recommended for cesarean. After she had agreed she also developed a fever and was diagnosed with Chorio.   The patient went for cesarean section due to Arrest of Dilation and Non-Reassuring FHR, and delivered a Viable infant,07/15/2019 .   Details of operation can be found in separate operative note but were notable for uterine atony not responsive to uterotonics or B-Lynch suture and necessitating cesarean supracervical hysterectomy. Operation also complicated by [facility] with EBL 5211 cc and DIC, for which she had intraop transfusion of 7u pRBC, 5u FFP, 3u cryoprecipitate, 2u platelets; she didn't need general anesthesia. Final pathology did not show any abnormal placentation.   She was transferred to the ICU for close monitoring and ended receiving one more unit of PRBC. She received IV abx for 24 hours post op given chorio, c-section and massive blood loss. She was eventually transitioned to postpartum on POD#1  with improving vital signs and labwork. She had a CXR that showed ? PNA vs fluid and she diuresed with IV lasix and an echo was normal.   At the time of discharge, she was meeting all post op goals and was doing well   Magnesium Sulfate received: No BMZ received: No Rhophylac:N/A MMR:N/A  Physical exam  Patient Vitals for the past 24 hrs:  BP Temp Temp src Pulse Resp SpO2  07/19/19 0747 113/83 98 F (36.7 C) Oral 90 18 97 %  07/19/19 0423 122/85 98.4 F (36.9 C) Oral 90 18 99 %  07/18/19 2330 113/74 98.1 F (36.7 C) Oral 98 18 98 %    General: NAD Abdomen: +BS, soft, nttp, nd. Honeycomb dressing removed and in the mid line was some skin separation along 3cm of the incision. This area, as well as the rest of the incision was reinforced with steri strips with benzoin and another dressing prior to discharge. No e/o infection  Labs: CBC Latest Ref Rng & Units 07/18/2019 07/17/2019 07/16/2019  WBC 4.0 - 10.5 K/uL 13.4(H) 16.4(H) 15.9(H)  Hemoglobin 12.0 - 15.0 g/dL 9.7(L) 10.3(L) 9.8(L)  Hematocrit 36.0 - 46.0 % 28.3(L) 29.7(L) 27.3(L)  Platelets 150 - 400 K/uL 120(L) 96(L) 90(L)   CMP Latest Ref Rng & Units 07/18/2019 07/17/2019 07/16/2019  Glucose 70 - 99 mg/dL 78 76 74  BUN 6 - 20 mg/dL 6 9 10   Creatinine 0.44 - 1.00 mg/dL 0.66 0.80 0.88  Sodium 135 - 145 mmol/L 140 139 139  Potassium 3.5 - 5.1 mmol/L 4.0 3.5 3.6  Chloride 98 - 111 mmol/L 103 103 107  CO2 22 - 32 mmol/L 24 24 21(L)  Calcium 8.9 - 10.3  mg/dL 8.3(L) 8.2(L) 7.5(L)  Total Protein 6.5 - 8.1 g/dL 4.7(L) 4.8(L) 3.8(L)  Total Bilirubin 0.3 - 1.2 mg/dL 0.6 0.6 0.5  Alkaline Phos 38 - 126 U/L 72 72 48  AST 15 - 41 U/L 75(H) 119(H) 78(H)  ALT 0 - 44 U/L 33 34 23    Pending labs: BCx x 2  Discharge instruction: per After Visit Summary and "Baby and Me Booklet".  After visit meds:  Allergies as of 07/19/2019   No Known Allergies     Medication List    STOP taking these medications   Accu-Chek FastClix  Lancets Misc   Accu-Chek Guide test strip Generic drug: glucose blood   Accu-Chek Guide w/Device Kit   aspirin EC 81 MG tablet   docusate sodium 100 MG capsule Commonly known as: Colace   metFORMIN 500 MG tablet Commonly known as: Glucophage     TAKE these medications   ferrous sulfate 325 (65 FE) MG tablet Take 1 tablet (325 mg total) by mouth daily with breakfast. What changed: when to take this   oxyCODONE 5 MG immediate release tablet Commonly known as: Oxy IR/ROXICODONE Take 1-2 tablets (5-10 mg total) by mouth every 6 (six) hours as needed for severe pain.   polyethylene glycol 17 g packet Commonly known as: MIRALAX / GLYCOLAX Take 17 g by mouth daily.   PrePLUS 27-1 MG Tabs Take 1 tablet by mouth daily.   simethicone 80 MG chewable tablet Commonly known as: MYLICON Chew 1 tablet (80 mg total) by mouth 4 (four) times daily as needed for flatulence.       Diet: routine diet  Activity: Advance as tolerated. Pelvic rest for 6 weeks.   Outpatient follow up:4 weeks Follow up Appt: Future Appointments  Date Time Provider [place]  07/30/2019 10:00 AM [place]  08/14/2019  9:35 AM Rasch, Artist Pais, NP [facility]  08/27/2019  8:20 AM [facility]   Follow up Visit: Tiffany [name] for [facility]. Go in 1 week(s).   Specialty: Obstetrics and Gynecology Why: incision check Contact information: [address] [place], [place] [id number] [place] [address] [phone]           Please schedule this patient for Postpartum visit in: 4 weeks with the following provider: MD For C/S patients schedule nurse incision check in weeks 2 weeks: yes High risk pregnancy complicated by: GDMA2, cesarean/hysterectomy, OB hemorrhage, coagulopathy Delivery mode:  CS PP Procedures needed: incision check, 2hr GTT  Schedule Integrated BH visit:  yes    Newborn Data: Live born female  Birth Weight: 6 lb 7.4 oz (2931 g) APGAR: 53, 9  Newborn Delivery   Birth date/time: 07/15/2019 19:23:00 Delivery type: C-Section, Low Transverse C-section categorization: Primary      Baby Feeding: Breast Disposition:home with mother   07/19/2019 Tiffany Halim, MD

## 2019-07-15 NOTE — Progress Notes (Signed)
Labor Progress Note Tiffany [name] is a 38 y.o. L2G4010 at [redacted]w[redacted]d presented for IOL for GDM  S:  Comfortable with epidural but starting to feel pain on left side.  O:  BP 99/69   Pulse (!) 118   Temp 98.4 F (36.9 C) (Oral)   Resp 16   Ht 4\' 9"  (1.448 m)   Wt 48.4 kg   LMP 11/08/2018 (Exact Date)   SpO2 100%   BMI 23.11 kg/m  EFM: baseline 155 bpm/ mod variability/ + accels/ variable, late decels  Toco: 1-2 SVE: Dilation: 5 Effacement (%): 70 Cervical Position: Middle Station: 0 Presentation: Vertex Exam by:: Jasiyah Paulding, CNM ROT Pitocin: 16 [place]/min MVU: 220  A/P: 38 y.o. U7O5366 [redacted]w[redacted]d  1. Labor: active 2. FWB: Cat II 3. Pain: epidural  Some swelling of cervix, will try Benadryl and repositioning. Amnioinfusion for variables. Anticipate labor progression and SVD.  Julianne Handler, CNM 2:42 PM

## 2019-07-15 NOTE — Consult Note (Signed)
NAMEShanell [name], MRN:  [id number], DOB:  12/22/1980, LOS: 1 ADMISSION DATE:  07/14/2019, CONSULTATION DATE:  11/10 REFERRING MD:  Dr Nehemiah Settle OB, CHIEF COMPLAINT:  Intraoperative hemorrhage  Brief History   38 year old female admitted 11/9 for induction of labor. Ultimately required cesarean section, which was complicated by hemorrhage requiring PRBC (7 units), FFP (5 units), Platelets (3 units), and cryo (3 units).   History of present illness   38 year old female with PMH as below, which is significant for H1T0569 with intrauterine pregnancy at 39 weeks at the time of presentation on 11/9. Pregnancy complicated by GDMA2 and COVID pneumonia.  Admitted 11/9 for induction of labor. In the morning hours of 11/10 her labor was failing to progress despite pitocin and fetal monitoring was picking up some decelerations. For these reasons she was taken for cesarean section, which was complicated by uterine oozing, which was unable to be stopped and the decision was made to proceed with hysterectomy after EBL of 3L. She received a total of 7 units PRBC, 5 FFP, 3 Platelets, and 3 units of cryo. She was never hypotensive and surgery was done under epidural alone. Post-operatively she was transferred to medical ICU for recovery. PCCM consulted.   Past Medical History   has a past medical history of Appendicitis, COVID-19 (01/2019), and Medical history non-contributory.   Significant Hospital Events   11/9 admit 11/10 cesarean section complicated by hemorrhage and ultimately required hysterectomy and mass transfusion  Consults:  PCCM OB Neonatology  Procedures:  11/10 > Cesarean section, emergent hysterectomy  Significant Diagnostic Tests:    Micro Data:  COVID 11/9 > negative Blood Cx 11/10 >   Antimicrobials:  Ancef 11/10 > Clindamycin 11/10 > Gentamycin 11/10 >  Interim history/subjective:  Feeling feverish  Objective   Blood pressure (!) 104/41, pulse (!) 149, temperature (!) 103.1 F  (39.5 C), temperature source Oral, resp. rate (!) 27, height _0  (1.448 m), weight 48.4 kg, last menstrual period 11/08/2018, SpO2 96 %.        Intake/Output Summary (Last 24 hours) at 07/15/2019 2304 Last data filed at 07/15/2019 2303 Gross per 24 hour  Intake 9350 ml  Output 6646 ml  Net 2704 ml   Filed Weights   07/14/19 0856  Weight: 48.4 kg    Examination: General: adult female of normal body habitus in NAD HENT: [place]/AT, PERRL, no appreciable JVD Lungs: Clear bilateral breath sounds Cardiovascular: Tachycardic, Regular, no MRG Abdomen: Soft, non-tender, abdominal incision slightly oozing through dressing.  Extremities: No acute deformity or ROM limitation Neuro: Alert, oriented, non-focal. Drowsy but easily arousable.  GU: Foley  Resolved Hospital Problem list     Assessment & Plan:   Intraoperative hemorrhage: during cesarean section delivery. Emergent hysterectomy done during the case. EBL 3L. 7 units PRBC, 5 FFP, 3 Platelets, and 3 units of cryo given intraoperatively. Hemoglobin 6.4 with continued surgical site oozing upon arrival to ICU DIC: seems like it had started prior to surgery as urine had become bloody. 3 units cryo given.  - Give additional 2 units PRBC, 1 unit plt, and 1 unit FFP - Give calcium for Ca++ 6.0 - post transfusion CBC, INR, fibrinogen - Close hemodynamic monitoring in the ICU  Fever: concern for chorioamnionitis - Continue ABX per OB (ancef, gentamycin, clindamycin) - Culture blood  Gestational diabetes - CBG monitoring and SSI  Anion gap acidosis: - check lactic acid - Check urine for glucose/ketones - suspect this will improve as  she becomes more stable - Repeat BMP in AM  Best practice:  Diet: Clears Pain/Anxiety/Delirium protocol (if indicated): epidural VAP protocol (if indicated): na DVT prophylaxis: na GI prophylaxis: na Glucose control: CBG/SSI Mobility: BR Code Status: FULL Family Communication: Patient updated via  tele-interpreter Disposition: ICU  Labs   CBC: Recent Labs  Lab 07/14/19 0844 07/15/19 2145 07/15/19 2150 07/15/19 2222  WBC 9.9  --  6.1  --   HGB 11.6* 6.8* 8.4* 6.1*  HCT 34.5* 20.0* 24.8* 18.0*  MCV 92.2  --  88.6  --   PLT 263  --  61*  --     Basic Metabolic Panel: Recent Labs  Lab 07/15/19 2145 07/15/19 2150 07/15/19 2222  NA 139 138 138  K 4.9 4.9 4.5  CL  --  107  --   CO2  --  11*  --   GLUCOSE  --  172*  --   BUN  --  10  --   CREATININE  --  1.10*  --   CALCIUM  --  6.0*  --    GFR: Estimated Creatinine Clearance: 46.1 mL/min (A) (by C-G formula based on SCr of 1.1 mg/dL (H)). Recent Labs  Lab 07/14/19 0844 07/15/19 2150  WBC 9.9 6.1    Liver Function Tests: Recent Labs  Lab 07/15/19 2150  AST 22  ALT 10  ALKPHOS 54  BILITOT 1.0  PROT <3.0*  ALBUMIN 1.6*   No results for input(s): LIPASE, AMYLASE in the last 168 hours. No results for input(s): AMMONIA in the last 168 hours.  ABG    Component Value Date/Time   PHART 7.377 07/15/2019 2222   PCO2ART 20.6 (L) 07/15/2019 2222   PO2ART 95.0 07/15/2019 2222   HCO3 12.0 (L) 07/15/2019 2222   TCO2 13 (L) 07/15/2019 2222   ACIDBASEDEF 12.0 (H) 07/15/2019 2222   O2SAT 97.0 07/15/2019 2222     Coagulation Profile: Recent Labs  Lab 07/15/19 2020  INR 2.2*    Cardiac Enzymes: No results for input(s): CKTOTAL, CKMB, CKMBINDEX, TROPONINI in the last 168 hours.  HbA1C: Hgb A1c MFr Bld  Date/Time Value Ref Range Status  03/26/2019 09:46 AM 5.5 4.8 - 5.6 % Final    Comment:             Prediabetes: 5.7 - 6.4          Diabetes: >6.4          Glycemic control for adults with diabetes: <7.0     CBG: Recent Labs  Lab 07/15/19 0633 07/15/19 0705 07/15/19 1010 07/15/19 1355 07/15/19 1845  GLUCAP 69* 104* 70 87 111*    Review of Systems:   Bolds are positive  Constitutional: weight loss, gain, night sweats, Fevers, chills, fatigue .  HEENT: headaches, Sore throat, sneezing,  nasal congestion, post nasal drip, Difficulty swallowing, Tooth/dental problems, visual complaints visual changes, ear ache CV:  chest pain, radiates:,Orthopnea, PND, swelling in lower extremities, dizziness, palpitations, syncope.  GI  heartburn, indigestion, abdominal pain, nausea, vomiting, diarrhea, change in bowel habits, loss of appetite, bloody stools.  Resp: cough, productive:, hemoptysis, dyspnea, chest pain, pleuritic.  Skin: rash or itching or icterus GU: dysuria, change in color of urine, urgency or frequency. flank pain, hematuria  MS: joint pain or swelling. decreased range of motion  Psych: change in mood or affect. depression or anxiety.  Neuro: difficulty with speech, weakness, numbness, ataxia    Past Medical History  She,  has a past medical history  of Appendicitis, COVID-19 (01/2019), and Medical history non-contributory.   Surgical History    Past Surgical History:  Procedure Laterality Date  . LAPAROSCOPIC APPENDECTOMY N/A 04/20/2014   Procedure: APPENDECTOMY LAPAROSCOPIC;  Surgeon: Leighton Ruff, MD;  Location: [facility];  Service: General;  Laterality: N/A;     Social History   reports that she has never smoked. She has never used smokeless tobacco. She reports that she does not drink alcohol or use drugs.   Family History   Her family history includes Cancer in her mother; Heart disease in her father.   Allergies No Known Allergies   Home Medications  Prior to Admission medications   Medication Sig Start Date End Date Taking? Authorizing Provider  Accu-Chek FastClix Lancets MISC 1 each by Percutaneous route 4 (four) times daily. 03/26/19   Aletha Halim, MD  aspirin EC 81 MG tablet Take 1 tablet (81 mg total) by mouth daily. 03/26/19   Aletha Halim, MD  Blood Glucose Monitoring Suppl (ACCU-CHEK GUIDE) w/Device KIT 1 Device by Does not apply route 4 (four) times daily. 03/26/19   Aletha Halim, MD  docusate sodium (COLACE) 100 MG capsule Take 1 capsule  (100 mg total) by mouth 2 (two) times daily. 05/29/19   Emily Filbert, MD  ferrous sulfate 325 (65 FE) MG tablet Take 1 tablet (325 mg total) by mouth 3 (three) times daily with meals. 04/21/19   Donnamae Jude, MD  glucose blood (ACCU-CHEK GUIDE) test strip Use as instructed 03/26/19   Aletha Halim, MD  metFORMIN (GLUCOPHAGE) 500 MG tablet ONE TAB PO WITH BREAKFAST AND BEFORE BED 07/03/19   Woodroe Mode, MD  Prenatal Vit-Fe Fumarate-FA (PREPLUS) 27-1 MG TABS Take 1 tablet by mouth daily. 03/26/19   Aletha Halim, MD     Critical care time: 6 mins     Georgann Housekeeper, AGACNP-BC [place] Pager [phone] or [phone]  07/16/2019 12:12 AM

## 2019-07-15 NOTE — Progress Notes (Signed)
Dr. Nehemiah Settle at bedside, discussing with pt via interpretor services need for c-section due to lack of cervical change and non-reassuring fetal heart rate. Risks and benefits explained via interpretor, pt verbalized understanding and consents to c-section.

## 2019-07-15 NOTE — Op Note (Signed)
Tiffany [name] PROCEDURE DATE: 07/15/2019  PREOPERATIVE DIAGNOSIS: Intrauterine pregnancy at  [redacted]w[redacted]d weeks gestation; chorioamnionitis, failed induction, failure to progress: arrest of dilation and non-reassuring fetal status  POSTOPERATIVE DIAGNOSIS: The same, Uterine atony, cesarean supracervical hysterectomy, OB Hemorrhage, DIC  PROCEDURE: Primary Low Transverse Cesarean Section  SURGEON:  Dr. Candelaria Celeste  ASSISTANT: Dr Venora Maples  INDICATIONS: Tiffany [name] is a 38 y.o. [id number] at [redacted]w[redacted]d s/p cesarean section secondary to the indications listed above.  The risks of cesarean section discussed with the patient included but were not limited to: bleeding which may require transfusion or reoperation; infection which may require antibiotics; injury to bowel, bladder, ureters or other surrounding organs; injury to the fetus; need for additional procedures including hysterectomy in the event of a life-threatening hemorrhage; placental abnormalities wth subsequent pregnancies, incisional problems, thromboembolic phenomenon and other postoperative/anesthesia complications. The patient concurred with the proposed plan, giving informed written consent for the procedure.    FINDINGS:  Viable female infant in OT presentation.  Apgars 8 and 9, weight, 2931 grams.  Clear amniotic fluid.  Intact placenta, three vessel cord.  Normal uterus, fallopian tubes and ovaries bilaterally.  ANESTHESIA: Epidural INTRAVENOUS FLUIDS:3000 ml ESTIMATED BLOOD LOSS: 5211 ml URINE OUTPUT:  300 ml SPECIMENS: Placenta sent to pathology COMPLICATIONS: uterine atony, cesarean hysterectomy, DIC  PROCEDURE IN DETAIL:  The patient received intravenous antibiotics (ampicillin, gentamycin, clindamycin) and had sequential compression devices applied to her lower extremities while in the preoperative area. Prior to the surgery she was given 1g TXA prophylactically. She was then taken to the operating room where epidural anesthesia was dosed  up to surgical level and was found to be adequate. She was then placed in a dorsal supine position with a leftward tilt, and prepped and draped in a sterile manner.  A foley catheter was placed into her bladder and attached to constant gravity, which drained clear fluid throughout.  After an adequate timeout was performed, a Pfannenstiel skin incision was made with scalpel and carried through to the underlying layer of fascia. The fascia was incised in the midline and this incision was extended bluntly. The underlying rectus muscles were dissected off bluntly. A similar process was carried out on the inferior aspect of the facial incision. The rectus muscles were separated in the midline bluntly and the peritoneum was entered bluntly. An Alexis retractor was placed to aid in visualization of the uterus.  Attention was turned to the lower uterine segment where a transverse hysterotomy was made with a scalpel and extended bilaterally bluntly. The infant was successfully delivered, and cord was clamped and cut and infant was handed over to awaiting neonatology team. Uterine massage was then administered and the placenta delivered intact with three-vessel cord. The uterus was then cleared of clot and debris.  The hysterotomy was closed with 0 Vicryl in a running locked fashion, and an imbricating layer was also placed with a 0 Vicryl.   At this point significant oozing was noted across the hysterotomy and there was still significant bleeding at both corners. A figure of eight stitch was placed in the left corner with hemostasis. The right corner was difficult to visualize and despite several figure of eight sutures continued to have bleeding. An O'Leary stitch was placed on the R side however this only provoked further bleeding on the posterior aspect of the uterus. At that time patient also noted to have extremely poor uterine tone. An additional 1u of TXA was given as well as a dose of  hemabate. At this point OB  back up Dr. Kennon Rounds was asked to come to the OR and arrived shortly after.  Patient continued to have difficult to control bleeding. Intraoperative labs notable for hgb drop 11.6>8.4, platelets drop 263>61, prolonged INR 2.2 and fibrinogen 64 consistent with DIC. Patient given in total 2g TXA, 1030mcg misoprostol PR, 2 doses of IM hemabate and one intrauterine dose, methergine x1, and an additional 40u of pitocin. Patient also had B-Lynch sutures x3 placed. Despite interventions patient continued to have atony and ongoing bleeding, and once QBL had reached approximately 3L decision made to proceed with supracervical hysterectomy. See Dr. Virginia Crews note for further details of the operation, but notable significant oozing and ongoing difficult to control bleeding as well as receiving 7u pRBC, 5u FFP, 3u cryoprecipitate, 2u platelets.   Count correct at the end of the case x3, patient transferred in stable condition to MICU.    Clarnce Flock, MD 07/15/2019 10:29 PM

## 2019-07-15 NOTE — Transfer of Care (Signed)
Immediate Anesthesia Transfer of Care Note  Patient: Tiffany [name]  Procedure(s) Performed: CESAREAN SECTION (N/A ) HYSTERECTOMY ABDOMINAL (N/A Uterus)  Patient Location: PACU  Anesthesia Type:Epidural  Level of Consciousness: awake, alert  and oriented  Airway & Oxygen Therapy: Patient Spontanous Breathing  Post-op Assessment: Report given to RN and Post -op Vital signs reviewed and stable  Post vital signs: Reviewed and stable  Last Vitals:  Vitals Value Taken Time  BP    Temp 39.5 C 07/15/19 2250  Pulse 141 07/15/19 2303  Resp 24 07/15/19 2303  SpO2 93 % 07/15/19 2303  Vitals shown include unvalidated device data.  Last Pain:  Vitals:   07/15/19 2250  TempSrc: Oral  PainSc:       Patients Stated Pain Goal: 0 (67/34/19 3790)  Complications: No apparent anesthesia complications

## 2019-07-15 NOTE — Progress Notes (Signed)
Tiffany [name] is a 38 y.o. [id number] at [redacted]w[redacted]d  admitted for IOL for [place].  Subjective: Reports feeling comfortable with epidural in place. FOB present and supportive at bedside. Mild shortness of breath reported when lying supine, relieved when placed in [place] position.  Objective: Vitals:   07/15/19 1212 07/15/19 1231 07/15/19 1300 07/15/19 1331  BP:  100/63 104/74 (!) 89/63  Pulse:  88 (!) 119 (!) 124  Resp: 16 16 16    Temp:      TempSrc:      SpO2:      Weight:      Height:        I/O last 3 completed shifts: In: -  Out: 400 [Urine:400] No intake/output data recorded.   FHT:  FHR: 150 bpm, variability: moderate,  accelerations:  Present,  decelerations:  Present moderate variable decels UC:   regular, every 1-3 minutes SVE:   Dilation: 5 Effacement: 70% Station: 0 Exam by: Lin Givens, SNM  Labs:   Recent Labs    07/14/19 0844  WBC 9.9  HGB 11.6*  HCT 34.5*  PLT 263    Assessment / Plan: 38 y.o. [id number] at [redacted]w[redacted]d here for IOL for GDMA2  Labor: Progressing normally on Pitocin - IUPC placed with ease. Will continue to monitor FHT and plan for amnioinfusion if continued variable decelerations. Preeclampsia:  no signs or symptoms of toxicity and labs stable Fetal Wellbeing:  Category II - moderate variable decelerations Pain Control:  Epidural I/D:  GBS negative Anticipated MOD:  NSVD  Juanna Cao, CNM, BSN 07/15/2019, 1:53 PM

## 2019-07-15 NOTE — Anesthesia Postprocedure Evaluation (Signed)
Anesthesia Post Note  Patient: Tiffany [name]  Procedure(s) Performed: CESAREAN SECTION (N/A ) HYSTERECTOMY ABDOMINAL (N/A Uterus)     Patient location during evaluation: ICU Anesthesia Type: Epidural Level of consciousness: oriented and awake and alert Pain management: pain level controlled Vital Signs Assessment: post-procedure vital signs reviewed and stable Respiratory status: spontaneous breathing and respiratory function stable Cardiovascular status: blood pressure returned to baseline, stable and tachycardic Postop Assessment: no headache, no backache and no apparent nausea or vomiting Anesthetic complications: no Comments: MTP initiated for uterine atony, massive hemorrhage. Patient maintained with epidural for anesthesia and analgesia throughout. Hemodynamically stable throughout (tachycardiac and febrile at baseline prior to start of c section). Transported directly to ICU from [place] by crna and anesthesiologist without issue. Full report to ICU RN, NP and physician.    Last Vitals:  Vitals:   07/15/19 1849 07/15/19 2250  BP:    Pulse:  (!) 149  Resp:  (!) 27  Temp: (!) 38.2 C (!) 39.5 C  SpO2:  96%    Last Pain:  Vitals:   07/15/19 2250  TempSrc: Oral  PainSc:    Pain Goal: Patients Stated Pain Goal: 0 (07/14/19 2243)                 Pervis Hocking

## 2019-07-15 NOTE — Progress Notes (Signed)
Labor Progress Note Tiffany [name] is a 38 y.o. U2P5361 at [redacted]w[redacted]d presented for IOL for GDM  S:  Comfortable with epidural, feeling some rectal pressure.   O:  BP 111/73   Pulse (!) 119   Temp (!) 100.4 F (38 C) (Oral)   Resp 16   Ht 4\' 9"  (1.448 m)   Wt 48.4 kg   LMP 11/08/2018 (Exact Date)   SpO2 100%   BMI 23.11 kg/m  EFM: baseline 155 bpm/ min variability/ no accels/ no decels  Toco: 2-3 SVE: Dilation: 5 Effacement (%): 70 Cervical Position: Middle Station: 0 Presentation: Vertex Exam by:: Rosana Hoes, RN  Pitocin: 6 Kohan/min  A/P: 38 y.o. [id number] [redacted]w[redacted]d  1. Labor: no progress 2. FWB: Cat II 3. Pain: epidural  No cervical change although ctx are inadequate. Will continue Pitocin augmentation to achieve adequate labor if baby tolerates. Have already tried repositioning to help fetal rotation. Dr. Nehemiah Settle updated. Unclear MOD.    Julianne Handler, CNM 6:07 PM

## 2019-07-15 NOTE — Progress Notes (Addendum)
LABOR PROGRESS NOTE  Tiffany [name] is a 38 y.o. Q6P6195 at [redacted]w[redacted]d  admitted for IOL for [place].  Subjective: Patient is doing well at this time with no complaints. Denies nausea or vomiting. No pain. HA last night, resolved this morning.  Objective: BP 103/69   Pulse (!) 109   Temp 98.6 F (37 C) (Oral)   Resp 18   Ht 4\' 9"  (1.448 m)   Wt 48.4 kg   LMP 11/08/2018 (Exact Date)   SpO2 100%   BMI 23.11 kg/m  or  Vitals:   07/15/19 0731 07/15/19 0801 07/15/19 0831 07/15/19 0848  BP: 102/68 96/68 103/69   Pulse: 80 97 (!) 109   Resp: 18 18    Temp:    98.6 F (37 C)  TempSrc:    Oral  SpO2:      Weight:      Height:         Dilation: 4.5 Effacement (%): 70 Cervical Position: Middle Station: Ballotable Presentation: Vertex Exam by:: Rosana Hoes, RN  FHT: baseline rate 145, moderate varibility, + acel, no decel Toco: Every 1-2 minutes  Labs: Lab Results  Component Value Date   WBC 9.9 07/14/2019   HGB 11.6 (L) 07/14/2019   HCT 34.5 (L) 07/14/2019   MCV 92.2 07/14/2019   PLT 263 07/14/2019    Patient Active Problem List   Diagnosis Date Noted  . Gestational diabetes mellitus (GDM), antepartum 07/14/2019  . [redacted] weeks gestation of pregnancy 07/14/2019  . Proteinuria in pregnancy, antepartum 03/31/2019  . Supervision of high risk pregnancy, antepartum 03/26/2019  . AMA (advanced maternal age) multigravida 35+ 03/26/2019  . Elevated AFP 03/26/2019  . Anemia in pregnancy 03/26/2019  . GDM (gestational diabetes mellitus) 03/26/2019  . Language barrier 03/26/2019  . COVID-19 virus infection 01/15/2019  . Uterine mass 05/15/2014    Assessment / Plan: 38 y.o. [name] at [redacted]w[redacted]d here for IOL for GDM A2  Labor: latent labor is progressing. s/p Cytotec x1 and foley. Continue pitocin augmentation. Fetal Wellbeing: Category 1 Pain Control: Epidural  Anticipated MOD: Vaginal delivery  GDM A2: -Most recent blood glucose 104 (11/10, 07:05) -Continue every 4 hour blood glucose while  in latent labor and increase to every 2 hour glucose when in active labor  Hessie Dibble, PA-S2  07/15/2019, 9:18 AM

## 2019-07-15 NOTE — Consult Note (Signed)
Delivery Note:  C-section       07/15/2019  7:34 PM  I was called to the operating room at the request of the patient's obstetrician (Dr. Stinson) for a primary c-section.  PRENATAL HX:  This is a 38 y/o [name] at 39 and 1/[redacted] weeks gestation who was admitted yesterday for IOL due to A2 GDM.  She is on metformin.  Her pregnancy has otherwise been uncomplicated.  C-section for failure to progress, non-reassuring fetal heart tones (few recurrent late decelerations) and maternal temperature to 100.7.  She had gent and clindamycin just prior to delivery.  She is GBS negative, SROM x7 hours with light meconium.    DELIVERY:  Infant was vigorous at delivery, requiring no resuscitation other than standard warming, drying and stimulation.  APGARs 8 and 9.  Exam within normal limits.  After 5 minutes, baby left with nurse to assist parents with skin-to-skin care. Please notify NICU with any concern about infection regarding this infant, as there would be a low threshold to do a sepsis evaluation given maternal temperature.    _____________________ Electronically Signed By: Ilona Colley, MD Neonatologist  

## 2019-07-15 NOTE — Progress Notes (Signed)
LABOR PROGRESS NOTE  Karinne Croker is a 38 y.o. W9Q7591 at [redacted]w[redacted]d  admitted for IOL for [place].  Subjective: Patient is doing well at this time with no complaints.   Objective: BP 96/62   Pulse 100   Temp 98.3 F (36.8 C)   Resp 14   Ht 4\' 9"  (1.448 m)   Wt 48.4 kg   LMP 11/08/2018 (Exact Date)   SpO2 100%   BMI 23.11 kg/m  or  Vitals:   07/15/19 0201 07/15/19 0231 07/15/19 0301 07/15/19 0331  BP: (!) 90/51 95/61 98/63  96/62  Pulse: 95 92 93 100  Resp: 14 14 14 14   Temp: 98.3 F (36.8 C)     TempSrc:      SpO2:      Weight:      Height:         Dilation: 5 Effacement (%): 70 Cervical Position: Middle Station: -1 Presentation: Vertex Exam by:: Sia Johnny RN, Bri Price RN FHT: baseline rate 145, moderate varibility (there was minimal variability previously but improved with scalp stimulation), + acel, no decel Toco: Every 1-2 minutes  Labs: Lab Results  Component Value Date   WBC 9.9 07/14/2019   HGB 11.6 (L) 07/14/2019   HCT 34.5 (L) 07/14/2019   MCV 92.2 07/14/2019   PLT 263 07/14/2019    Patient Active Problem List   Diagnosis Date Noted  . Gestational diabetes mellitus (GDM), antepartum 07/14/2019  . [redacted] weeks gestation of pregnancy 07/14/2019  . Proteinuria in pregnancy, antepartum 03/31/2019  . Supervision of high risk pregnancy, antepartum 03/26/2019  . AMA (advanced maternal age) multigravida 35+ 03/26/2019  . Elevated AFP 03/26/2019  . Anemia in pregnancy 03/26/2019  . GDM (gestational diabetes mellitus) 03/26/2019  . Language barrier 03/26/2019  . COVID-19 virus infection 01/15/2019  . Uterine mass 05/15/2014    Assessment / Plan: 38 y.o. [name] at [redacted]w[redacted]d here for IOL for GDM A2  Labor: latent labor is progressing. s/p Cytotec x1 and foley. Was having borderline tachysystole which has improved. Holding at 4 of [place]. Continue augmentation  Fetal Wellbeing: Category 1, FHT improved with fetal scalp stimulation. Was most likely in sleep cycle.  Pain  Control: Epidural  Anticipated MOD: Vaginal delivery  GDM A2: -Most recent blood glucose 81 -Continue every 4 hour blood glucose while in latent labor and increase to every 2 hour glucose when in active labor  Maxie Better, MD, PGY1 OBGYN Faculty Teaching Service  07/15/2019, 4:10 AM

## 2019-07-15 NOTE — Op Note (Signed)
Preoperative diagnosis: Massive hemorrhage, uterine atony, DIC, s/p C-section  Postoperative diagnosis: Same  Procedure: Supracervical hysterectomy  Surgeon: Standley Dakins. Kennon Rounds, M.D.  Asst.: Loma Boston, MD An experienced assistant was required given the standard of surgical care given the complexity of the case.  This assistant was needed for exposure, dissection, suctioning, retraction, instrument exchange, and for overall help during the procedure.  Anesthesia: Epidural - Pervis Hocking, DO  Findings: boggy uterus  Estimated blood loss: 5200 cc  Specimen: Uterus to pathology  Reason for procedure: 38 y.o. [id number] with history of  C-section due to arrest of dilation. The C-section was complete on my arrival. The uterus was boggy and blood loss was approximately 2.5 L. Hgb was 6. Some blood was hung. See operative note by [facility] for the details of that procedure.  Procedure:  She was taken to the operating room and placed in a supine position. She received 2 gm Ancef and Clindamycin and Gentamycin and had SCD's in place. Her abdomen wa open and the hysterotomy closed. Bleeding from defect in posterior uterus closed with several figure of eights. Patient had received Methergine, Hemabate x 3, TXA x 2. The uterus was boggy. 3 B-Lynch sutures used to compress the uterus which had no tone. Continued oozing noted. When blood loss got to 3 L, decision made to proceed with  Hysterectomy.  MTP called. Kelley clamps were used to elevate the uterus. The round ligaments were identified clamped cut and ligated. A bladder flap was created anteriorly and the bladder was pushed out of the operative site with a moist lap sponge. The infundibulopelvic ligaments were identified bilaterally. They were clamped, cut, and doubly ligated. The uterine vessels were clamped, cut and ligated. The uterus was amputated, then removal of a bit more cervix done under the hysterotomy. The cervix closed with running  suture in several layers. Bleeding and oozing from all pedicles. More sewing, clamping, figure of eight sutures and pressure used. Patient received blood products and FFP and cryo and plts. All pedicles were noted to be mostly hemostatic. Three vials of Arista used to promote hemostasis The sponges were removed. The peritoneum closed in purse string fashion. The fascia was closed with a 0 Vicryl running  suture. The subcutaneous tissue was irrigated, clean, dry.  A skin was closed with done with 4-0 vicryl on a keith needle. She tolerated the procedure well and was taken to the recovery room in stable condition. Her Foley catheter drained red-tinged urine throughout. Her pulse came down to 150s at end of case though it had been 180s through much of the case. Anesthesia placed an A-line. After the case the fibrinogen was 64 and INR was 2.2.  Donnamae Jude MD 01/05/2014, 10:54 AM

## 2019-07-15 NOTE — Anesthesia Procedure Notes (Signed)
Arterial Line Insertion Start/End11/06/2019 9:42 PM, 07/15/2019 9:42 PM Performed by: Pervis Hocking, DO, anesthesiologist  Patient location: OR. Preanesthetic checklist: patient identified, IV checked, site marked, risks and benefits discussed, surgical consent, monitors and equipment checked, pre-op evaluation, timeout performed and anesthesia consent Lidocaine 1% used for infiltration Left, radial was placed Catheter size: 20 Fr Hand hygiene performed  and maximum sterile barriers used   Attempts: 2 Procedure performed without using ultrasound guided technique. Following insertion, dressing applied. Post procedure assessment: normal and unchanged

## 2019-07-15 NOTE — Progress Notes (Signed)
Patient ID: Tiffany [name], female   DOB: 09-17-1980, 38 y.o.   MRN: [id number]  Patient seen and examined. Had a few recurrent lates again with minimal variability. Cervix examined - unchanged and mildly edematous. Baby feels ROT - unchanged despite trying to turn mom in various positions. Will proceed with cesarean delivery.   The risks of cesarean section discussed with the patient included but were not limited to: bleeding which may require transfusion or reoperation; infection which may require antibiotics; injury to bowel, bladder, ureters or other surrounding organs; injury to the fetus; need for additional procedures including hysterectomy in the event of a life-threatening hemorrhage; placental abnormalities wth subsequent pregnancies, incisional problems, thromboembolic phenomenon and other postoperative/anesthesia complications. The patient concurred with the proposed plan, giving informed written consent for the procedure.   Patient has been NPO since last night, she will remain NPO for procedure. Anesthesia and OR aware.  Preoperative prophylactic Ancef ordered on call to the OR.  To OR when ready.  Truett Mainland, DO 07/15/2019 6:52 PM

## 2019-07-15 NOTE — Progress Notes (Signed)
Patient ID: Tiffany [name], female   DOB: Aug 03, 1981, 38 y.o.   MRN: [id number]  Patient has fever: 100.7. Change antibiotics to amp/clind/gent.  Truett Mainland, DO 07/15/2019 7:12 PM

## 2019-07-16 ENCOUNTER — Encounter (HOSPITAL_COMMUNITY): Payer: Self-pay | Admitting: Family Medicine

## 2019-07-16 DIAGNOSIS — N179 Acute kidney failure, unspecified: Secondary | ICD-10-CM | POA: Diagnosis not present

## 2019-07-16 DIAGNOSIS — D65 Disseminated intravascular coagulation [defibrination syndrome]: Secondary | ICD-10-CM

## 2019-07-16 DIAGNOSIS — O99013 Anemia complicating pregnancy, third trimester: Secondary | ICD-10-CM

## 2019-07-16 DIAGNOSIS — R58 Hemorrhage, not elsewhere classified: Secondary | ICD-10-CM | POA: Diagnosis not present

## 2019-07-16 DIAGNOSIS — O411230 Chorioamnionitis, third trimester, not applicable or unspecified: Secondary | ICD-10-CM

## 2019-07-16 LAB — CBC
HCT: 27.7 % — ABNORMAL LOW (ref 36.0–46.0)
HCT: 27.3 % — ABNORMAL LOW (ref 36.0–46.0)
Hemoglobin: 9.7 g/dL — ABNORMAL LOW (ref 12.0–15.0)
Hemoglobin: 9.8 g/dL — ABNORMAL LOW (ref 12.0–15.0)
MCH: 29.3 pg (ref 26.0–34.0)
MCH: 30 pg (ref 26.0–34.0)
MCHC: 35 g/dL (ref 30.0–36.0)
MCHC: 35.9 g/dL (ref 30.0–36.0)
MCV: 83.7 fL (ref 80.0–100.0)
MCV: 83.5 fL (ref 80.0–100.0)
Platelets: 90 10*3/uL — ABNORMAL LOW (ref 150–400)
Platelets: 90 10*3/uL — ABNORMAL LOW (ref 150–400)
RBC: 3.31 MIL/uL — ABNORMAL LOW (ref 3.87–5.11)
RBC: 3.27 MIL/uL — ABNORMAL LOW (ref 3.87–5.11)
RDW: 14.6 % (ref 11.5–15.5)
RDW: 14.9 % (ref 11.5–15.5)
WBC: 16.1 10*3/uL — ABNORMAL HIGH (ref 4.0–10.5)
WBC: 15.9 10*3/uL — ABNORMAL HIGH (ref 4.0–10.5)
nRBC: 0 % (ref 0.0–0.2)
nRBC: 0 % (ref 0.0–0.2)

## 2019-07-16 LAB — BASIC METABOLIC PANEL
Anion gap: 9 (ref 5–15)
BUN: 11 mg/dL (ref 6–20)
CO2: 21 mmol/L — ABNORMAL LOW (ref 22–32)
Calcium: 7.7 mg/dL — ABNORMAL LOW (ref 8.9–10.3)
Chloride: 105 mmol/L (ref 98–111)
Creatinine, Ser: 0.87 mg/dL (ref 0.44–1.00)
GFR calc Af Amer: >60 mL/min (ref >60)
GFR calc non Af Amer: >60 mL/min (ref >60)
Glucose, Bld: 95 mg/dL (ref 70–99)
Potassium: 3.7 mmol/L (ref 3.5–5.1)
Sodium: 135 mmol/L (ref 135–145)

## 2019-07-16 LAB — PROTIME-INR
INR: 1.3 — ABNORMAL HIGH (ref 0.8–1.2)
Prothrombin Time: 15.7 seconds — ABNORMAL HIGH (ref 11.4–15.2)

## 2019-07-16 LAB — BPAM CRYOPRECIPITATE
Blood Product Expiration Date: 202011110240
Blood Product Expiration Date: 202011110324
Blood Product Expiration Date: 202011110324
ISSUE DATE / TIME: 202011102055
ISSUE DATE / TIME: 202011102143
ISSUE DATE / TIME: 202011102143
Unit Type and Rh: 5100
Unit Type and Rh: 5100
Unit Type and Rh: 5100

## 2019-07-16 LAB — PREPARE CRYOPRECIPITATE
Unit division: 0
Unit division: 0
Unit division: 0

## 2019-07-16 LAB — URINALYSIS, ROUTINE W REFLEX MICROSCOPIC
Bilirubin Urine: NEGATIVE
Glucose, UA: NEGATIVE mg/dL
Ketones, ur: NEGATIVE mg/dL
Leukocytes,Ua: NEGATIVE
Nitrite: NEGATIVE
Protein, ur: 30 mg/dL — AB
RBC / HPF: >50 RBC/hpf — ABNORMAL HIGH (ref 0–5)
Specific Gravity, Urine: 1.011 (ref 1.005–1.030)
WBC, UA: >50 WBC/hpf — ABNORMAL HIGH (ref 0–5)
pH: 5 (ref 5.0–8.0)

## 2019-07-16 LAB — CBC WITH DIFFERENTIAL/PLATELET
Abs Immature Granulocytes: 0.07 10*3/uL (ref 0.00–0.07)
Basophils Absolute: 0 10*3/uL (ref 0.0–0.1)
Basophils Relative: 0 %
Eosinophils Absolute: 0 10*3/uL (ref 0.0–0.5)
Eosinophils Relative: 0 %
HCT: 29.2 % — ABNORMAL LOW (ref 36.0–46.0)
Hemoglobin: 10.1 g/dL — ABNORMAL LOW (ref 12.0–15.0)
Immature Granulocytes: 1 %
Lymphocytes Relative: 6 %
Lymphs Abs: 0.9 10*3/uL (ref 0.7–4.0)
MCH: 29.5 pg (ref 26.0–34.0)
MCHC: 34.6 g/dL (ref 30.0–36.0)
MCV: 85.4 fL (ref 80.0–100.0)
Monocytes Absolute: 0.8 10*3/uL (ref 0.1–1.0)
Monocytes Relative: 6 %
Neutro Abs: 13.2 10*3/uL — ABNORMAL HIGH (ref 1.7–7.7)
Neutrophils Relative %: 87 %
Platelets: 115 10*3/uL — ABNORMAL LOW (ref 150–400)
RBC: 3.42 MIL/uL — ABNORMAL LOW (ref 3.87–5.11)
RDW: 14.5 % (ref 11.5–15.5)
WBC: 15 10*3/uL — ABNORMAL HIGH (ref 4.0–10.5)
nRBC: 0 % (ref 0.0–0.2)

## 2019-07-16 LAB — COMPREHENSIVE METABOLIC PANEL
ALT: 20 U/L (ref 0–44)
ALT: 23 U/L (ref 0–44)
AST: 57 U/L — ABNORMAL HIGH (ref 15–41)
AST: 78 U/L — ABNORMAL HIGH (ref 15–41)
Albumin: 2 g/dL — ABNORMAL LOW (ref 3.5–5.0)
Albumin: 1.9 g/dL — ABNORMAL LOW (ref 3.5–5.0)
Alkaline Phosphatase: 51 U/L (ref 38–126)
Alkaline Phosphatase: 48 U/L (ref 38–126)
Anion gap: 13 (ref 5–15)
Anion gap: 11 (ref 5–15)
BUN: 11 mg/dL (ref 6–20)
BUN: 10 mg/dL (ref 6–20)
CO2: 19 mmol/L — ABNORMAL LOW (ref 22–32)
CO2: 21 mmol/L — ABNORMAL LOW (ref 22–32)
Calcium: 8.1 mg/dL — ABNORMAL LOW (ref 8.9–10.3)
Calcium: 7.5 mg/dL — ABNORMAL LOW (ref 8.9–10.3)
Chloride: 103 mmol/L (ref 98–111)
Chloride: 107 mmol/L (ref 98–111)
Creatinine, Ser: 1.28 mg/dL — ABNORMAL HIGH (ref 0.44–1.00)
Creatinine, Ser: 0.88 mg/dL (ref 0.44–1.00)
GFR calc Af Amer: >60 mL/min (ref >60)
GFR calc Af Amer: >60 mL/min (ref >60)
GFR calc non Af Amer: 53 mL/min — ABNORMAL LOW (ref >60)
GFR calc non Af Amer: >60 mL/min (ref >60)
Glucose, Bld: 174 mg/dL — ABNORMAL HIGH (ref 70–99)
Glucose, Bld: 74 mg/dL (ref 70–99)
Potassium: 3.4 mmol/L — ABNORMAL LOW (ref 3.5–5.1)
Potassium: 3.6 mmol/L (ref 3.5–5.1)
Sodium: 135 mmol/L (ref 135–145)
Sodium: 139 mmol/L (ref 135–145)
Total Bilirubin: 0.7 mg/dL (ref 0.3–1.2)
Total Bilirubin: 0.5 mg/dL (ref 0.3–1.2)
Total Protein: 4 g/dL — ABNORMAL LOW (ref 6.5–8.1)
Total Protein: 3.8 g/dL — ABNORMAL LOW (ref 6.5–8.1)

## 2019-07-16 LAB — BPAM PLATELET PHERESIS
Blood Product Expiration Date: 202011112216
Blood Product Expiration Date: 202011122359
Blood Product Expiration Date: 202011132359
Blood Product Expiration Date: 202011132359
ISSUE DATE / TIME: 202011102040
ISSUE DATE / TIME: 202011102056
ISSUE DATE / TIME: 202011102217
ISSUE DATE / TIME: 202011102217
Unit Type and Rh: 6200
Unit Type and Rh: 6200
Unit Type and Rh: 6200
Unit Type and Rh: 7300

## 2019-07-16 LAB — GLUCOSE, CAPILLARY
Glucose-Capillary: 132 mg/dL — ABNORMAL HIGH (ref 70–99)
Glucose-Capillary: 151 mg/dL — ABNORMAL HIGH (ref 70–99)
Glucose-Capillary: 121 mg/dL — ABNORMAL HIGH (ref 70–99)
Glucose-Capillary: 96 mg/dL (ref 70–99)

## 2019-07-16 LAB — PREPARE PLATELET PHERESIS
Unit division: 0
Unit division: 0
Unit division: 0
Unit division: 0

## 2019-07-16 LAB — LACTIC ACID, PLASMA
Lactic Acid, Venous: 4.9 mmol/L (ref 0.5–1.9)
Lactic Acid, Venous: 4 mmol/L (ref 0.5–1.9)
Lactic Acid, Venous: 2.6 mmol/L (ref 0.5–1.9)
Lactic Acid, Venous: 2.1 mmol/L (ref 0.5–1.9)

## 2019-07-16 LAB — FIBRINOGEN: Fibrinogen: 325 mg/dL (ref 210–475)

## 2019-07-16 LAB — MRSA PCR SCREENING: MRSA by PCR: POSITIVE — AB

## 2019-07-16 LAB — PREPARE RBC (CROSSMATCH)

## 2019-07-16 MED ORDER — SODIUM CHLORIDE 0.9% IV SOLUTION
Freq: Once | INTRAVENOUS | Status: AC
  Administered 2019-07-16: 02:00:00 via INTRAVENOUS

## 2019-07-16 MED ORDER — OXYCODONE HCL 5 MG PO TABS
5.0000 mg | ORAL_TABLET | ORAL | Status: DC | PRN
  Administered 2019-07-16 – 2019-07-17 (×5): 10 mg via ORAL
  Administered 2019-07-18: 5 mg via ORAL
  Administered 2019-07-18 (×2): 10 mg via ORAL
  Administered 2019-07-19 (×2): 5 mg via ORAL
  Filled 2019-07-16 (×2): qty 2
  Filled 2019-07-16: qty 1
  Filled 2019-07-16 (×2): qty 2
  Filled 2019-07-16: qty 1
  Filled 2019-07-16: qty 2
  Filled 2019-07-16: qty 1
  Filled 2019-07-16 (×2): qty 2

## 2019-07-16 MED ORDER — SODIUM CHLORIDE 0.9 % IV SOLN
3.0000 g | Freq: Four times a day (QID) | INTRAVENOUS | Status: AC
  Administered 2019-07-16 (×2): 3 g via INTRAVENOUS
  Filled 2019-07-16 (×3): qty 8

## 2019-07-16 MED ORDER — INSULIN ASPART 100 UNIT/ML ~~LOC~~ SOLN: 0.0000 [IU] | Freq: Every day | SUBCUTANEOUS | Status: DC

## 2019-07-16 MED ORDER — IBUPROFEN 200 MG PO TABS: 600.0000 mg | ORAL_TABLET | Freq: Four times a day (QID) | ORAL | Status: DC | PRN

## 2019-07-16 MED ORDER — ENOXAPARIN SODIUM 30 MG/0.3ML ~~LOC~~ SOLN
30.0000 mg | SUBCUTANEOUS | Status: DC
  Administered 2019-07-16: 23:00:00 30 mg via SUBCUTANEOUS
  Filled 2019-07-16 (×2): qty 0.3

## 2019-07-16 MED ORDER — OXYCODONE HCL 5 MG PO TABS: 5.0000 mg | ORAL_TABLET | ORAL | Status: DC | PRN

## 2019-07-16 MED ORDER — SENNOSIDES-DOCUSATE SODIUM 8.6-50 MG PO TABS
2.0000 | ORAL_TABLET | Freq: Every evening | ORAL | Status: DC | PRN
  Administered 2019-07-18: 23:00:00 2 via ORAL
  Filled 2019-07-16: qty 2

## 2019-07-16 MED ORDER — GENTAMICIN SULFATE 40 MG/ML IJ SOLN
5.0000 mg/kg | Freq: Once | INTRAVENOUS | Status: DC
  Filled 2019-07-16: qty 6

## 2019-07-16 MED ORDER — INSULIN ASPART 100 UNIT/ML ~~LOC~~ SOLN
0.0000 [IU] | Freq: Three times a day (TID) | SUBCUTANEOUS | Status: DC
  Administered 2019-07-16: 2 [IU] via SUBCUTANEOUS

## 2019-07-16 MED ORDER — LACTATED RINGERS IV BOLUS
500.0000 mL | Freq: Once | INTRAVENOUS | Status: AC
  Administered 2019-07-16: 500 mL via INTRAVENOUS

## 2019-07-16 MED ORDER — ORAL CARE MOUTH RINSE
15.0000 mL | Freq: Two times a day (BID) | OROMUCOSAL | Status: DC
  Administered 2019-07-16: 12:00:00 15 mL via OROMUCOSAL

## 2019-07-16 MED ORDER — HYDROMORPHONE HCL 1 MG/ML IJ SOLN: 1.0000 mg | INTRAMUSCULAR | Status: DC | PRN

## 2019-07-16 MED ORDER — KETOROLAC TROMETHAMINE 15 MG/ML IJ SOLN: 15.0000 mg | Freq: Four times a day (QID) | INTRAMUSCULAR | Status: DC

## 2019-07-16 MED ORDER — SODIUM CHLORIDE 0.9% IV SOLUTION
Freq: Once | INTRAVENOUS | Status: AC
  Administered 2019-07-16: 01:00:00 via INTRAVENOUS

## 2019-07-16 MED ORDER — OXYCODONE-ACETAMINOPHEN 5-325 MG PO TABS
2.0000 | ORAL_TABLET | ORAL | Status: DC | PRN
  Administered 2019-07-16: 09:00:00 2 via ORAL
  Filled 2019-07-16 (×2): qty 2

## 2019-07-16 MED FILL — Medication: Qty: 1 | Status: AC

## 2019-07-16 NOTE — Progress Notes (Signed)
CRITICAL VALUE ALERT  Critical Value:  Lactic 4.9  Date & Time Notied:  07/16/19 0111  Provider Notified: Warren Lacy  Orders Received/Actions taken:

## 2019-07-16 NOTE — Progress Notes (Signed)
pts second lactic acid:4.  Relayed to Dr. Loanne Drilling.  Plan to bolus and recheck.

## 2019-07-16 NOTE — Progress Notes (Signed)
Pt has bed on 4S room 12.  Report given to [facility], receiving RN.

## 2019-07-16 NOTE — Progress Notes (Signed)
Report given to Gerald Stabs, receiving RN [facility] Specialty unit.  Pt to be transported by bed.

## 2019-07-16 NOTE — Progress Notes (Signed)
[place] Progress Note Patient Name: Tiffany [name] DOB: 04/11/1981 MRN: [id number]   Date of Service  07/16/2019  HPI/Events of Note  Hemoglobin  6.1, lactate 4.9  eICU Interventions  Transfuse 1 unit Prbc        Okoron[place]wo U Ogan 07/16/2019, 1:27 AM

## 2019-07-16 NOTE — Progress Notes (Signed)
Subjective: Postpartum Day 1: Cesarean Hysterectomy Delivery Patient reports incisional pain and tolerating PO. No major events overnight. Received one additional unit of blood.    Objective: Vital signs in last 24 hours: Temp:  [98.4 F (36.9 C)-103.1 F (39.5 C)] 98.9 F (37.2 C) (11/11 0350) Pulse Rate:  [3-149] 97 (11/11 0600) Resp:  [10-27] 26 (11/11 0600) BP: (81-113)/(30-85) 108/83 (11/11 0600) SpO2:  [94 %-99 %] 94 % (11/11 0600) Arterial Line BP: (90-128)/(55-76) 114/67 (11/11 0600)  11/10 0701 - 11/11 0700 In: 10383.2 [I.V.:4350; Blood:5533.2; IV Piggyback:500] Out: 4010 [Urine:2825; Blood:5271]   Physical Exam:  General: alert, cooperative and no distress Lochia: none Incision: no dehiscence. No drainage or bleeding from incision DVT Evaluation: No evidence of DVT seen on physical exam. Negative Homan's sign. No cords or calf tenderness. No significant calf/ankle edema.  CBC Latest Ref Rng & Units 07/16/2019 07/15/2019 07/15/2019  WBC 4.0 - 10.5 K/uL 15.0(H) - 6.1  Hemoglobin 12.0 - 15.0 g/dL 10.1(L) 6.1(LL) 8.4(L)  Hematocrit 36.0 - 46.0 % 29.2(L) 18.0(L) 24.8(L)  Platelets 150 - 400 K/uL 115(L) - 61(L)   CMP Latest Ref Rng & Units 07/16/2019 07/15/2019 07/15/2019  Glucose 70 - 99 mg/dL 174(H) - 172(H)  BUN 6 - 20 mg/dL 11 - 10  Creatinine 0.44 - 1.00 mg/dL 1.28(H) - 1.10(H)  Sodium 135 - 145 mmol/L 135 138 138  Potassium 3.5 - 5.1 mmol/L 3.4(L) 4.5 4.9  Chloride 98 - 111 mmol/L 103 - 107  CO2 22 - 32 mmol/L 19(L) - 11(L)  Calcium 8.9 - 10.3 mg/dL 8.1(L) - 6.0(LL)  Total Protein 6.5 - 8.1 g/dL 4.0(L) - <3.0(L)  Total Bilirubin 0.3 - 1.2 mg/dL 0.7 - 1.0  Alkaline Phos 38 - 126 U/L 51 - 54  AST 15 - 41 U/L 57(H) - 22  ALT 0 - 44 U/L 20 - 10     Assessment/Plan: 1. POD#1 cesarean hysterectomy  Details of the surgery discussed with the patient and her husband, both last night, as well as this morning.  Continue postoperative pain control  Hold off  on NSAIDs due to elevated Cr and need for CT with contrast 2. Intrauterine infection  Continue abx (Amp/clind/gent) 24 hours after surgery to prevent pelvic infection 3. DIC  Bleeding controlled  Fibrinogen corrected 4. Acute blood loss anemia  Hg stable. Will continue to follow 5. A2 GDM  Glucose elevated from CMP this AM.   Will place on SSI with CBGs AC and qhs. 6. Massive (Class 3) postpartum hemorrhage  Bleeding controlled 7. AKI  Cr still a little elevated (normal Cr in pregnant/postpartum patient should be around 0.5-0.6). Will repeat Cr this evening. Will hold off on NSAIDs, especially due to needing CT with contrast.  Urine output good after lasix at the end of surgery - urine clear. Despite the good urine output, she still needs CT abd/pelvis to assure no unilateral ureteral damage during surgery. This was discussed with the patient.   Tiffany [name] 07/16/2019, 7:36 AM

## 2019-07-16 NOTE — Lactation Note (Addendum)
This note was copied from a baby's chart. Lactation Consultation Note  Patient Name: Tiffany [name] Today's Date: 07/16/2019 Reason for consult: Initial assessment;Term;Maternal endocrine disorder Type of Endocrine Disorder?: Diabetes P3, 5 hour female infant in Central Nursery  but will be moved to room 412  with dad. Mom is in ICU  Room 3M05 , due hemorrhaging and hysterometry  with C/S delivery .  Interpreter used [phone] # [id number] language is Santiago Glad. Mom's feeding choice at admission was breast and formula feeding. Mom was very tired and said it was her first time breastfeeding.  Nurse is assisting mom in holding breast flanges when using medela DEBP, Mom shown how to use DEBP & how to disassemble, clean, & reassemble parts. [place] brought Medela DEBP to ICU ([id number] room [place])  Mom knows to pump every 3 hours both breast for 15 minutes on initial setting. Mom taught back hand expression a few drops of colostrum seen. Mom know to ask Nurse to call [facility] services if she has any questions, concerns or need assistance with latching infant to breast.     Maternal Data Formula Feeding for Exclusion: Yes Reason for exclusion: Mother's choice to formula and breast feed on admission Has patient been taught Hand Expression?: Yes(Drops of colostrum present.) Does the patient have breastfeeding experience prior to this delivery?: Yes  Feeding Feeding Type: Bottle Fed - Formula Nipple Type: Slow - flow  LATCH Score                   Interventions Interventions: Breast feeding basics reviewed;Hand express;DEBP  Lactation Tools Discussed/Used WIC Program: No Pump Review: Setup, frequency, and cleaning;Milk Storage Initiated by:: Vicente Serene, IBCLC Date initiated:: 07/16/19   Consult Status Consult Status: Follow-up Date: 07/16/19 Follow-up type: In-patient    Vicente Serene 07/16/2019, 12:39 AM

## 2019-07-16 NOTE — Progress Notes (Signed)
Received call back from [place], [place] 4S.  Stanton Kidney states spoke w/ both charge RN and [facility] who want to speak w/ MD again before transferring pt due to concerns about pts acuity. Stanton Kidney states will call back.

## 2019-07-16 NOTE — Progress Notes (Signed)
Pharmacy Antibiotic Note  Tiffany [name] is a 38 y.o. female admitted on 07/14/2019 with Chorioamnionitis.  Pharmacy has been consulted for Gentamicin dosing for 24 hours.   WBC WNL. Renal function OK.  Gentamicin 240 mg IV x 1 given 11/10 at 2355  Plan: -Gentamicin 240 mg IV x 1 24 hours from previous dose  Height: 4\' 9"  (144.8 cm) Weight: 106 lb 12.8 oz (48.4 kg) IBW/kg (Calculated) : 38.6  Temp (24hrs), Avg:100 F (37.8 C), Min:98.1 F (36.7 C), Max:103.1 F (39.5 C)  Recent Labs  Lab 07/14/19 0844 07/15/19 2150 07/16/19 0028  WBC 9.9 6.1  --   CREATININE  --  1.10*  --   LATICACIDVEN  --   --  4.9*    Estimated Creatinine Clearance: 46.1 mL/min (A) (by C-G formula based on SCr of 1.1 mg/dL (H)).    No Known Allergies  Narda Bonds, PharmD, BCPS Clinical Pharmacist Phone: [phone]

## 2019-07-16 NOTE — Progress Notes (Signed)
Pts noon labs back, creatinine now in normal range, lactic acid: 2.6.  Spoke w/ Dr. Ilda Basset to relay.  Per Dr. Ilda Basset, pt ok to transfer back to OB.

## 2019-07-16 NOTE — Lactation Note (Signed)
This note was copied from a baby's chart. Lactation Consultation Note Baby 44 hrs old. Parents speak Santiago Glad, [place] Interpreter [phone] Ron Agee used for consult. Mom had asked Nursing who was going to pump her breast. DEBP at bedside and mom had been told to pump but may have been tired. Not remembering. LC explained d/t mom having surgery and being in the bed not very mobile at this time, FOB will need to assist mom. FOB agreed. Demonstrated and taught how to use DEBP to mom and FOB. Parents states understanding. Newborn behavior, feeding STS, importance of I&O, breast massage, milk storage, supply and demand discussed. Cluster feeding and when to supplement discussed. Mom asked if baby could go to the breast while taking all the medications she is on, LC explained yes. Mom has everted nipples. Hand expression w/colostrum noted. Mom picking dry skin off and wiping nipples. Encouraged to leave colostrum on nipples that it was good for her nipples. Mom asked for nipple cream. Encouraged to do colostrum then apply coconut oil when pumping. Placed baby in football position and assisted in latching. Baby done well. [place] repositioned mom prior to latching. Mom has elongated nipples. Baby latches w/o difficulty. Encouraged chin tug if pinching. Answered questions via interpreter.  Discussed w/RN baby born over 49.lbs so unless MD orders 22 cal. Baby needs 20 cal. Similac. FOB at bedside assisting and helping mom.  Mom stated her youngest child is 30 yrs old. Mom BF all of her children until they were 10 yr old. Mom using DEBP when [place] left.  Patient Name: Tiffany [name] XAJOI'N Date: 07/16/2019 Reason for consult: Follow-up assessment;Term   Maternal Data    Feeding Feeding Type: Breast Fed Nipple Type: Slow - flow  LATCH Score Latch: Grasps breast easily, tongue down, lips flanged, rhythmical sucking.  Audible Swallowing: A few with stimulation  Type of Nipple: Everted at rest and after  stimulation  Comfort (Breast/Nipple): Soft / non-tender  Hold (Positioning): Assistance needed to correctly position infant at breast and maintain latch.  LATCH Score: 8  Interventions Interventions: Breast feeding basics reviewed;Adjust position;DEBP;Assisted with latch;Support pillows;Skin to skin;Position options;Breast massage;Hand express;Breast compression  Lactation Tools Discussed/Used Tools: Pump Breast pump type: Double-Electric Breast Pump Pump Review: Setup, frequency, and cleaning;Milk Storage   Consult Status Consult Status: Follow-up Date: 07/17/19 Follow-up type: In-patient    Theodoro Kalata 07/16/2019, 11:28 PM

## 2019-07-16 NOTE — Addendum Note (Signed)
Addendum  created 07/16/19 0658 by Pervis Hocking, DO   Clinical Note Signed, Flowsheet accepted, LDA removed

## 2019-07-16 NOTE — Progress Notes (Signed)
Labs checked this morning, normal coags. Pt seen doing well in ICU. Epidural pulled by myself. No erythema, edema or hematoma at epidural site.

## 2019-07-16 NOTE — Progress Notes (Signed)
PCCM Interval Note  Patient remained hemodynamically stable during the day. H/H stable with no further transfusions. Transferred to floor by primary team. PCCM will sign off.   Thank you for involving [place] in the care of your patient. Please call as needed.  Rodman Pickle, M.D. [facility] Pulmonary/Critical Care Medicine 07/16/2019 6:35 PM

## 2019-07-16 NOTE — Progress Notes (Signed)
Spoke w/ Stanton Kidney, RN 4S who states pt now going to [facility] room 103.  Called [facility] to attempt to give report.  Was told RN unable to take report at this time. Provided number for call back.

## 2019-07-16 NOTE — Consult Note (Signed)
NAMMaggie Font:  Tiffany [name], MRN:  [id number], DOB:  01/12/1981, LOS: 2 ADMISSION DATE:  07/14/2019, CONSULTATION DATE:  11/10 REFERRING MD:  Dr Adrian BlackwaterStinson [facility], CHIEF COMPLAINT:  Intraoperative hemorrhage  Brief History   38 year old female admitted 11/9 for induction of labor. Ultimately required cesarean section, which was complicated by hemorrhage requiring PRBC (9 units), FFP (6 units), Platelets (4 units), and cryo (4 units) as of morning of 11/11.   History of present illness   38 year old female with PMH as below, which is significant for [id number] with intrauterine pregnancy at 39 weeks at the time of presentation on 11/9. Pregnancy complicated by GDMA2 and COVID pneumonia.  Admitted 11/9 for induction of labor. In the morning hours of 11/10 her labor was failing to progress despite pitocin and fetal monitoring was picking up some decelerations. For these reasons she was taken for cesarean section, which was complicated by uterine oozing, which was unable to be stopped and the decision was made to proceed with hysterectomy after EBL of 3L. She received a total of 7 units PRBC, 5 FFP, 3 Platelets, and 3 units of cryo. She was never hypotensive and surgery was done under epidural alone. Post-operatively she was transferred to medical ICU for recovery. PCCM consulted.   Past Medical History   has a past medical history of Appendicitis, COVID-19 (01/2019), and Medical history non-contributory.  Significant Hospital Events   11/9 Admit 11/10 Cesarean section complicated by hemorrhage and ultimately required hysterectomy and mass transfusion  Consults:  PCCM OB Neonatology  Procedures:  11/10 >> Cesarean section, emergent hysterectomy  Significant Diagnostic Tests:    Micro Data:  COVID 11/9 > negative Blood Cx 11/10 >   Antimicrobials:  Ancef 11/10 >> Clindamycin 11/10 >> Gentamycin 11/10 >>  Interim history/subjective:  RN reports no acute events overnight, attempted to utilize interpreter to  assist in assessment but was unable to make contact. Reports ome incisional pain.   Objective   Blood pressure 108/83, pulse 97, temperature 98.9 F (37.2 C), temperature source Axillary, resp. rate (!) 26, height 4\' 9"  (1.448 m), weight 48.4 kg, last menstrual period 11/08/2018, SpO2 94 %.        Intake/Output Summary (Last 24 hours) at 07/16/2019 0754 Last data filed at 07/16/2019 0500 Gross per 24 hour  Intake 10383.17 ml  Output 8096 ml  Net 2287.17 ml   Filed Weights   07/14/19 0856  Weight: 48.4 kg    Examination: General: Adult female in NAD HEENT: Normocephalic, atraumatic,  MM pink/moist, PERRL,  Neuro: Alert and oriented, non focal  CV: s1s2 regular rate and rhythm, no murmur, rubs, or gallops,  PULM:  Clear to ascultation bilaterally, no increased work of breathing  GI: soft, bowel sounds active in all 4 quadrants, non-tender, non-distended, lower abdominal surgical incision clean dry and intact  Extremities: warm/dry, no  edema  Skin: no rashes or lesions  Resolved Hospital Problem list     Assessment & Plan:   Hypovolemic shock  Intraoperative hemorrhage s/p cesarean with hysterectomy  DIC -During cesarean section delivery. Emergent hysterectomy done during the case. EBL 3L.  -Total product administration as follows; 9 units PRBC, 6 units FFP, 4 units plantlet, 4 units cryo as of morning of 11/11 -DIC seems like it had started prior to surgery as urine had become bloody.  P: H&H stable as of this morning, repeat CBC at noon Repeat iStat to assess ionized Ca now  Continue close hemodynamic monitoring in ICU Continue  postop pain control   Hematuria  Mild AKI -Originally thought to be secondary to DIC, primary team to assess CT to ensure no ureter damage during surgery  P: Continue Foley Monitor renal output Avoid nephrotoxins  Trend bmet Ensure adequate renal perfusion  Hold CT scan until after Bmet this afternoon  Consider fluid bolus after Bmet  this afternoon  Fever -Concern for chorioamnionitis P: Continue antibiotics per OB currently on ancef, gentamycin, and clindamycin Follow cultures   Gestational diabetes P: SSI CBG checks ACHS  Anion gap acidosis -Resolved, urine negative for glucose/ketones  P: Trend lactic acid and Bmet   Best practice:  Diet: Clears Pain/Anxiety/Delirium protocol (if indicated): Oral opoid's per primary  VAP protocol (if indicated): na DVT prophylaxis: na GI prophylaxis: na Glucose control: CBG/SSI Mobility: BR Code Status: FULL Family Communication: Patient updated via tele-interpreter Disposition: ICU  Labs   CBC: Recent Labs  Lab 07/14/19 0844 07/15/19 2145 07/15/19 2150 07/15/19 2222 07/16/19 0526  WBC 9.9  --  6.1  --  15.0*  NEUTROABS  --   --   --   --  13.2*  HGB 11.6* 6.8* 8.4* 6.1* 10.1*  HCT 34.5* 20.0* 24.8* 18.0* 29.2*  MCV 92.2  --  88.6  --  85.4  PLT 263  --  61*  --  115*    Basic Metabolic Panel: Recent Labs  Lab 07/15/19 2145 07/15/19 2150 07/15/19 2222 07/16/19 0526  NA 139 138 138 135  K 4.9 4.9 4.5 3.4*  CL  --  107  --  103  CO2  --  11*  --  19*  GLUCOSE  --  172*  --  174*  BUN  --  10  --  11  CREATININE  --  1.10*  --  1.28*  CALCIUM  --  6.0*  --  8.1*   GFR: Estimated Creatinine Clearance: 39.6 mL/min (A) (by C-G formula based on SCr of 1.28 mg/dL (H)). Recent Labs  Lab 07/14/19 0844 07/15/19 2150 07/16/19 0028 07/16/19 0526  WBC 9.9 6.1  --  15.0*  LATICACIDVEN  --   --  4.9*  --     Liver Function Tests: Recent Labs  Lab 07/15/19 2150 07/16/19 0526  AST 22 57*  ALT 10 20  ALKPHOS 54 51  BILITOT 1.0 0.7  PROT <3.0* 4.0*  ALBUMIN 1.6* 2.0*   No results for input(s): LIPASE, AMYLASE in the last 168 hours. No results for input(s): AMMONIA in the last 168 hours.  ABG    Component Value Date/Time   PHART 7.377 07/15/2019 2222   PCO2ART 20.6 (L) 07/15/2019 2222   PO2ART 95.0 07/15/2019 2222   HCO3 12.0 (L)  07/15/2019 2222   TCO2 13 (L) 07/15/2019 2222   ACIDBASEDEF 12.0 (H) 07/15/2019 2222   O2SAT 97.0 07/15/2019 2222     Coagulation Profile: Recent Labs  Lab 07/15/19 2020 07/16/19 0526  INR 2.2* 1.3*    Cardiac Enzymes: No results for input(s): CKTOTAL, CKMB, CKMBINDEX, TROPONINI in the last 168 hours.  HbA1C: Hgb A1c MFr Bld  Date/Time Value Ref Range Status  03/26/2019 09:46 AM 5.5 4.8 - 5.6 % Final    Comment:             Prediabetes: 5.7 - 6.4          Diabetes: >6.4          Glycemic control for adults with diabetes: <7.0     CBG: Recent Labs  Lab  07/15/19 0705 07/15/19 1010 07/15/19 1355 07/15/19 1845 07/16/19 0136  GLUCAP 104* 70 87 111* 132*    Signature:    Johnsie Cancel, NP-C [facility] After hours pager: [phone]. 07/16/2019, 8:51 AM

## 2019-07-17 ENCOUNTER — Inpatient Hospital Stay (HOSPITAL_COMMUNITY): Payer: Medicaid Other

## 2019-07-17 ENCOUNTER — Other Ambulatory Visit: Payer: Medicaid Other

## 2019-07-17 ENCOUNTER — Encounter: Payer: Medicaid Other | Admitting: Obstetrics & Gynecology

## 2019-07-17 DIAGNOSIS — I361 Nonrheumatic tricuspid (valve) insufficiency: Secondary | ICD-10-CM

## 2019-07-17 LAB — BPAM FFP
Blood Product Expiration Date: 202011112359
Blood Product Expiration Date: 202011112359
Blood Product Expiration Date: 202011112359
Blood Product Expiration Date: 202011112359
Blood Product Expiration Date: 202011112359
Blood Product Expiration Date: 202011112359
Blood Product Expiration Date: 202011112359
Blood Product Expiration Date: 202011112359
Blood Product Expiration Date: 202011112359
Blood Product Expiration Date: 202011152359
ISSUE DATE / TIME: 202011102015
ISSUE DATE / TIME: 202011102015
ISSUE DATE / TIME: 202011102038
ISSUE DATE / TIME: 202011102038
ISSUE DATE / TIME: 202011102126
ISSUE DATE / TIME: 202011102126
ISSUE DATE / TIME: 202011111122
ISSUE DATE / TIME: 202011111216
ISSUE DATE / TIME: 202011111423
ISSUE DATE / TIME: 202011112141
Unit Type and Rh: 5100
Unit Type and Rh: 5100
Unit Type and Rh: 5100
Unit Type and Rh: 5100
Unit Type and Rh: 600
Unit Type and Rh: 6200
Unit Type and Rh: 6200
Unit Type and Rh: 6200
Unit Type and Rh: 6200
Unit Type and Rh: 6200

## 2019-07-17 LAB — PREPARE FRESH FROZEN PLASMA
Unit division: 0
Unit division: 0
Unit division: 0
Unit division: 0
Unit division: 0
Unit division: 0

## 2019-07-17 LAB — COMPREHENSIVE METABOLIC PANEL
ALT: 34 U/L (ref 0–44)
AST: 119 U/L — ABNORMAL HIGH (ref 15–41)
Albumin: 2.2 g/dL — ABNORMAL LOW (ref 3.5–5.0)
Alkaline Phosphatase: 72 U/L (ref 38–126)
Anion gap: 12 (ref 5–15)
BUN: 9 mg/dL (ref 6–20)
CO2: 24 mmol/L (ref 22–32)
Calcium: 8.2 mg/dL — ABNORMAL LOW (ref 8.9–10.3)
Chloride: 103 mmol/L (ref 98–111)
Creatinine, Ser: 0.8 mg/dL (ref 0.44–1.00)
GFR calc Af Amer: >60 mL/min (ref >60)
GFR calc non Af Amer: >60 mL/min (ref >60)
Glucose, Bld: 76 mg/dL (ref 70–99)
Potassium: 3.5 mmol/L (ref 3.5–5.1)
Sodium: 139 mmol/L (ref 135–145)
Total Bilirubin: 0.6 mg/dL (ref 0.3–1.2)
Total Protein: 4.8 g/dL — ABNORMAL LOW (ref 6.5–8.1)

## 2019-07-17 LAB — CBC
HCT: 29.7 % — ABNORMAL LOW (ref 36.0–46.0)
Hemoglobin: 10.3 g/dL — ABNORMAL LOW (ref 12.0–15.0)
MCH: 29 pg (ref 26.0–34.0)
MCHC: 34.7 g/dL (ref 30.0–36.0)
MCV: 83.7 fL (ref 80.0–100.0)
Platelets: 96 10*3/uL — ABNORMAL LOW (ref 150–400)
RBC: 3.55 MIL/uL — ABNORMAL LOW (ref 3.87–5.11)
RDW: 15.2 % (ref 11.5–15.5)
WBC: 16.4 10*3/uL — ABNORMAL HIGH (ref 4.0–10.5)
nRBC: 0 % (ref 0.0–0.2)

## 2019-07-17 LAB — ECHOCARDIOGRAM COMPLETE
Height: 57 in
Weight: 1708.8 oz

## 2019-07-17 LAB — SURGICAL PATHOLOGY

## 2019-07-17 MED ORDER — POTASSIUM CHLORIDE CRYS ER 20 MEQ PO TBCR
30.0000 meq | EXTENDED_RELEASE_TABLET | Freq: Two times a day (BID) | ORAL | Status: DC
  Administered 2019-07-17 (×2): 30 meq via ORAL
  Filled 2019-07-17 (×2): qty 2

## 2019-07-17 MED ORDER — FUROSEMIDE 10 MG/ML IJ SOLN
20.0000 mg | Freq: Once | INTRAMUSCULAR | Status: AC
  Administered 2019-07-17: 04:00:00 20 mg via INTRAVENOUS
  Filled 2019-07-17: qty 2

## 2019-07-17 MED ORDER — FUROSEMIDE 10 MG/ML IJ SOLN
60.0000 mg | Freq: Once | INTRAMUSCULAR | Status: AC
  Administered 2019-07-17: 08:00:00 60 mg via INTRAVENOUS
  Filled 2019-07-17: qty 6

## 2019-07-17 MED ORDER — ENOXAPARIN SODIUM 40 MG/0.4ML ~~LOC~~ SOLN
40.0000 mg | SUBCUTANEOUS | Status: DC
  Administered 2019-07-17 – 2019-07-18 (×2): 40 mg via SUBCUTANEOUS
  Filled 2019-07-17 (×2): qty 0.4

## 2019-07-17 NOTE — Plan of Care (Signed)
Continue post op plan of care 

## 2019-07-17 NOTE — Progress Notes (Signed)
Interval Antepartum Note  Admission Date: 07/14/2019 Current Date: 07/17/2019 4:28 PM  Subjective:  No chest pain, sob, cough. +PO intake and some ambulation in the halls. +flatus, no BMs. No fevers, chills, back pain. Just discomfort at her PIV sites and at the incision. Lochia appropriate  Objective:     Current Vital Signs 24h Vital Sign Ranges  T 98.2 F (36.8 C) Temp  Avg: 98.3 F (36.8 C)  Min: 97.8 F (36.6 C)  Max: 98.8 F (37.1 C)  BP 112/75 BP  Min: 98/67  Max: 123/69  HR 97 Pulse  Avg: 97.4  Min: 90  Max: 108  RR 18 Resp  Avg: 20.9  Min: 16  Max: 30  SaO2 96 % Nasal Cannula SpO2  Avg: 96.3 %  Min: 91 %  Max: 100 %       24 Hour I/O Current Shift I/O  Time Ins Outs 11/11 0701 - 11/12 0700 In: 1285.5 [P.O.:360; I.V.:170.5] Out: 5375 [Urine:5375] 11/12 0701 - 11/12 1900 In: -  Out: 4100 [Urine:4100]   Patient Vitals for the past 24 hrs:  BP Temp Temp src Pulse Resp SpO2  07/17/19 1533 112/75 98.2 F (36.8 C) Axillary 97 18 96 %  07/17/19 1443 - - - - - 97 %  07/17/19 1358 - - - - - 97 %  07/17/19 1300 - - - - - 97 %  07/17/19 1155 - - - - - 95 %  07/17/19 1145 108/69 98.6 F (37 C) Oral 95 18 98 %  07/17/19 1140 - - - - - 96 %  07/17/19 1135 - - - - - 96 %  07/17/19 1125 - - - - - 96 %  07/17/19 1120 - - - - - 97 %  07/17/19 1116 108/69 - - 94 - -  07/17/19 1110 - - - - - 98 %  07/17/19 1105 - - - - - 99 %  07/17/19 1100 - - - - - 99 %  07/17/19 1055 - - - - - 97 %  07/17/19 1050 - - - - - 98 %  07/17/19 1045 - - - - - 97 %  07/17/19 1040 - - - - - 98 %  07/17/19 1005 - - - - - 100 %  07/17/19 1000 - - - - - 100 %  07/17/19 0955 - - - - - 97 %  07/17/19 0950 - - - - - 98 %  07/17/19 0945 - - - - - 98 %  07/17/19 0940 - - - - - 98 %  07/17/19 0935 - - - - - 98 %  07/17/19 0930 - - - - - 98 %  07/17/19 0925 - - - - - 98 %  07/17/19 0920 - - - - - 97 %  07/17/19 0916 - - - - - 98 %  07/17/19 0915 - - - - - 98 %  07/17/19 0910 - - - - - 97 %   07/17/19 0905 - - - - - 97 %  07/17/19 0900 - - - - - 98 %  07/17/19 0855 - - - - - 98 %  07/17/19 0850 - - - - - 97 %  07/17/19 0845 - - - - - 98 %  07/17/19 0840 - - - - - 99 %  07/17/19 0835 - - - - - 95 %  07/17/19 0825 - - - - - 97 %  07/17/19 0820 - - - - -  96 %  07/17/19 0815 - - - - - 95 %  07/17/19 0810 - - - - - 92 %  07/17/19 0805 - - - - - 94 %  07/17/19 0800 - - - - - 95 %  07/17/19 0755 111/73 98.8 F (37.1 C) Oral 90 16 98 %  07/17/19 0750 - - - - - 97 %  07/17/19 0745 - - - - - 97 %  07/17/19 0740 - - - - - 95 %  07/17/19 0735 - - - - - 95 %  07/17/19 0730 - - - - - 97 %  07/17/19 0725 111/73 - - 92 - 93 %  07/17/19 0620 108/68 - - 98 - 92 %  07/17/19 0615 123/69 - - (!) 108 - 94 %  07/17/19 0610 - - - - - 96 %  07/17/19 0606 114/69 - - 95 - -  07/17/19 0605 - - - - - 95 %  07/17/19 0600 - - - - - 91 %  07/17/19 0555 - - - - - 95 %  07/17/19 0550 - - - - - 95 %  07/17/19 0354 98/67 98.2 F (36.8 C) Oral 100 (!) 21 92 %  07/16/19 2357 - - - - - 96 %  07/16/19 2340 115/79 97.8 F (36.6 C) Oral (!) 108 18 91 %  07/16/19 1958 101/65 98.2 F (36.8 C) Oral 100 20 94 %  07/16/19 1836 - - - - (!) 24 -  07/16/19 1755 103/77 98.2 F (36.8 C) Oral 94 (!) 30 94 %  07/16/19 1700 109/77 - - 95 (!) 23 96 %   Patient 92-94% on RA with [place] off during interview  Physical exam: General: Well nourished, well developed female in no acute distress. Abdomen: +BS, soft, nttp, mildly distended, c/d/i incision with small area of old blood at the inferior part of the honeycomb Back: no CVAT Cardiovascular: S1, S2 normal, no murmur, rub or gallop, regular rate and rhythm Respiratory: CTAB Extremities: no clubbing, cyanosis or edema Skin: Warm and dry.  GU: normal UOP in foley  Medications: Current Facility-Administered Medications  Medication Dose Route Frequency Provider Last Rate Last Dose  . coconut oil  1 application Topical PRN [place] Bing, MD      . witch  hazel-glycerin (TUCKS) pad 1 application  1 application Topical PRN [place] Bing, MD       And  . dibucaine (NUPERCAINAL) 1 % rectal ointment 1 application  1 application Rectal PRN [place] Bing, MD      . diphenhydrAMINE (BENADRYL) capsule 25 mg  25 mg Oral Q6H PRN [place] Bing, MD      . enoxaparin (LOVENOX) injection 40 mg  40 mg Subcutaneous Q24H [place] Bing, MD      . HYDROmorphone (DILAUDID) injection 1 mg  1 mg Intravenous Q3H PRN [place] Bing, MD      . menthol-cetylpyridinium (CEPACOL) lozenge 3 mg  1 lozenge Oral Q2H PRN [place] Bing, MD      . oxyCODONE (Oxy IR/ROXICODONE) immediate release tablet 5-10 mg  5-10 mg Oral Q4H PRN [place] Bing, MD   10 mg at 07/17/19 0629  . potassium chloride SA (KLOR-CON) CR tablet 30 mEq  30 mEq Oral BID  Bing, MD   30 mEq at 07/17/19 0958  . senna-docusate (Senokot-S) tablet 2 tablet  2 tablet Oral QHS PRN  Bing, MD      . simethicone (MYLICON) chewable tablet 80 mg  80 mg Oral TID  PC Aletha Halim, MD   80 mg at 07/17/19 1220    Labs:  Recent Labs  Lab 07/16/19 1301 07/16/19 1720 07/17/19 0753  WBC 16.1* 15.9* 16.4*  HGB 9.7* 9.8* 10.3*  HCT 27.7* 27.3* 29.7*  PLT 90* 90* 96*    Recent Labs  Lab 07/16/19 0526 07/16/19 1301 07/16/19 1720 07/17/19 0602  NA 135 135 139 139  K 3.4* 3.7 3.6 3.5  CL 103 105 107 103  CO2 19* 21* 21* 24  BUN 11 11 10 9   CREATININE 1.28* 0.87 0.88 0.80  CALCIUM 8.1* 7.7* 7.5* 8.2*  PROT 4.0*  --  3.8* 4.8*  BILITOT 0.7  --  0.5 0.6  ALKPHOS 51  --  48 72  ALT 20  --  23 34  AST 57*  --  78* 119*  GLUCOSE 174* 95 74 76     Radiology: echo normal  Assessment & Plan:  Pt doing well *PP: breast. Normal care. Doing well *Post op: RN to keep best of the three PIVs and d/c the other today. Also will d/c foley. Follow up void. Pt on regular diet and SLIV. No s/s of ureteral issues. Okay to d/c CT per Dr. Kennon Rounds. Repeat labs for am. Wean o2  tomorrow *PPx: lovenox *Dispo: likely Saturday 11/14  Interpreter used  [name]. MD Attending Center for [place] [employer] farm manager)

## 2019-07-17 NOTE — Progress Notes (Signed)
Subjective: Postpartum Day 2: s/p Cesarean Delivery and Hysterectomy Patient reports tolerating po well, ambulating with assistance Objective: Vital signs in last 24 hours: Temp:  [97.8 F (36.6 C)-98.7 F (37.1 C)] 98.2 F (36.8 C) (11/12 0354) Pulse Rate:  [86-108] 98 (11/12 0620) Resp:  [18-30] 21 (11/12 0354) BP: (96-123)/(65-86) 108/68 (11/12 0620) SpO2:  [91 %-97 %] 92 % (11/12 0620) Arterial Line BP: (111-133)/(62-74) 126/62 (11/11 1700)  Physical Exam:  General: alert Lochia: appropriate Heart- rrr Lungs- rales throughout Incision: no dehiscence, no significant erythema, dressing with old blood changed for a new one DVT Evaluation: No evidence of DVT seen on physical exam. O2 sats in the high 80s Potassium normal this morning  Recent Labs    07/16/19 1301 07/16/19 1720  HGB 9.7* 9.8*  HCT 27.7* 27.3*    Assessment/Plan: Status post Cesarean section/Hysterectomy. Doing well postoperatively.  Probable CHF- treat with more lasix.  Tiffany [name] 07/17/2019, 7:24 AM

## 2019-07-17 NOTE — Progress Notes (Signed)
RN notified Dr Hulan Fray of low oxygen saturation of 87% on 2L [place]. RN was instructed to give 20mg  of Lasix IV push and to have CT follow up with a chest xray. Patient was notified via [place] Interpreter that a chest x-ray was going to be performed and 20mg  of Lasix was given.

## 2019-07-17 NOTE — Progress Notes (Signed)
  Echocardiogram 2D Echocardiogram has been performed.  Tiffany [name] 07/17/2019, 10:06 AM

## 2019-07-17 NOTE — Progress Notes (Signed)
Pt educated with use of interpreter on baby safety and about not sleeping with baby in the bed.

## 2019-07-18 LAB — TYPE AND SCREEN
ABO/RH(D): O POS
Antibody Screen: NEGATIVE
Unit division: 0
Unit division: 0
Unit division: 0
Unit division: 0
Unit division: 0
Unit division: 0
Unit division: 0
Unit division: 0
Unit division: 0
Unit division: 0
Unit division: 0
Unit division: 0

## 2019-07-18 LAB — BPAM RBC
Blood Product Expiration Date: 202012152359
Blood Product Expiration Date: 202012152359
Blood Product Expiration Date: 202012152359
Blood Product Expiration Date: 202012152359
Blood Product Expiration Date: 202012152359
Blood Product Expiration Date: 202012162359
Blood Product Expiration Date: 202012162359
Blood Product Expiration Date: 202012162359
Blood Product Expiration Date: 202012162359
Blood Product Expiration Date: 202012162359
Blood Product Expiration Date: 202012162359
Blood Product Expiration Date: 202012162359
ISSUE DATE / TIME: 202011101956
ISSUE DATE / TIME: 202011101956
ISSUE DATE / TIME: 202011102033
ISSUE DATE / TIME: 202011102033
ISSUE DATE / TIME: 202011102038
ISSUE DATE / TIME: 202011102038
ISSUE DATE / TIME: 202011102124
ISSUE DATE / TIME: 202011102124
ISSUE DATE / TIME: 202011110144
Unit Type and Rh: 5100
Unit Type and Rh: 5100
Unit Type and Rh: 5100
Unit Type and Rh: 5100
Unit Type and Rh: 5100
Unit Type and Rh: 5100
Unit Type and Rh: 5100
Unit Type and Rh: 5100
Unit Type and Rh: 5100
Unit Type and Rh: 5100
Unit Type and Rh: 5100
Unit Type and Rh: 5100

## 2019-07-18 LAB — COMPREHENSIVE METABOLIC PANEL
ALT: 33 U/L (ref 0–44)
AST: 75 U/L — ABNORMAL HIGH (ref 15–41)
Albumin: 2 g/dL — ABNORMAL LOW (ref 3.5–5.0)
Alkaline Phosphatase: 72 U/L (ref 38–126)
Anion gap: 13 (ref 5–15)
BUN: 6 mg/dL (ref 6–20)
CO2: 24 mmol/L (ref 22–32)
Calcium: 8.3 mg/dL — ABNORMAL LOW (ref 8.9–10.3)
Chloride: 103 mmol/L (ref 98–111)
Creatinine, Ser: 0.66 mg/dL (ref 0.44–1.00)
GFR calc Af Amer: >60 mL/min (ref >60)
GFR calc non Af Amer: >60 mL/min (ref >60)
Glucose, Bld: 78 mg/dL (ref 70–99)
Potassium: 4 mmol/L (ref 3.5–5.1)
Sodium: 140 mmol/L (ref 135–145)
Total Bilirubin: 0.6 mg/dL (ref 0.3–1.2)
Total Protein: 4.7 g/dL — ABNORMAL LOW (ref 6.5–8.1)

## 2019-07-18 LAB — CBC
HCT: 28.3 % — ABNORMAL LOW (ref 36.0–46.0)
Hemoglobin: 9.7 g/dL — ABNORMAL LOW (ref 12.0–15.0)
MCH: 29 pg (ref 26.0–34.0)
MCHC: 34.3 g/dL (ref 30.0–36.0)
MCV: 84.7 fL (ref 80.0–100.0)
Platelets: 120 10*3/uL — ABNORMAL LOW (ref 150–400)
RBC: 3.34 MIL/uL — ABNORMAL LOW (ref 3.87–5.11)
RDW: 14.7 % (ref 11.5–15.5)
WBC: 13.4 10*3/uL — ABNORMAL HIGH (ref 4.0–10.5)
nRBC: 0.1 % (ref 0.0–0.2)

## 2019-07-18 LAB — MAGNESIUM: Magnesium: 1.6 mg/dL — ABNORMAL LOW (ref 1.7–2.4)

## 2019-07-18 LAB — LACTIC ACID, PLASMA: Lactic Acid, Venous: 1.3 mmol/L (ref 0.5–1.9)

## 2019-07-18 MED ORDER — SODIUM CHLORIDE 0.9 % IV SOLN: INTRAVENOUS | Status: DC | PRN

## 2019-07-18 MED ORDER — POLYETHYLENE GLYCOL 3350 17 G PO PACK
17.0000 g | PACK | Freq: Every day | ORAL | Status: DC
  Administered 2019-07-18 – 2019-07-19 (×2): 17 g via ORAL
  Filled 2019-07-18 (×2): qty 1

## 2019-07-18 MED ORDER — MAGNESIUM SULFATE 2 GM/50ML IV SOLN
2.0000 g | Freq: Once | INTRAVENOUS | Status: AC
  Administered 2019-07-18: 09:00:00 2 g via INTRAVENOUS
  Filled 2019-07-18: qty 50

## 2019-07-18 MED ORDER — POTASSIUM CHLORIDE CRYS ER 20 MEQ PO TBCR
30.0000 meq | EXTENDED_RELEASE_TABLET | Freq: Two times a day (BID) | ORAL | Status: AC
  Administered 2019-07-18: 10:00:00 30 meq via ORAL
  Filled 2019-07-18: qty 2

## 2019-07-18 MED ORDER — POLYSACCHARIDE IRON COMPLEX 150 MG PO CAPS
150.0000 mg | ORAL_CAPSULE | Freq: Every day | ORAL | Status: DC
  Administered 2019-07-18 – 2019-07-19 (×2): 150 mg via ORAL
  Filled 2019-07-18 (×2): qty 1

## 2019-07-18 NOTE — Progress Notes (Signed)
Daily Antepartum Note  Admission Date: 07/14/2019 Current Date: 07/18/2019 10:49 AM  Tiffany [name] is a 38 y.o. [id number] POD#3 s/p pLTCS (arrest of dilation, chorio) with subsequent hysterectomy due to atony, DIC @ [redacted]w[redacted]d. Patient admitted for IOL for GDMa2.  Pregnancy complicated by: Patient Active Problem List   Diagnosis Date Noted  . Acute kidney injury (HCC) 07/16/2019  . Hemorrhage   . S/P emergency cesarean hysterectomy 07/15/2019  . DIC (disseminated intravascular coagulation) (HCC) 07/15/2019  . OB Hemorrhage 07/15/2019  . Chorioamnionitis 07/15/2019  . Gestational diabetes mellitus (GDM), antepartum 07/14/2019  . [redacted] weeks gestation of pregnancy 07/14/2019  . Proteinuria in pregnancy, antepartum 03/31/2019  . Supervision of high risk pregnancy, antepartum 03/26/2019  . AMA (advanced maternal age) multigravida 35+ 03/26/2019  . Elevated AFP 03/26/2019  . Anemia in pregnancy 03/26/2019  . GDM (gestational diabetes mellitus) 03/26/2019  . Language barrier 03/26/2019  . COVID-19 virus infection 01/15/2019  . Uterine mass 05/15/2014    Overnight/24hr events:  None  Subjective:  Continues to meet post op goals. Has some subjective sob at times. No other s/s.   Objective:    Current Vital Signs 24h Vital Sign Ranges  T 98.2 F (36.8 C) Temp  Avg: 98.2 F (36.8 C)  Min: 98 F (36.7 C)  Max: 98.6 F (37 C)  BP 109/82 BP  Min: 108/69  Max: 116/71  HR 86 Pulse  Avg: 90.9  Min: 81  Max: 97  RR 17 Resp  Avg: 17.7  Min: 17  Max: 18  SaO2 99 % Nasal Cannula SpO2  Avg: 97.4 %  Min: 95 %  Max: 99 %       24 Hour I/O Current Shift I/O  Time Ins Outs 11/12 0701 - 11/13 0700 In: 0  Out: 5875 [Urine:5875] No intake/output data recorded.   Patient Vitals for the past 24 hrs:  BP Temp Temp src Pulse Resp SpO2  07/18/19 0736 109/82 98.2 F (36.8 C) Oral 86 17 99 %  07/18/19 0315 111/80 98 F (36.7 C) Oral 81 17 98 %  07/17/19 2323 110/81 98.1 F (36.7 C) Oral 91 18 99 %   07/17/19 1950 - - - - - 98 %  07/17/19 1919 116/71 98 F (36.7 C) Oral 92 18 99 %  07/17/19 1632 - - - - - 96 %  07/17/19 1533 112/75 98.2 F (36.8 C) Axillary 97 18 96 %  07/17/19 1443 - - - - - 97 %  07/17/19 1358 - - - - - 97 %  07/17/19 1300 - - - - - 97 %  07/17/19 1155 - - - - - 95 %  07/17/19 1145 108/69 98.6 F (37 C) Oral 95 18 98 %  07/17/19 1140 - - - - - 96 %  07/17/19 1135 - - - - - 96 %  07/17/19 1125 - - - - - 96 %  07/17/19 1120 - - - - - 97 %  07/17/19 1116 108/69 - - 94 - -  07/17/19 1110 - - - - - 98 %  07/17/19 1105 - - - - - 99 %  07/17/19 1100 - - - - - 99 %  07/17/19 1055 - - - - - 97 %  07/17/19 1050 - - - - - 98 %    Physical exam: General: Well nourished, well developed female in no acute distress. Abdomen: +BS, soft, nttp, unchanged dressing. Mildly distended Cardiovascular: S1, S2 normal, no murmur, rub or  gallop, regular rate and rhythm Respiratory: CTAB Extremities: no clubbing, cyanosis or edema Skin: Warm and dry.   Medications: Current Facility-Administered Medications  Medication Dose Route Frequency Provider Last Rate Last Dose  . 0.9 %  sodium chloride infusion   Intravenous PRN Aletha Halim, MD      . coconut oil  1 application Topical PRN Aletha Halim, MD      . witch hazel-glycerin (TUCKS) pad 1 application  1 application Topical PRN Aletha Halim, MD       And  . dibucaine (NUPERCAINAL) 1 % rectal ointment 1 application  1 application Rectal PRN Aletha Halim, MD      . diphenhydrAMINE (BENADRYL) capsule 25 mg  25 mg Oral Q6H PRN Aletha Halim, MD      . enoxaparin (LOVENOX) injection 40 mg  40 mg Subcutaneous Q24H Aletha Halim, MD   40 mg at 07/17/19 2219  . HYDROmorphone (DILAUDID) injection 1 mg  1 mg Intravenous Q3H PRN Aletha Halim, MD      . iron polysaccharides (NIFEREX) capsule 150 mg  150 mg Oral Daily Aletha Halim, MD   150 mg at 07/18/19 1014  . menthol-cetylpyridinium (CEPACOL) lozenge 3 mg   1 lozenge Oral Q2H PRN Aletha Halim, MD      . oxyCODONE (Oxy IR/ROXICODONE) immediate release tablet 5-10 mg  5-10 mg Oral Q4H PRN Aletha Halim, MD   10 mg at 07/18/19 0408  . polyethylene glycol (MIRALAX / GLYCOLAX) packet 17 g  17 g Oral Daily Aletha Halim, MD   17 g at 07/18/19 1013  . senna-docusate (Senokot-S) tablet 2 tablet  2 tablet Oral QHS PRN Aletha Halim, MD      . simethicone (MYLICON) chewable tablet 80 mg  80 mg Oral TID Ria Clock, MD   80 mg at 07/18/19 1014    Labs:  Recent Labs  Lab 07/16/19 1720 07/17/19 0753 07/18/19 0538  WBC 15.9* 16.4* 13.4*  HGB 9.8* 10.3* 9.7*  HCT 27.3* 29.7* 28.3*  PLT 90* 96* 120*    Recent Labs  Lab 07/16/19 1720 07/17/19 0602 07/18/19 0538  NA 139 139 140  K 3.6 3.5 4.0  CL 107 103 103  CO2 21* 24 24  BUN 10 9 6   CREATININE 0.88 0.80 0.66  CALCIUM 7.5* 8.2* 8.3*  PROT 3.8* 4.8* 4.7*  BILITOT 0.5 0.6 0.6  ALKPHOS 48 72 72  ALT 23 34 33  AST 78* 119* 75*  GLUCOSE 74 76 78   Lactic acid: 1.3 Mg: 1.6  Radiology: none  Assessment & Plan:  Pt doing well *PP: routine care *Post op: doing well. Will do walk test with patient in halls. Patient stated above 95 on RA during interview *PPx: lovenox *FEN/GI: regular diet. IV mg today *Dispo: likely tomorrow  [name]. MD [facility] (Faculty Practice)   Update: pt ambulated with tech in halls and 97% or greater on RA and 100% on RA. RN to make sure patient uses incentive spirometry  [name] MD [facility] (Faculty Practice) 07/18/2019 Time: 1125am

## 2019-07-18 NOTE — Lactation Note (Signed)
This note was copied from a baby's chart. Lactation Consultation Note:.  Santiago Glad Physicist, medical for teaching. [place] ID # [id number].  Mother is a P4. She reports breastfeeding all other children.  Infant has been exclusively bottle feeding formula. Mother reports that she doesn't have milk yet. Assist mother with hand expression. Observed large drops of colostrum. Both mother and father happy to see colostrum.  Mother was given a harmony hand pump and assist with pumping for several mins. Mother observed colostrum flow.   Discussed possible decreased milk supply due to her  [place].  Suggested that mother pump to protect her supply. Mother declines using a DEBP. Mother reports that she has not used the pump since delivery day.  Mother reports that she is active with [facility]. Mother wants to breastfeed and get formula from [facility]. She expresses concern that she may not make enough milk for this infant.   Mother reports that she has attempt several times to latch infant and she says it is painful. Discussed importance of infant latching with wide open mouth. Offered assistance with latching infant . Infant sleeping after feeding in crib.   Encouraged mother to hand express and to use hand pump for 15 mins on each breast. Mothers breast are becoming full.   Encouraged mother to do STS with infant.  She reports that she is very tired and wants to keep resting . She will breastfeed her baby when she gets milk.  Mother receptive to plan of using the hand pump.   Mother reports that she has a sister that is taking care of her other children and her husband for support.   Mother is aware of [place] services at [facility] and community support.     Patient Name: Tiffany [name] [id number] Date: 07/18/2019 Reason for consult: Initial assessment   Maternal Data    Feeding Feeding Type: Bottle Fed - Formula  LATCH Score                   Interventions Interventions: Hand express  Lactation  Tools Discussed/Used WIC Program: Yes   Consult Status Consult Status: Follow-up Date: 07/19/19 Follow-up type: In-patient    Jess Barters [facility] 07/18/2019, 2:48 PM

## 2019-07-18 NOTE — Progress Notes (Signed)
Provided to patient and instructed on how to use incentive spirometer. Patient declined need for interpreter during education. Patient demonstrated understanding. Encouraged patient to use  5-10 times per hour while awake. Patient verbalized understanding. Maxwell Caul, Leretha Dykes [place]

## 2019-07-18 NOTE — Progress Notes (Signed)
NT ambulated patient in hallway on room air. Patient kept oxygen saturations up 97-98% and was at 100% during routing vital sign check. Notified Dr. Ilda Basset, who ordered to have patient increase use of incentive spirometer and to discontinue oxygen. Tiffany [name], Leretha Dykes [place]

## 2019-07-19 DIAGNOSIS — Z22322 Carrier or suspected carrier of Methicillin resistant Staphylococcus aureus: Secondary | ICD-10-CM

## 2019-07-19 MED ORDER — FERROUS SULFATE 325 (65 FE) MG PO TABS: 325.0000 mg | ORAL_TABLET | Freq: Every day | ORAL | 0 refills | Status: DC

## 2019-07-19 MED ORDER — OXYCODONE HCL 5 MG PO TABS: 5.0000 mg | ORAL_TABLET | Freq: Four times a day (QID) | ORAL | 0 refills | Status: DC | PRN

## 2019-07-19 MED ORDER — SIMETHICONE 80 MG PO CHEW: 80.0000 mg | CHEWABLE_TABLET | Freq: Four times a day (QID) | ORAL | 0 refills | Status: DC | PRN

## 2019-07-19 MED ORDER — POLYETHYLENE GLYCOL 3350 17 G PO PACK: 17.0000 g | PACK | Freq: Every day | ORAL | 1 refills | Status: AC

## 2019-07-19 NOTE — Progress Notes (Signed)
Discharge instructions and prescriptions given to pt. Discussed post-op care, signs and symptoms to report to the MD, upcoming appointments, and meds. [place] interpreter was used. Pt verbalizes understanding and has no questions or concerns at this time. Pt discharged with baby in stable condition.

## 2019-07-19 NOTE — Discharge Instructions (Signed)
°Cesarean Delivery, Care After °Refer to this sheet in the next few weeks. These instructions provide you with information on caring for yourself after your procedure. Your health care provider may also give you specific instructions. Your treatment has been planned according to current medical practices, but problems sometimes occur. Call your health care provider if you have any problems or questions after you go home. °HOME CARE INSTRUCTIONS  °· Only take over-the-counter or prescription medications as directed by your health care provider. °· Do not drink alcohol, especially if you are breastfeeding or taking medication to relieve pain. °· Do not  smoke tobacco. °· Continue to use good perineal care. Good perineal care includes: °¨ Wiping your perineum from front to back. °¨ Keeping your perineum clean. °· Check your surgical cut (incision) daily for increased redness, drainage, swelling, or separation of skin. °· Shower and clean your incision gently with soap and water every day, by letting warm and soapy water run over the incision, and then pat it dry. If your health care provider says it is okay, leave the incision uncovered. Use a bandage (dressing) if the incision is draining fluid or appears irritated. If the adhesive strips across the incision do not fall off within 7 days, carefully peel them off, after a shower. °· Hug a pillow when coughing or sneezing until your incision is healed. This helps to relieve pain. °· Do not use tampons, douches or have sexual intercourse, until your health care provider says it is okay. °· Wear a well-fitting bra that provides breast support. °· Limit wearing support panties or control-top hose. °· Drink enough fluids to keep your urine clear or pale yellow. °· Eat high-fiber foods such as whole grain cereals and breads, brown rice, beans, and fresh fruits and vegetables every day. These foods may help prevent or relieve constipation. °· Resume activities such as  climbing stairs, driving, lifting, exercising, or traveling as directed by your health care provider. °· Try to have someone help you with your household activities and your newborn for at least a few days after you leave the hospital. °· Rest as much as possible. Try to rest or take a nap when your newborn is sleeping. °· Increase your activities gradually. °· Do not lift more than 15lbs until directed by a provider. °· Keep all of your scheduled postpartum appointments. It is very important to keep your scheduled follow-up appointments. At these appointments, your health care provider will be checking to make sure that you are healing physically and emotionally. °SEEK MEDICAL CARE IF:  °· You are passing large clots from your vagina. Save any clots to show your health care provider. °· You have a foul smelling discharge from your vagina. °· You have trouble urinating. °· You are urinating frequently. °· You have pain when you urinate. °· You have a change in your bowel movements. °· You have increasing redness, pain, or swelling near your incision. °· You have pus draining from your incision. °· Your incision is separating. °· You have painful, hard, or reddened breasts. °· You have a severe headache. °· You have blurred vision or see spots. °· You feel sad or depressed. °· You have thoughts of hurting yourself or your newborn. °· You have questions about your care, the care of your newborn, or medications. °· You are dizzy or light-headed. °· You have a rash. °· You have pain, redness, or swelling at the site of the removed intravenous access (IV) tube. °· You have nausea   or vomiting. °· You stopped breastfeeding and have not had a menstrual period within 12 weeks of stopping. °· You are not breastfeeding and have not had a menstrual period within 12 weeks of delivery. °· You have a fever. °SEEK IMMEDIATE MEDICAL CARE IF: °· You have persistent pain. °· You have chest pain. °· You have shortness of breath. °· You  faint. °· You have leg pain. °· You have stomach pain. °· Your vaginal bleeding saturates 2 or more sanitary pads in 1 hour. °MAKE SURE YOU:  °· Understand these instructions. °· Will watch your condition. °· Will get help right away if you are not doing well or get worse. °Document Released: 05/13/2002 Document Revised: 01/05/2014 Document Reviewed: 04/17/2012 °ExitCare® Patient Information ©2015 [employer]. This information is not intended to replace advice given to you by your health care provider. Make sure you discuss any questions you have with your health care provider. ° ° °

## 2019-07-21 LAB — CULTURE, BLOOD (ROUTINE X 2)
Culture: NO GROWTH
Culture: NO GROWTH
Special Requests: ADEQUATE
Special Requests: ADEQUATE

## 2019-07-25 ENCOUNTER — Ambulatory Visit (INDEPENDENT_AMBULATORY_CARE_PROVIDER_SITE_OTHER): Payer: Medicaid Other

## 2019-07-25 ENCOUNTER — Other Ambulatory Visit: Payer: Self-pay

## 2019-07-25 VITALS — BP 109/73 | HR 101 | Wt 85.8 lb

## 2019-07-25 DIAGNOSIS — Z5189 Encounter for other specified aftercare: Secondary | ICD-10-CM

## 2019-07-25 MED ORDER — SIMETHICONE 80 MG PO CHEW: 80.0000 mg | CHEWABLE_TABLET | Freq: Four times a day (QID) | ORAL | 0 refills | Status: DC | PRN

## 2019-07-25 MED ORDER — IBUPROFEN 600 MG PO TABS: 600.0000 mg | ORAL_TABLET | Freq: Four times a day (QID) | ORAL | 0 refills | Status: DC | PRN

## 2019-07-25 NOTE — Progress Notes (Signed)
[place] Interperter # [id number] Pt here today for incision check s/p c-section on 07/15/19.  Pt reports that she is having pain at the incision that she takes Oxycodone for and is effective.  Observed incision- well approximated, no odor, no drainage, no erythema.  I advised pt to take the ibuprofen as that will help with the inflammation.  Pt requests a refill on her simethicone for gas relief.  Dr. Ilda Basset approved refill on Simethicone and Ibuprofen.  I advised pt to continue to monitor for infection and to f/u at her pp appt scheduled on 08/14/19.  Pt verbalized understanding.

## 2019-07-29 NOTE — Progress Notes (Signed)
Patient seen and assessed by nursing staff during this encounter. I have reviewed the chart and agree with the documentation and plan.  Amberlin Utke, MD 07/29/2019 12:18 AM    

## 2019-07-30 ENCOUNTER — Ambulatory Visit: Payer: Medicaid Other

## 2019-08-14 ENCOUNTER — Encounter: Payer: Self-pay | Admitting: Obstetrics and Gynecology

## 2019-08-14 ENCOUNTER — Ambulatory Visit (INDEPENDENT_AMBULATORY_CARE_PROVIDER_SITE_OTHER): Payer: Medicaid Other | Admitting: Obstetrics and Gynecology

## 2019-08-14 ENCOUNTER — Other Ambulatory Visit: Payer: Self-pay

## 2019-08-14 ENCOUNTER — Other Ambulatory Visit (HOSPITAL_COMMUNITY)
Admission: RE | Admit: 2019-08-14 | Discharge: 2019-08-14 | Disposition: A | Discharge status: Home / Self Care | Payer: Medicaid Other | Source: Ambulatory Visit | Attending: Obstetrics and Gynecology | Admitting: Obstetrics and Gynecology

## 2019-08-14 DIAGNOSIS — Z90710 Acquired absence of both cervix and uterus: Secondary | ICD-10-CM

## 2019-08-14 DIAGNOSIS — Z1389 Encounter for screening for other disorder: Secondary | ICD-10-CM | POA: Diagnosis not present

## 2019-08-14 NOTE — Patient Instructions (Signed)
Hysterectomy Information  A hysterectomy is a surgery to remove your uterus. After surgery, you will no longer have periods. Also, you will no longer be able to get pregnant. Reasons for this surgery You may have this surgery if:  You have bleeding in your vagina: ? That is not normal. ? That does not stop, or that keeps coming back.  You have long-term (chronic) pain in your lower belly (pelvic area).  The lining of your uterus grows outside of the uterus (endometriosis).  The lining of your uterus grows in the muscle of the uterus (adenomyosis).  Your uterus falls down into your vagina (prolapse).  You have a growth in your uterus that causes problems (uterine fibroids).  You have cells that could turn into cancer (precancerous cells).  You have cancer of the uterus or cervix. Types of hysterectomies There are 3 types of hysterectomies. Depending on the type, the surgery will:  Remove the top part of the uterus (supracervical).  Remove the uterus and the cervix (total).  Remove the uterus, cervix, and tissue that holds the uterus in place (radical). Ways a hysterectomy can be done This surgery may be done in one of these ways:  A cut (incision) is made in the belly (abdomen). The uterus is taken out through the cut.  A cut is made in the vagina. The uterus is taken out through the cut.  Three or four cuts are made in the belly. A device with a camera is put through one of the cuts. The uterus is cut into pieces and taken out through the cuts or the vagina.  Three or four cuts are made in the belly. A device with a camera is put through one of the cuts. The uterus is taken out through the vagina.  Three or four cuts are made in the belly. A computer helps control the surgical tools. The uterus is cut into small pieces. The pieces are taken out through the cuts or through the vagina. Talk with your doctor about which way is best for you. Risks of hysterectomy Generally,  this surgery is safe. However, problems can happen, including:  Bleeding.  Needing donated blood (transfusion).  Blood clots.  Infection.  Damage to other structures or organs.  Allergic reactions.  Needing to switch to a different type of surgery. What to expect after surgery  You will be given pain medicine.  You will need to stay in the hospital for 1-2 days.  Follow your doctor's instructions about: ? Exercising. ? Driving. ? What activities are safe for you.  You will need to have someone with you at home for 3-5 days.  You will need to see your doctor after 2-4 weeks.  You may get hot flashes, have night sweats, and have trouble sleeping.  You may need to have Pap tests if your surgery was related to cancer. Talk with your doctor about how often you need Pap tests. Questions to ask your doctor  Do I need this surgery? Do I have other treatment options?  What are my options for this surgery?  What needs to be removed?  What are the risks?  What are the benefits?  How long will I need to stay in the hospital?  How long will I need to recover?  What symptoms can I expect after the procedure? Summary  A hysterectomy is a surgery to remove your uterus. After surgery, you will no longer have periods. Also, you will no longer be able to   get pregnant.  Talk with your doctor about which type of hysterectomy is best for you. This information is not intended to replace advice given to you by your health care provider. Make sure you discuss any questions you have with your health care provider. Document Released: 11/13/2011 Document Revised: 10/24/2018 Document Reviewed: 11/21/2016 Elsevier Patient Education  2020 Elsevier Inc.  

## 2019-08-14 NOTE — Progress Notes (Signed)
Subjective:     Tiffany [name] is a 38 y.o. female who presents for a postpartum visit. She is 4 weeks postpartum following a Cesarean Section. I have fully reviewed the prenatal and intrapartum course. The delivery was at 39.1 gestational weeks. Outcome: primary cesarean section, low transverse incision. Anesthesia: spinal. Postpartum course has been unremarkable. Baby's course has been unremarkable. Baby is feeding by both breast and bottle - Similac Sensitive RS and Similac with Iron. Bleeding no bleeding. Bowel function is normal. Bladder function is normal. Patient is not sexually active. Contraception method is status post hysterectomy. Postpartum depression screening: Negative  The following portions of the patient's history were reviewed and updated as appropriate: allergies, current medications, past family history, past medical history, past social history, past surgical history and problem list.  Review of Systems Pertinent items are noted in HPI.   Objective:    BP 113/81   Pulse 82   Wt 89 lb 4.8 oz (40.5 kg)   BMI 19.32 kg/m   General:  alert  Lungs: clear to auscultation bilaterally  Heart:  regular rate and rhythm, S1, S2 normal, no murmur, click, rub or gallop  Abdomen: soft, non-tender; bowel sounds normal; no masses,  no organomegaly   Vulva:  normal  Vagina: normal vagina  Cervix:  multiparous appearance, pap collected.   Corpus: normal  Adnexa:  normal adnexa  Rectal Exam: Not performed.        Assessment:   Normal postpartum exam. Pap smear done at today's visit. Status post supracervical hysterectomy.  Per OP note: The uterus was amputated, then removal of a bit more cervix done under the hysterotomy. The cervix closed with running suture in several layers. Cervix intact.   Plan:   1. Contraception: status post hysterectomy 2. Pap pending  3. Follow up: ASAP for 2 hour PP GTT   Rasch, Artist Pais, NP 08/14/2019 1:26 PM

## 2019-08-18 LAB — CYTOLOGY - PAP
Comment: NEGATIVE
Diagnosis: NEGATIVE
High risk HPV: NEGATIVE

## 2019-08-26 ENCOUNTER — Other Ambulatory Visit: Payer: Self-pay | Admitting: *Deleted

## 2019-08-26 DIAGNOSIS — O24429 Gestational diabetes mellitus in childbirth, unspecified control: Secondary | ICD-10-CM

## 2019-08-27 ENCOUNTER — Other Ambulatory Visit: Payer: Self-pay

## 2019-08-27 ENCOUNTER — Other Ambulatory Visit: Payer: Medicaid Other

## 2019-08-27 DIAGNOSIS — O24429 Gestational diabetes mellitus in childbirth, unspecified control: Secondary | ICD-10-CM

## 2019-09-01 ENCOUNTER — Other Ambulatory Visit: Payer: Self-pay | Admitting: *Deleted

## 2019-09-01 DIAGNOSIS — O24429 Gestational diabetes mellitus in childbirth, unspecified control: Secondary | ICD-10-CM

## 2019-09-02 ENCOUNTER — Other Ambulatory Visit: Payer: Medicaid Other

## 2019-09-02 ENCOUNTER — Other Ambulatory Visit: Payer: Self-pay

## 2019-09-02 DIAGNOSIS — O24429 Gestational diabetes mellitus in childbirth, unspecified control: Secondary | ICD-10-CM

## 2019-09-03 LAB — GLUCOSE TOLERANCE, 2 HOURS
Glucose, 2 hour: 116 mg/dL (ref 65–139)
Glucose, GTT - Fasting: 78 mg/dL (ref 65–99)

## 2020-03-04 DIAGNOSIS — H538 Other visual disturbances: Secondary | ICD-10-CM | POA: Diagnosis not present

## 2020-03-05 DIAGNOSIS — H5213 Myopia, bilateral: Secondary | ICD-10-CM | POA: Diagnosis not present

## 2020-03-25 DIAGNOSIS — H5213 Myopia, bilateral: Secondary | ICD-10-CM | POA: Diagnosis not present

## 2020-05-01 ENCOUNTER — Emergency Department (HOSPITAL_COMMUNITY)
Admission: EM | Admit: 2020-05-01 | Discharge: 2020-05-01 | Disposition: A | Discharge status: Home / Self Care | Payer: No Typology Code available for payment source | Attending: Emergency Medicine | Admitting: Emergency Medicine

## 2020-05-01 ENCOUNTER — Encounter (HOSPITAL_COMMUNITY): Payer: Self-pay | Admitting: Emergency Medicine

## 2020-05-01 ENCOUNTER — Emergency Department (HOSPITAL_COMMUNITY): Payer: No Typology Code available for payment source

## 2020-05-01 ENCOUNTER — Other Ambulatory Visit: Payer: Self-pay

## 2020-05-01 DIAGNOSIS — Y999 Unspecified external cause status: Secondary | ICD-10-CM | POA: Diagnosis not present

## 2020-05-01 DIAGNOSIS — Y929 Unspecified place or not applicable: Secondary | ICD-10-CM | POA: Insufficient documentation

## 2020-05-01 DIAGNOSIS — R072 Precordial pain: Secondary | ICD-10-CM | POA: Diagnosis not present

## 2020-05-01 DIAGNOSIS — Y9389 Activity, other specified: Secondary | ICD-10-CM | POA: Insufficient documentation

## 2020-05-01 DIAGNOSIS — Z8616 Personal history of COVID-19: Secondary | ICD-10-CM | POA: Diagnosis not present

## 2020-05-01 DIAGNOSIS — R079 Chest pain, unspecified: Secondary | ICD-10-CM | POA: Diagnosis not present

## 2020-05-01 DIAGNOSIS — R0789 Other chest pain: Secondary | ICD-10-CM | POA: Diagnosis not present
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
## 2020-05-04 ENCOUNTER — Telehealth: Payer: Self-pay | Admitting: *Deleted

## 2020-05-04 NOTE — Telephone Encounter (Signed)
Contacted patient to complete Transition of Care assessment. She requested to be called back on 05/05/20. Informed patient that I will call her back at 0900 05/05/20. The patient requests an interpreter, and states she speaks "Clydie Braun" confirms. Will attempt to contact patient at that time.    Transition Care Management Unsuccessful Follow-up Telephone Call  Date of discharge and from where:  05/01/20, [facility]  Attempts:  1st Attempt  Reason for unsuccessful TCM follow-up call:  Patient requests to be called back.   Burnard Bunting, RN, BSN, CCRN Patient Engagement Center [phone]

## 2020-05-05 ENCOUNTER — Telehealth: Payer: Self-pay | Admitting: *Deleted

## 2020-05-05 NOTE — Telephone Encounter (Signed)
Assisted by Marcelino Freestone, Interpreter # [phone]. The patient speaks "Clydie Braun". Contacted patient to complete Transition of Care Assessment: Transition Care Management Follow-up Telephone Call  Date of discharge and from where: 05/01/20, [facility]  How have you been since you were released from the hospital? "still having left arm pain, and bruises"  Any questions or concerns? Yes, "feels that nerve or something vibrates, I feel dizzy. Patient encouraged to proceed to ED/UC. She verbalized understanding.  Items Reviewed:  Did the pt receive and understand the discharge instructions provided? Yes   Medications obtained and verified? No   Any new allergies since your discharge? No   Dietary orders reviewed? NO   Do you have support at home? Yes , family  Functional Questionnaire: (I = Independent and D = Dependent) ADLs: I  Bathing/Dressing- I  Meal Prep- I  Eating- I  Maintaining continence- I  Transferring/Ambulation- I  Managing Meds- I  Follow up appointments reviewed:   PCP Hospital f/u appt confirmed? No The patient would like to be assigned a PCP   Specialist Hospital f/u appt confirmed? No    Are transportation arrangements needed? No   If their condition worsens, is the pt aware to call PCP or go to the Emergency Dept.? YES  Was the patient provided with contact information for the PCP's office or ED? YES  Was to pt encouraged to call back with questions or concerns? Yes Order placed for [facility] due to language barrier.

## 2020-05-05 NOTE — Telephone Encounter (Signed)
Email sent to [facility] to request follow up for NP appointment.  Elizabelle Fite     PEC [phone] 

## 2020-05-12 ENCOUNTER — Ambulatory Visit: Payer: Medicaid Other | Admitting: Nurse Practitioner

## 2020-05-12 VITALS — BP 104/68 | HR 96 | Temp 97.5°F | Ht <= 58 in | Wt 100.0 lb

## 2020-05-12 DIAGNOSIS — S161XXA Strain of muscle, fascia and tendon at neck level, initial encounter: Secondary | ICD-10-CM | POA: Diagnosis not present

## 2020-05-12 DIAGNOSIS — O99013 Anemia complicating pregnancy, third trimester: Secondary | ICD-10-CM

## 2020-05-12 MED ORDER — PREPLUS 27-1 MG PO TABS
1.0000 | ORAL_TABLET | Freq: Every day | ORAL | 3 refills | Status: DC
Start: 2020-05-12 — End: 2021-02-23

## 2020-05-12 MED ORDER — FERROUS SULFATE 325 (65 FE) MG PO TABS: 325.0000 mg | ORAL_TABLET | Freq: Every day | ORAL | 0 refills | Status: DC
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
## 2020-06-02 ENCOUNTER — Other Ambulatory Visit: Payer: Self-pay

## 2020-06-02 ENCOUNTER — Encounter: Payer: Self-pay | Admitting: Nurse Practitioner

## 2020-06-02 ENCOUNTER — Ambulatory Visit (INDEPENDENT_AMBULATORY_CARE_PROVIDER_SITE_OTHER): Payer: Medicaid Other | Admitting: Nurse Practitioner

## 2020-06-02 VITALS — BP 119/81 | HR 88 | Temp 98.4°F | Resp 20 | Ht <= 58 in | Wt 102.0 lb

## 2020-06-02 DIAGNOSIS — Z23 Encounter for immunization: Secondary | ICD-10-CM

## 2020-06-02 DIAGNOSIS — R7303 Prediabetes: Secondary | ICD-10-CM | POA: Diagnosis not present

## 2020-06-02 DIAGNOSIS — G4700 Insomnia, unspecified: Secondary | ICD-10-CM

## 2020-06-02 DIAGNOSIS — R519 Headache, unspecified: Secondary | ICD-10-CM | POA: Diagnosis not present

## 2020-06-02 MED ORDER — SIMETHICONE 80 MG PO CHEW: 80.0000 mg | CHEWABLE_TABLET | Freq: Four times a day (QID) | ORAL | 2 refills | Status: DC | PRN

## 2020-06-02 NOTE — Patient Instructions (Addendum)
Form - Headache Record There are many types and causes of headaches. A headache record can help guide your treatment plan. Use this form to record the details. Bring this form with you to your follow-up visits. Follow your health care provider's instructions on how to describe your headache. You may be asked to:  Use a pain scale. This is a tool to rate the intensity of your headache using words or numbers.  Describe what your headache feels like, such as dull, achy, throbbing, or sharp. Headache record Date: _______________ Time (from start to end): ____________________ Location of the headache: _________________________  Intensity of the headache: ____________________ Description of the headache: ______________________________________________________________  Hours of sleep the night before the headache: __________  Food or drinks before the headache started: ______________________________________________________________________________________  Events before the headache started: _______________________________________________________________________________________________  Symptoms before the headache started: __________________________________________________________________________________________  Symptoms during the headache: __________________________________________________________________________________________________  Treatment: ________________________________________________________________________________________________________________  Effect of treatment: _________________________________________________________________________________________________________  Other comments: ___________________________________________________________________________________________________________ Date: _______________ Time (from start to end): ____________________ Location of the headache: _________________________  Intensity of the headache: ____________________ Description of the  headache: ______________________________________________________________  Hours of sleep the night before the headache: __________  Food or drinks before the headache started: ______________________________________________________________________________________  Events before the headache started: ____________________________________________________________________________________________  Symptoms before the headache started: _________________________________________________________________________________________  Symptoms during the headache: _______________________________________________________________________________________________  Treatment: ________________________________________________________________________________________________________________  Effect of treatment: _________________________________________________________________________________________________________  Other comments: ___________________________________________________________________________________________________________ Date: _______________ Time (from start to end): ____________________ Location of the headache: _________________________  Intensity of the headache: ____________________ Description of the headache: ______________________________________________________________  Hours of sleep the night before the headache: __________  Food or drinks before the headache started: ______________________________________________________________________________________  Events before the headache started: ____________________________________________________________________________________________  Symptoms before the headache started: _________________________________________________________________________________________  Symptoms during the headache: _______________________________________________________________________________________________  Treatment:  ________________________________________________________________________________________________________________  Effect of treatment: _________________________________________________________________________________________________________  Other comments: ___________________________________________________________________________________________________________ Date: _______________ Time (from start to end): ____________________ Location of the headache: _________________________  Intensity of the headache: ____________________ Description of the headache: ______________________________________________________________  Hours of sleep the night before the headache: _________  Food or drinks before the headache started: ______________________________________________________________________________________  Events before the headache started: ____________________________________________________________________________________________  Symptoms before the headache started: _________________________________________________________________________________________  Symptoms during the headache: _______________________________________________________________________________________________  Treatment: ________________________________________________________________________________________________________________  Effect of treatment: _________________________________________________________________________________________________________  Other comments: ___________________________________________________________________________________________________________ Date: _______________ Time (from start to end): ____________________ Location of the headache: _________________________  Intensity of the headache: ____________________ Description of the headache: ______________________________________________________________  Hours of sleep the night before the headache: _________  Food or drinks  before the headache started: ______________________________________________________________________________________  Events before the headache started: ____________________________________________________________________________________________  Symptoms before the headache started: _________________________________________________________________________________________  Symptoms during the headache: _______________________________________________________________________________________________  Treatment: ________________________________________________________________________________________________________________  Effect of treatment: _________________________________________________________________________________________________________  Other comments: ___________________________________________________________________________________________________________ This information is not intended to replace advice given to you by your health care provider. Make sure you discuss any questions you have with your health care provider. Document Revised: 09/09/2018 Document Reviewed: 09/09/2018 Elsevier Patient Education  2020 [employer]  Prediabetes Eating Plan Prediabetes is a condition that causes blood sugar (glucose) levels to be higher than normal. This increases the risk for developing diabetes. In order to prevent diabetes from developing, your health care provider may recommend a diet and other lifestyle changes to help you:  Control your blood glucose levels.  Improve your cholesterol levels.  Manage your blood pressure. Your health care provider may recommend working with a diet and nutrition specialist (dietitian) to make a meal plan that is best for you. What are  tips for following this plan? Lifestyle  Set weight loss goals with the help of your health care team. It is recommended that most people with prediabetes lose 7% of their current body weight.  Exercise for at  least 30 minutes at least 5 days a week.  Attend a support group or seek ongoing support from a mental health counselor.  Take over-the-counter and prescription medicines only as told by your health care provider. Reading food labels  Read food labels to check the amount of fat, salt (sodium), and sugar in prepackaged foods. Avoid foods that have: ? Saturated fats. ? Trans fats. ? Added sugars.  Avoid foods that have more than 300 milligrams (mg) of sodium per serving. Limit your daily sodium intake to less than 2,300 mg each day. Shopping  Avoid buying pre-made and processed foods. Cooking  Cook with olive oil. Do not use butter, lard, or ghee.  Bake, broil, grill, or boil foods. Avoid frying. Meal planning   Work with your dietitian to develop an eating plan that is right for you. This may include: ? Tracking how many calories you take in. Use a food diary, notebook, or mobile application to track what you eat at each meal. ? Using the glycemic index (GI) to plan your meals. The index tells you how quickly a food will raise your blood glucose. Choose low-GI foods. These foods take a longer time to raise blood glucose.  Consider following a Mediterranean diet. This diet includes: ? Several servings each day of fresh fruits and vegetables. ? Eating fish at least twice a week. ? Several servings each day of whole grains, beans, nuts, and seeds. ? Using olive oil instead of other fats. ? Moderate alcohol consumption. ? Eating small amounts of red meat and whole-fat dairy.  If you have high blood pressure, you may need to limit your sodium intake or follow a diet such as the DASH eating plan. DASH is an eating plan that aims to lower high blood pressure. What foods are recommended? The items listed below may not be a complete list. Talk with your dietitian about what dietary choices are best for you. Grains Whole grains, such as whole-wheat or whole-grain breads, crackers,  cereals, and pasta. Unsweetened oatmeal. Bulgur. Barley. Quinoa. Brown rice. Corn or whole-wheat flour tortillas or taco shells. Vegetables Lettuce. Spinach. Peas. Beets. Cauliflower. Cabbage. Broccoli. Carrots. Tomatoes. Squash. Eggplant. Herbs. Peppers. Onions. Cucumbers. Brussels sprouts. Fruits Berries. Bananas. Apples. Oranges. Grapes. Papaya. Mango. Pomegranate. Kiwi. Grapefruit. Cherries. Meats and other protein foods Seafood. Poultry without skin. Lean cuts of pork and beef. Tofu. Eggs. Nuts. Beans. Dairy Low-fat or fat-free dairy products, such as yogurt, cottage cheese, and cheese. Beverages Water. Tea. Coffee. Sugar-free or diet soda. Seltzer water. Lowfat or no-fat milk. Milk alternatives, such as soy or almond milk. Fats and oils Olive oil. Canola oil. Sunflower oil. Grapeseed oil. Avocado. Walnuts. Sweets and desserts Sugar-free or low-fat pudding. Sugar-free or low-fat ice cream and other frozen treats. Seasoning and other foods Herbs. Sodium-free spices. Mustard. Relish. Low-fat, low-sugar ketchup. Low-fat, low-sugar barbecue sauce. Low-fat or fat-free mayonnaise. What foods are not recommended? The items listed below may not be a complete list. Talk with your dietitian about what dietary choices are best for you. Grains Refined white flour and flour products, such as bread, pasta, snack foods, and cereals. Vegetables Canned vegetables. Frozen vegetables with butter or cream sauce. Fruits Fruits canned with syrup. Meats and other protein foods Fatty cuts of meat. Poultry with  skin. Breaded or fried meat. Processed meats. Dairy Full-fat yogurt, cheese, or milk. Beverages Sweetened drinks, such as sweet iced tea and soda. Fats and oils Butter. Lard. Ghee. Sweets and desserts Baked goods, such as cake, cupcakes, pastries, cookies, and cheesecake. Seasoning and other foods Spice mixes with added salt. Ketchup. Barbecue sauce. Mayonnaise. Summary  To prevent diabetes  from developing, you may need to make diet and other lifestyle changes to help control blood sugar, improve cholesterol levels, and manage your blood pressure.  Set weight loss goals with the help of your health care team. It is recommended that most people with prediabetes lose 7 percent of their current body weight.  Consider following a Mediterranean diet that includes plenty of fresh fruits and vegetables, whole grains, beans, nuts, seeds, fish, lean meat, low-fat dairy, and healthy oils. This information is not intended to replace advice given to you by your health care provider. Make sure you discuss any questions you have with your health care provider. Document Revised: 12/13/2018 Document Reviewed: 10/25/2016 Elsevier Patient Education  2020 [employer].
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
## 2020-06-03 ENCOUNTER — Encounter: Payer: Self-pay | Admitting: Nurse Practitioner

## 2020-06-03 LAB — POCT GLYCOSYLATED HEMOGLOBIN (HGB A1C)
HbA1c POC (<> result, manual entry): 5.9 % (ref 4.0–5.6)
HbA1c, POC (controlled diabetic range): 5.9 % (ref 0.0–7.0)
HbA1c, POC (prediabetic range): 5.9 % (ref 5.7–6.4)
Hemoglobin A1C: 5.9 % — AB (ref 4.0–5.6)

## 2020-06-03 LAB — MICROALBUMIN, URINE: Microalbumin, Urine: <3 ug/mL

## 2020-06-08 IMAGING — US US MFM FETAL BPP W/O NON-STRESS
1 series · 14 of 28 positions shown · non-contrast
Comparison: none

[Series 1: us mfm fetal bpp w/o non-stress · 41 acquisitions, 14 frames shown]
[im 2/41]
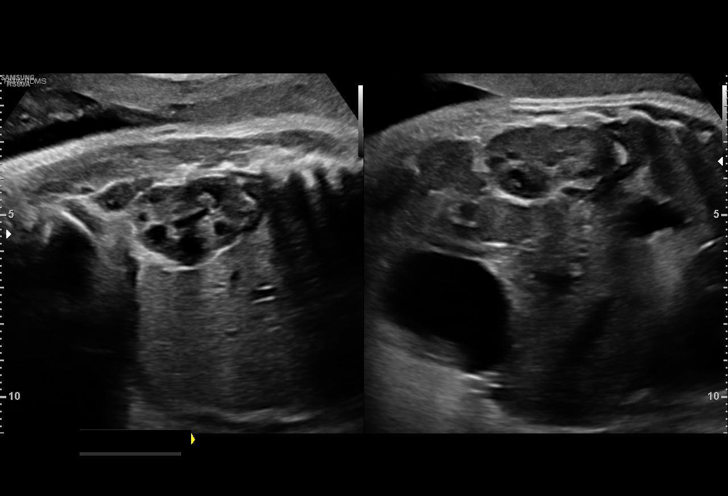
[im 5/41]
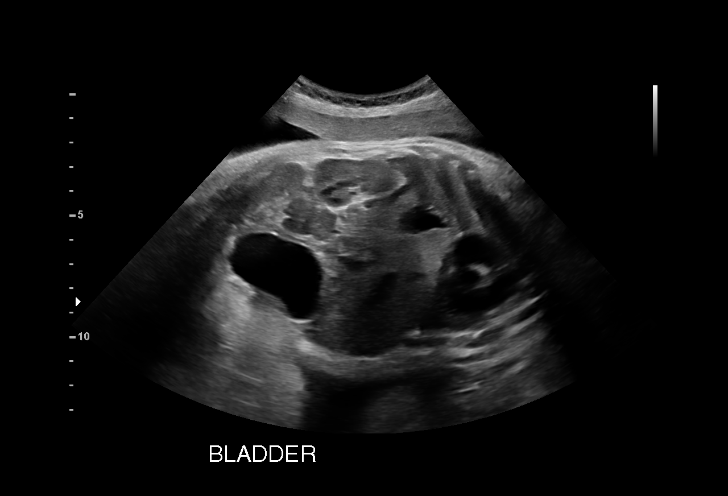
[im 8/41]
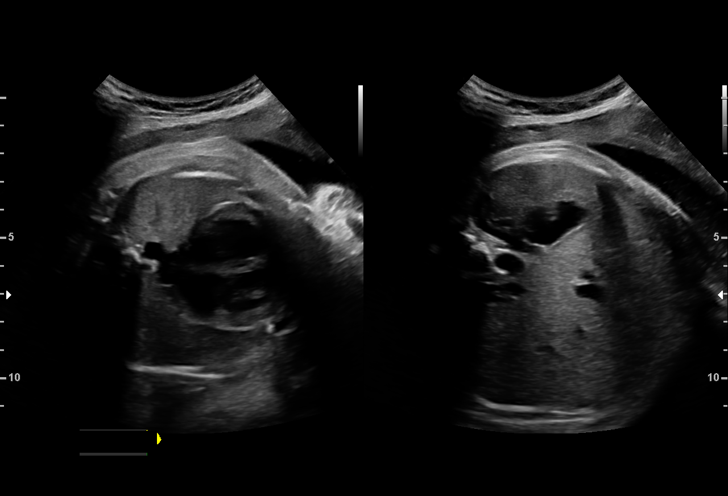
[im 11/41]
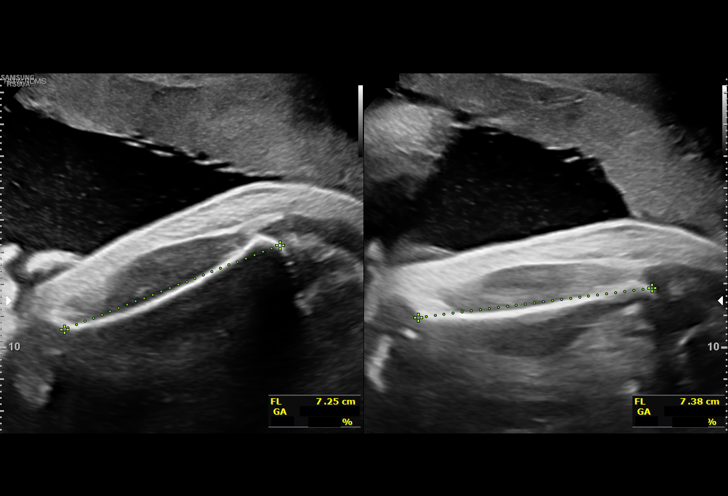
[im 14/41]
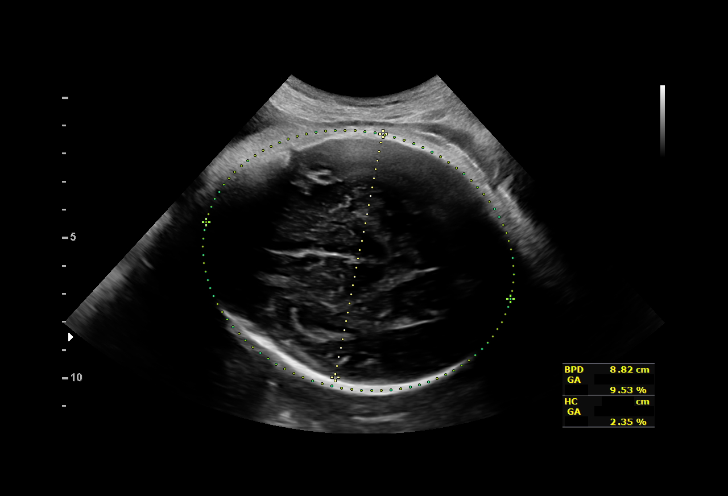
[im 17/41]
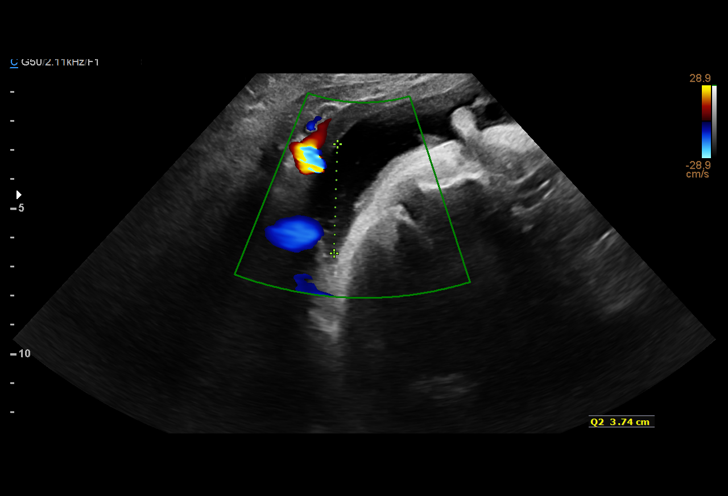
[im 20/41]
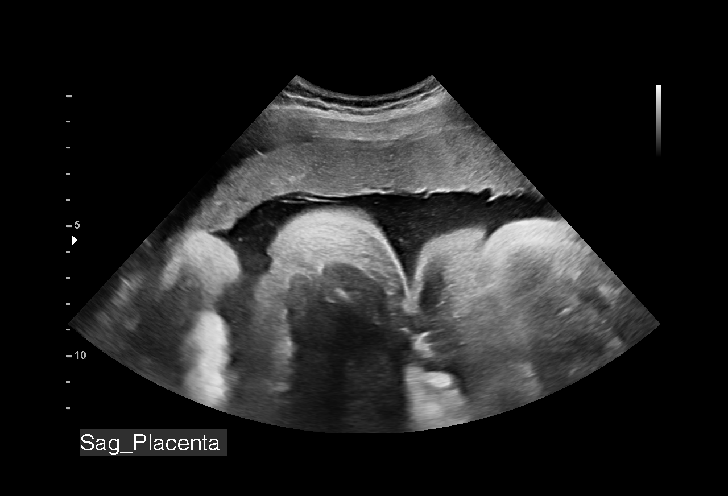
[im 23/41]
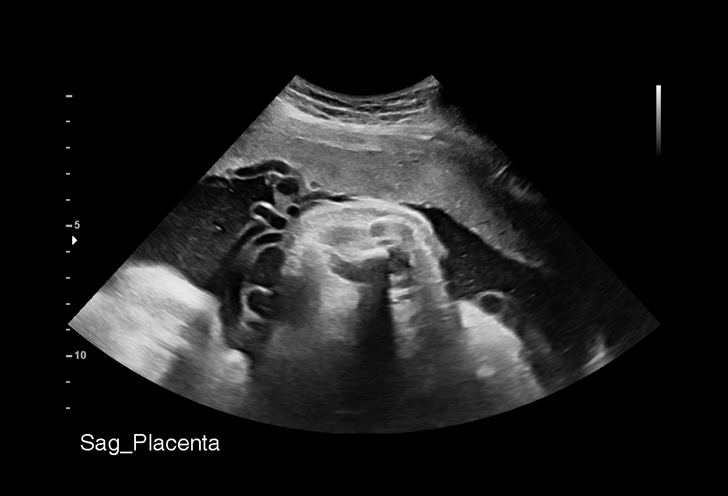
[im 26/41]
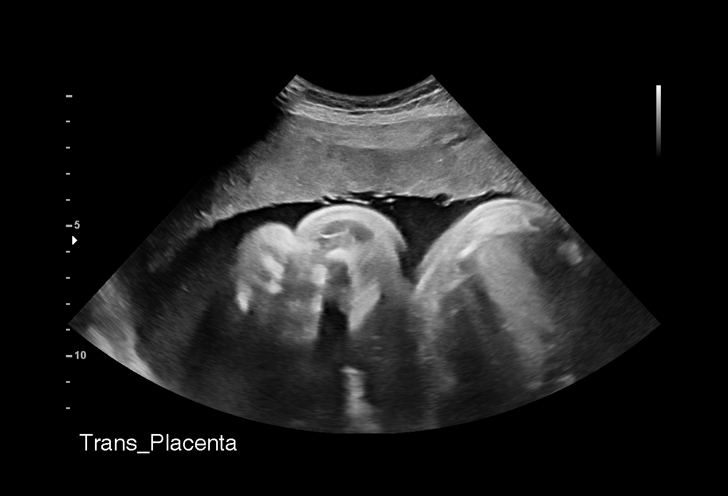
[im 29/41]
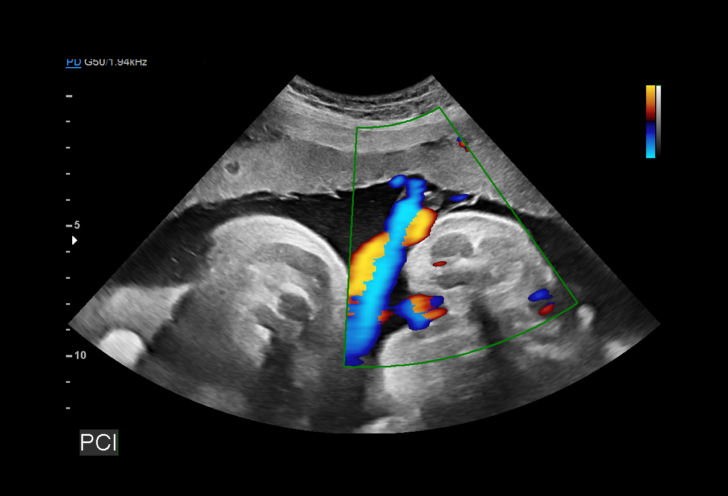
[im 32/41]
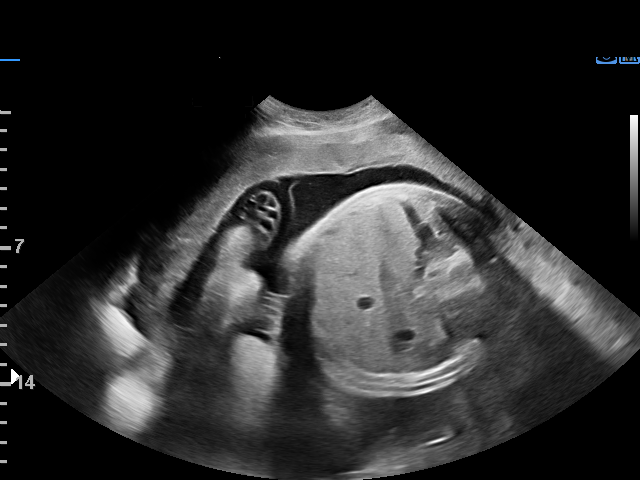
[im 35/41]
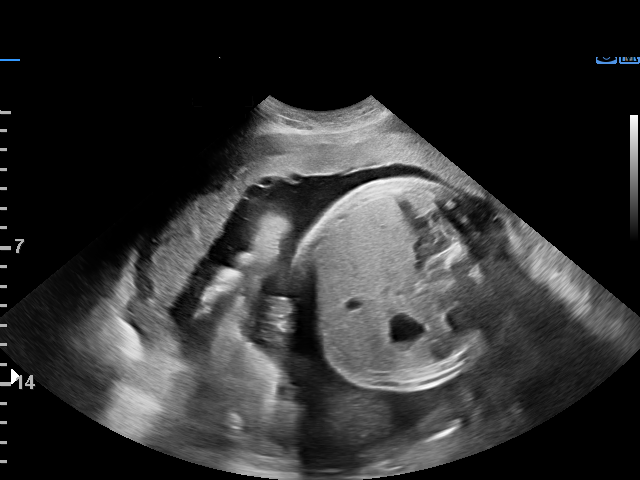
[im 38/41]
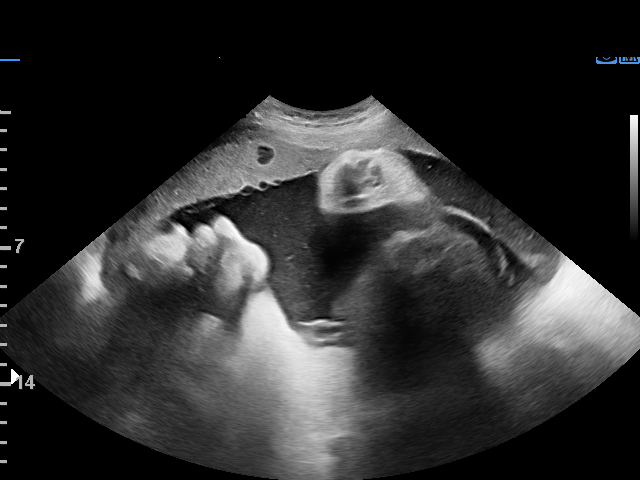
[im 41/41]
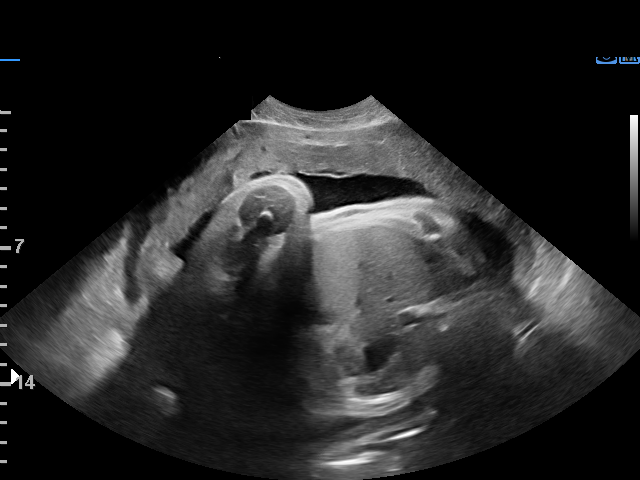

[14 of 28 positions shown; findings below may reference images not displayed]

[address]

 ----------------------------------------------------------------------

 ----------------------------------------------------------------------
Indications

  Gestational diabetes in pregnancy,
  controlled by oral hypoglycemic drugs
  Advanced maternal age multigravida 35+,
  third trimester
  38 weeks gestation of pregnancy
  Encounter for other specified antenatal
  screening
 ----------------------------------------------------------------------
Fetal Evaluation

 Num Of Fetuses:         1
 Fetal Heart Rate(bpm):  151
 Cardiac Activity:       Observed
 Presentation:           Cephalic
 Placenta:               Anterior
 P. Cord Insertion:      Visualized, central

 Amniotic Fluid
 AFI FV:      Within normal limits

 AFI Sum(cm)     %Tile       Largest Pocket(cm)
 19.79           80

 RUQ(cm)       RLQ(cm)       LUQ(cm)        LLQ(cm)

Biophysical Evaluation

 Amniotic F.V:   Within normal limits       F. Tone:        Observed
 F. Movement:    Observed                   Score:          [DATE]
 F. Breathing:   Observed
Biometry

 BPD:      88.9  mm     G. Age:  36w 0d         14  %    CI:        76.59   %    70 - 86
                                                         FL/HC:      22.7   %    20.9 -
 HC:      321.8  mm     G. Age:  36w 2d          3  %    HC/AC:      0.97        0.92 -
 AC:      332.7  mm     G. Age:  37w 1d         34  %    FL/BPD:     82.2   %    71 - 87
 FL:       73.1  mm     G. Age:  37w 3d         29  %    FL/AC:      22.0   %    20 - 24
 HUM:      59.9  mm     G. Age:  34w 6d          5  %

 Est. FW:    9109  gm    6 lb 13 oz      29  %
OB History

 Gravidity:    4         Term:   3        Prem:   0        SAB:   0
 TOP:          0       Ectopic:  0        Living: 3
Gestational Age

 LMP:           34w 6d        Date:  11/08/18                 EDD:   08/15/19
 U/S Today:     36w 5d                                        EDD:   08/02/19
 Best:          38w 3d     Det. By:  U/S  (04/08/19)          EDD:   07/21/19
Anatomy

 Cranium:               Appears normal         Aortic Arch:            Previously seen
 Cavum:                 Previously seen        Ductal Arch:            Previously seen
 Ventricles:            Previously seen        Diaphragm:              Appears normal
 Choroid Plexus:        Previously seen        Stomach:                Appears normal, left
                                                                       sided
 Cerebellum:            Previously seen        Abdomen:                Previously seen
 Posterior Fossa:       Previously seen        Abdominal Wall:         Previously seen
 Nuchal Fold:           Previously seen        Cord Vessels:           Previously seen
 Face:                  Orbits and profile     Kidneys:                Appear normal
                        previously seen
 Lips:                  Previously seen        Bladder:                Appears normal
 Thoracic:              Appears normal         Spine:                  Previously seen
 Heart:                 Previously seen        Upper Extremities:      Previously seen
 RVOT:                  Previously seen        Lower Extremities:      Previously seen
 LVOT:                  Previously seen

 Other:  Fetus appears to be female. Heels and nasal bone previously
         visualized.
Cervix Uterus Adnexa

 Cervix
 Not visualized (advanced GA >91wks)

 Uterus
 No abnormality visualized.

 Left Ovary
 No adnexal mass visualized.

 Right Ovary
 No adnexal mass visualized.

 Cul De Sac
 No free fluid seen.
 Adnexa
 No abnormality visualized.
Comments

 This patient was seen for a follow up growth scan due to
 gestational diabetes that is currently controlled with oral
 medication.  Her fingersticks have mostly been within normal
 limits.  She denies any problems since her last exam.
 She was informed that the fetal growth and amniotic fluid
 level appears appropriate for her gestational age.
 A biophysical profile performed today was [DATE].
 The patient is already scheduled for delivery in 4 days.

## 2020-06-15 IMAGING — DX DG CHEST 2V
2 series · 2 of 2 positions shown · non-contrast
Comparison: 04/23/2014.

CLINICAL DATA: Progressive shortness of breath.

EXAM:
CHEST - 2 VIEW

[chest lat]
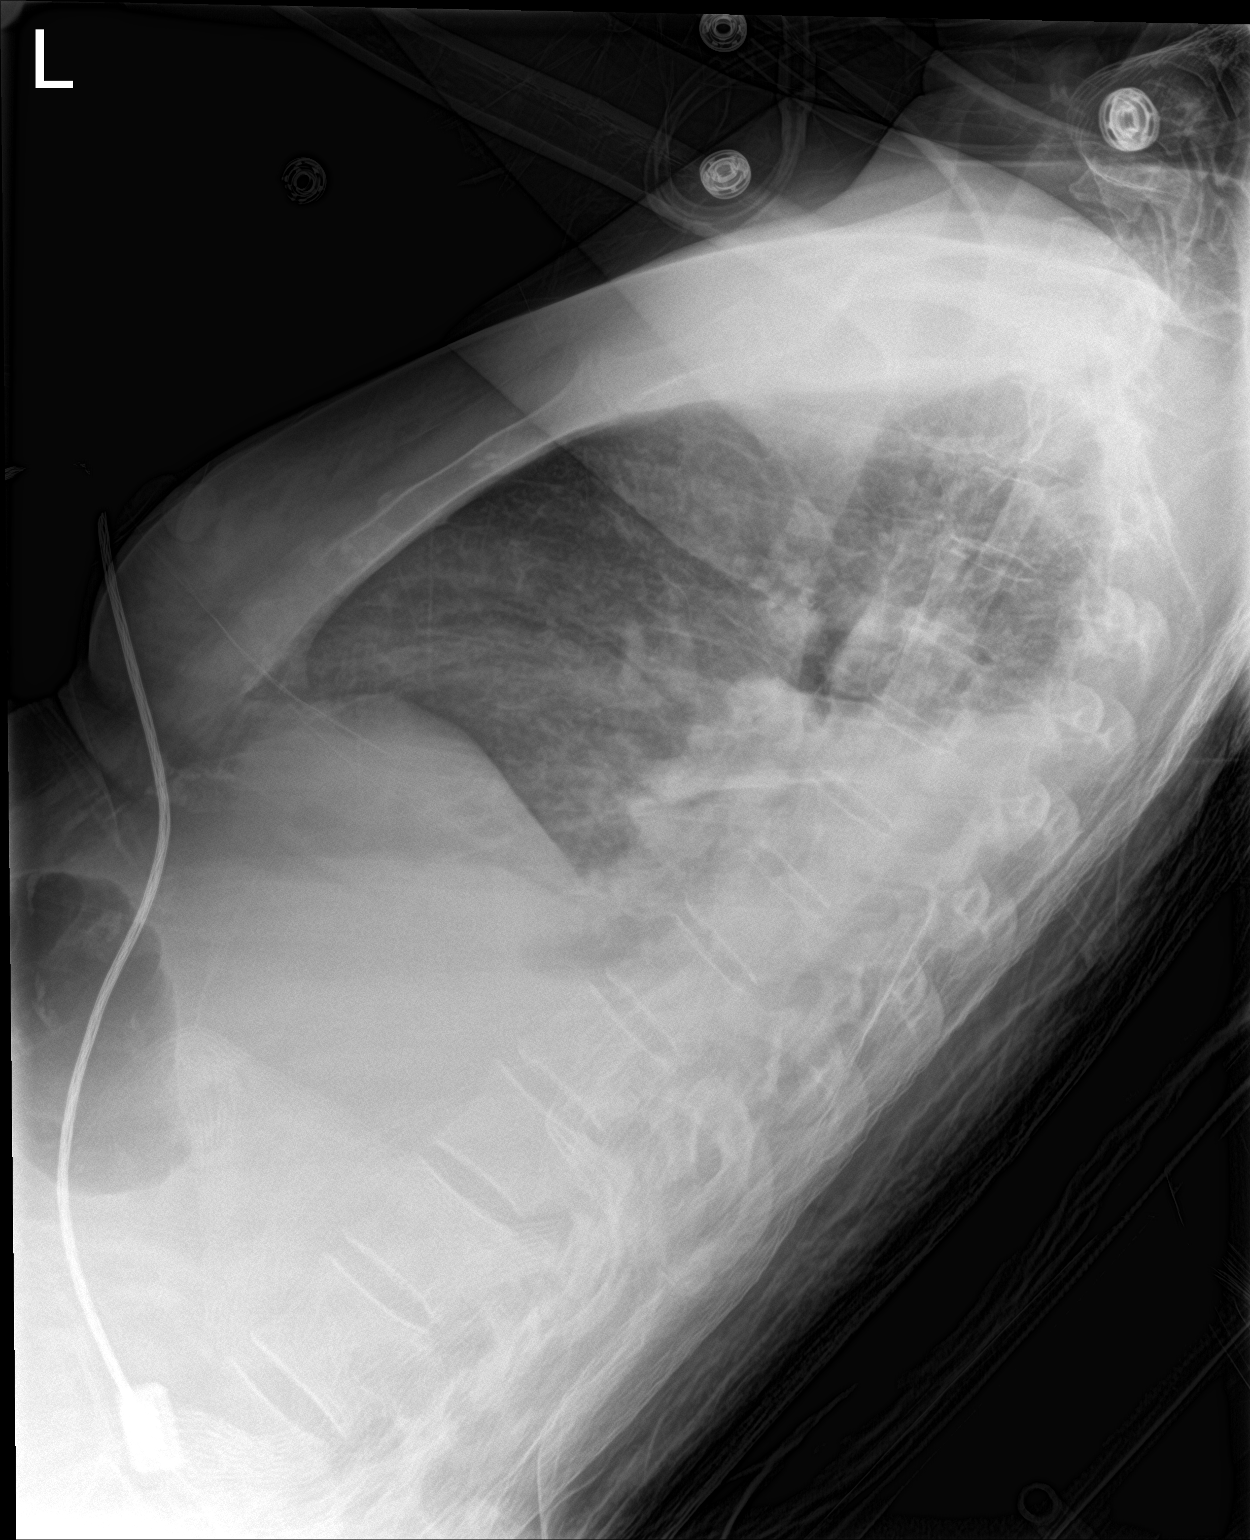

[chest ap]
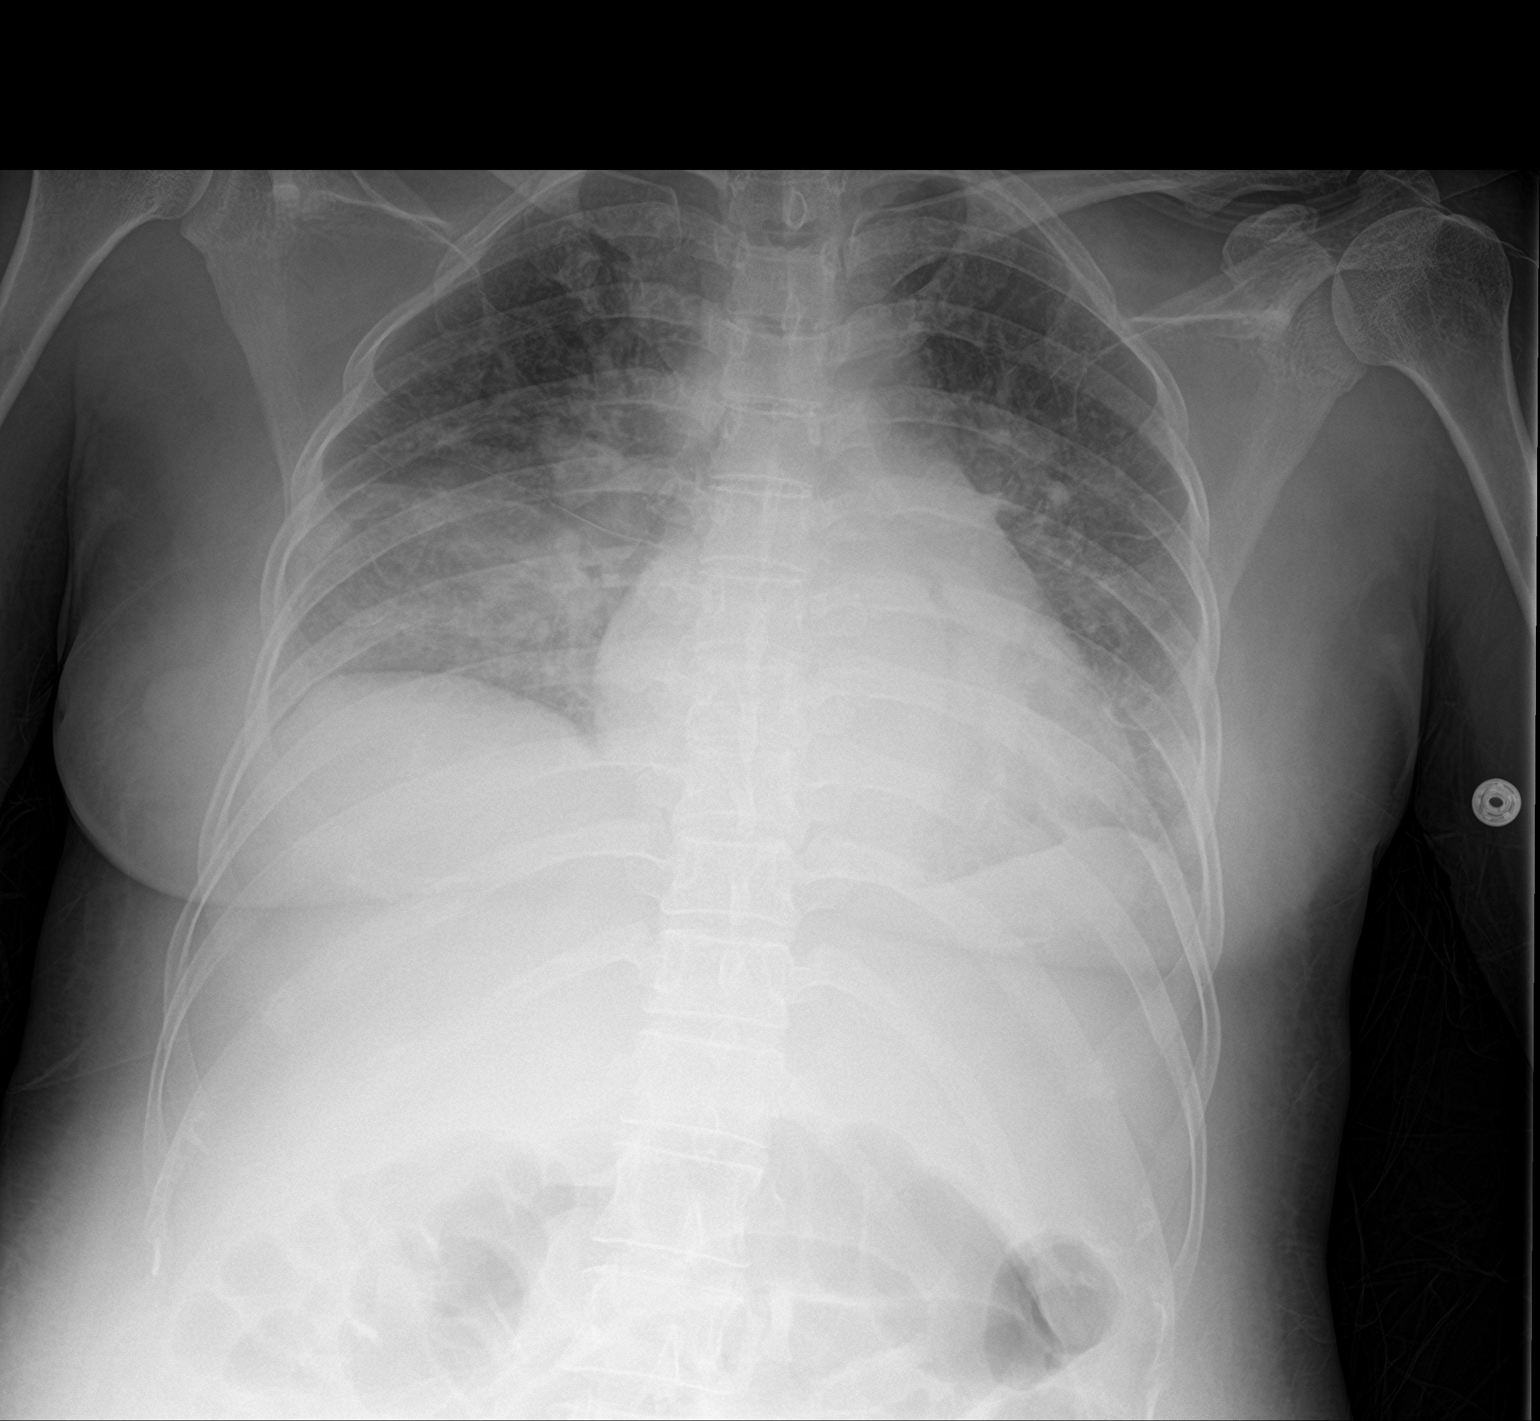

[2 of 2 positions shown; findings below may reference images not displayed]

FINDINGS: Heart is enlarged. Diffuse interstitial and airspace disease is
present. Bilateral effusions are noted. The visualized soft tissues
and bony thorax are unremarkable.
IMPRESSION: 1. Cardiomegaly with interstitial edema and effusions suggestive of
congestive heart failure.
2. Interstitial and airspace disease is somewhat asymmetric,
infection is not excluded.

## 2020-09-01 ENCOUNTER — Ambulatory Visit: Payer: Self-pay | Admitting: Nurse Practitioner

## 2020-09-13 ENCOUNTER — Telehealth (INDEPENDENT_AMBULATORY_CARE_PROVIDER_SITE_OTHER): Payer: Medicaid Other | Admitting: Nurse Practitioner

## 2020-09-13 ENCOUNTER — Other Ambulatory Visit: Payer: Self-pay

## 2020-09-13 ENCOUNTER — Encounter: Payer: Self-pay | Admitting: Nurse Practitioner

## 2020-09-13 DIAGNOSIS — G4700 Insomnia, unspecified: Secondary | ICD-10-CM | POA: Diagnosis not present

## 2020-09-13 DIAGNOSIS — R519 Headache, unspecified: Secondary | ICD-10-CM | POA: Diagnosis not present

## 2020-09-13 DIAGNOSIS — R7303 Prediabetes: Secondary | ICD-10-CM | POA: Diagnosis not present

## 2020-09-13 NOTE — Progress Notes (Signed)
   [facility] Patient [facility] [address] [place], [place]  [address] Phone:  [phone]   Fax:  [phone]  Virtual Visit via Telephone Note  I connected with Tiffany [name] on 09/13/20 at  2:40 PM EST by telephone and verified that I am speaking with the correct person using two identifiers.   I discussed the limitations, risks, security and privacy concerns of performing an evaluation and management service by telephone and the availability of in person appointments. I also discussed with the patient that there may be a patient responsible charge related to this service. The patient expressed understanding and agreed to proceed.  Patient home Provider Office  History of Present Illness: She came to the office for a 3 month follow up, however her daughter tested positive for COVID. She was not aware that she should not be exposing others. She admits that her daughter still has a fever.  She is feeling better. She had COVID-19 symptoms on last week. She feel like her insomnia has improved. She had headache on last week. She admits she ate some foot and the headache got better.  She has not been monitoring her CBG; previous CBG 5.9%   Observations/Objective: No exam due to virtual visit  Assessment and Plan: Assessment  Primary Diagnosis & Pertinent Problem List: The primary encounter diagnosis was Prediabetes. Diagnoses of Insomnia, unspecified type and Nonintractable headache, unspecified chronicity pattern, unspecified headache type were also pertinent to this visit.  Visit Diagnosis: 1. Prediabetes  Encouraged to continue with healthy diet We will reevaluate CBG and A1c in approximately 6 weeks  2. Insomnia, unspecified type  Improving encourage patient continue with home remedies  3. Nonintractable headache, unspecified chronicity pattern, unspecified headache type  Improving encourage patient to maintain healthy diet with water hydration Follow-up if symptoms persist or get  worse   Follow Up Instructions: 6 weeks follow-up   I discussed the assessment and treatment plan with the patient. The patient was provided an opportunity to ask questions and all were answered. The patient agreed with the plan and demonstrated an understanding of the instructions.   The patient was advised to call back or seek an in-person evaluation if the symptoms worsen or if the condition fails to improve as anticipated.  I provided 6:23 minutes of non-face-to-face time during this encounter.   Barbette Merino, NP

## 2020-10-25 ENCOUNTER — Ambulatory Visit: Payer: Self-pay | Admitting: Nurse Practitioner

## 2020-12-23 ENCOUNTER — Other Ambulatory Visit: Payer: Self-pay

## 2020-12-23 ENCOUNTER — Encounter: Payer: Self-pay | Admitting: Nurse Practitioner

## 2020-12-23 ENCOUNTER — Ambulatory Visit: Payer: Medicaid Other | Admitting: Nurse Practitioner

## 2020-12-23 VITALS — BP 123/78 | HR 101 | Temp 98.6°F | Ht <= 58 in | Wt 105.6 lb

## 2020-12-23 DIAGNOSIS — Z758 Other problems related to medical facilities and other health care: Secondary | ICD-10-CM

## 2020-12-23 DIAGNOSIS — R059 Cough, unspecified: Secondary | ICD-10-CM

## 2020-12-23 DIAGNOSIS — Z789 Other specified health status: Secondary | ICD-10-CM | POA: Diagnosis not present

## 2020-12-23 DIAGNOSIS — J301 Allergic rhinitis due to pollen: Secondary | ICD-10-CM

## 2020-12-23 DIAGNOSIS — R630 Anorexia: Secondary | ICD-10-CM

## 2020-12-23 DIAGNOSIS — O24410 Gestational diabetes mellitus in pregnancy, diet controlled: Secondary | ICD-10-CM

## 2020-12-23 DIAGNOSIS — N898 Other specified noninflammatory disorders of vagina: Secondary | ICD-10-CM | POA: Diagnosis not present

## 2020-12-23 DIAGNOSIS — R519 Headache, unspecified: Secondary | ICD-10-CM

## 2020-12-23 DIAGNOSIS — D72829 Elevated white blood cell count, unspecified: Secondary | ICD-10-CM | POA: Diagnosis not present

## 2020-12-23 LAB — POCT GLYCOSYLATED HEMOGLOBIN (HGB A1C)
HbA1c POC (<> result, manual entry): 6 % (ref 4.0–5.6)
HbA1c, POC (controlled diabetic range): 6 % (ref 0.0–7.0)
HbA1c, POC (prediabetic range): 6 % (ref 5.7–6.4)
Hemoglobin A1C: 6 % — AB (ref 4.0–5.6)

## 2020-12-23 LAB — GLUCOSE, POCT (MANUAL RESULT ENTRY): POC Glucose: 229 mg/dl — AB (ref 70–99)

## 2020-12-23 MED ORDER — MIRTAZAPINE 7.5 MG PO TABS: 7.5000 mg | ORAL_TABLET | Freq: Every day | ORAL | 11 refills | Status: DC

## 2020-12-23 MED ORDER — LEVOCETIRIZINE DIHYDROCHLORIDE 5 MG PO TABS: 5.0000 mg | ORAL_TABLET | Freq: Every evening | ORAL | 3 refills | Status: DC

## 2020-12-23 NOTE — Patient Instructions (Signed)
Allergic Rhinitis, Adult Allergic rhinitis is a reaction to allergens. Allergens are things that can cause an allergic reaction. This condition affects the lining inside the nose (mucous membrane). There are two types of allergic rhinitis:  Seasonal. This type is also called hay fever. It happens only during some times of the year.  Perennial. This type can happen at any time of the year. This condition cannot be spread from person to person (is not contagious). It can be mild, worse, or very bad. It can develop at any age and may be outgrown. What are the causes? This condition may be caused by:  Pollen from grasses, trees, and weeds.  Dust mites.  Smoke.  Mold.  Car fumes.  The pee (urine), spit, or dander of pets. Dander is dead skin cells from a pet.   What increases the risk? You are more likely to develop this condition if:  You have allergies in your family.  You have problems like allergies in your family. You may have: ? Swelling of parts of your eyes and eyelids. ? Asthma. This affects how you breathe. ? Long-term redness and swelling on your skin. ? Food allergies. What are the signs or symptoms? The main symptom of this condition is a runny or stuffy nose (nasal congestion). Other symptoms may include:  Sneezing or coughing.  Itching and tearing of your eyes.  Mucus that drips down the back of your throat (postnasal drip).  Trouble sleeping.  Feeling tired.  Headache.  Sore throat. How is this treated? There is no cure for this condition. You should avoid things that you are allergic to. Treatment can help to relieve symptoms. This may include:  Medicines that block allergy symptoms, such as corticosteroids or antihistamines. These may be given as a shot, nasal spray, or pill.  Avoiding things you are allergic to.  Medicines that give you bits of what you are allergic to over time. This is called immunotherapy. It is done if other treatments do not  help. You may get: ? Shots. ? Medicine under your tongue.  Stronger medicines, if other treatments do not help. Follow these instructions at home: Avoiding allergens Find out what things you are allergic to and avoid them. To do this, try these things:  If you get allergies any time of year: ? Replace carpet with wood, tile, or vinyl flooring. Carpet can trap pet dander and dust. ? Do not smoke. Do not allow smoking in your home. ? Change your heating and air conditioning filters at least once a month.  If you get allergies only some times of the year: ? Keep windows closed when you can. ? Plan things to do outside when pollen counts are lowest. Check pollen counts before you plan things to do outside. ? When you come indoors, change your clothes and shower before you sit on furniture or bedding.   If you are allergic to a pet: ? Keep the pet out of your bedroom. ? Vacuum, sweep, and dust often.   General instructions  Take over-the-counter and prescription medicines only as told by your doctor.  Drink enough fluid to keep your pee (urine) pale yellow.  Keep all follow-up visits as told by your doctor. This is important. Where to find more information  American Academy of Allergy, Asthma & Immunology: www.aaaai.org Contact a doctor if:  You have a fever.  You get a cough that does not go away.  You make whistling sounds when you breathe (wheeze).  Your   symptoms slow you down.  Your symptoms stop you from doing your normal things each day. Get help right away if:  You are short of breath. This symptom may be an emergency. Do not wait to see if the symptom will go away. Get medical help right away. Call your local emergency services (911 in the [place] Do not drive yourself to the hospital. Summary  Allergic rhinitis may be treated by taking medicines and avoiding things you are allergic to.  If you have allergies only some of the year, keep windows closed when you  can at those times.  Contact your doctor if you get a fever or a cough that does not go away. This information is not intended to replace advice given to you by your health care provider. Make sure you discuss any questions you have with your health care provider. Document Revised: 10/13/2019 Document Reviewed: 08/19/2019 Elsevier Patient Education  2021 Elsevier Inc.   High-Protein and High-Calorie Diet Eating high-protein and high-calorie foods can help you to gain weight, heal after an injury, and recover after an illness or surgery. The specific amount of daily protein and calories you need depends on:  Your body weight.  The reason this diet is recommended for you. What is my plan? Generally, a high-protein, high-calorie diet involves:  Eating 250-500 extra calories each day.  Making sure that you get enough of your daily calories from protein. Ask your health care provider how many of your calories should come from protein. Talk with a health care provider, such as a diet and nutrition specialist (dietitian), about how much protein and how many calories you need each day. Follow the diet as directed by your health care provider. What are tips for following this plan? Preparing meals  Add whole milk, half-and-half, or heavy cream to cereal, pudding, soup, or hot cocoa.  Add whole milk to instant breakfast drinks.  Add peanut butter to oatmeal or smoothies.  Add powdered milk to baked goods, smoothies, or milkshakes.  Add powdered milk, cream, or butter to mashed potatoes.  Add cheese to cooked vegetables.  Make whole-milk yogurt parfaits. Top them with granola, fruit, or nuts.  Add cottage cheese to your fruit.  Add avocado, cheese, or both to sandwiches or salads.  Add meat, poultry, or seafood to rice, pasta, casseroles, salads, and soups.  Use mayonnaise when making egg salad, chicken salad, or tuna salad.  Use peanut butter as a dip for vegetables or as a  topping for pretzels, celery, or crackers.  Add beans to casseroles, dips, and spreads.  Add pureed beans to sauces and soups.  Replace calorie-free drinks with calorie-containing drinks, such as milk and fruit juice.  Replace water with milk or heavy cream when making foods such as oatmeal, pudding, or cocoa. General instructions  Ask your health care provider if you should take a nutritional supplement.  Try to eat six small meals each day instead of three large meals.  Eat a balanced diet. In each meal, include one food that is high in protein.  Keep nutritious snacks available, such as nuts, trail mixes, dried fruit, and yogurt.  If you have kidney disease or diabetes, talk with your health care provider about how much protein is safe for you. Too much protein may put extra stress on your kidneys.  Drink your calories. Choose high-calorie drinks and have them after your meals.   What high-protein foods should I eat? Vegetables Soybeans. Peas. Grains Quinoa. Bulgur wheat. Meats and  other proteins Beef, pork, and poultry. Fish and seafood. Eggs. Tofu. Textured vegetable protein (TVP). Peanut butter. Nuts and seeds. Dried beans. Protein powders. Dairy Whole milk. Whole-milk yogurt. Powdered milk. Cheese. [employer]. Eggnog. Beverages High-protein supplement drinks. Soy milk. Other foods Protein bars. The items listed above may not be a complete list of high-protein foods and beverages. Contact a dietitian for more options.   What high-calorie foods should I eat? Fruits Dried fruit. Fruit leather. Canned fruit in syrup. Fruit juice. Avocado. Vegetables Vegetables cooked in oil or butter. Fried potatoes. Grains Pasta. Quick breads. Muffins. Pancakes. Ready-to-eat cereal. Meats and other proteins Peanut butter. Nuts and seeds. Dairy Heavy cream. Whipped cream. Cream cheese. Sour cream. Ice cream. Custard. Pudding. Beverages Meal-replacement beverages. Nutrition  shakes. Fruit juice. Sugar-sweetened soft drinks. Seasonings and condiments Salad dressing. Mayonnaise. Alfredo sauce. Fruit preserves or jelly. Honey. Syrup. Sweets and desserts Cake. Cookies. Pie. Pastries. Candy bars. Chocolate. Fats and oils Butter or margarine. Oil. Gravy. Other foods Meal-replacement bars. The items listed above may not be a complete list of high-calorie foods and beverages. Contact a dietitian for more options. Summary  A high-protein, high-calorie diet can help you gain weight or heal faster after an injury, illness, or surgery.  To increase your protein and calories, add ingredients such as whole milk, peanut butter, cheese, beans, meat, or seafood to meal items.  To get enough extra calories each day, include high-calorie foods and beverages at each meal.  Adding a high-calorie drink or shake can be an easy way to help you get enough calories each day. Talk with your healthcare provider or dietitian about the best options for you. This information is not intended to replace advice given to you by your health care provider. Make sure you discuss any questions you have with your health care provider. Document Revised: 08/03/2017 Document Reviewed: 07/03/2017 Elsevier Patient Education  2021 Elsevier Inc.   Vaginitis  Vaginitis is irritation and swelling of the vagina. Treatment will depend on the cause. What are the causes? It can be caused by:  Bacteria.  Yeast.  A parasite.  A virus.  Low hormone levels.  Bubble baths, scented tampons, and feminine sprays. Other things can change the balance of the yeast and bacteria that live in the vagina. These include:  Antibiotic medicines.  Not being clean enough.  Some birth control methods.  Sex.  Infection.  Diabetes.  A weakened body defense system (immune system). What increases the risk?  Smoking or being around someone who smokes.  Using washes (douches), scented tampons, or scented  pads.  Wearing tight pants or thong underwear.  Using birth control pills or an IUD.  Having sex without a condom or having a lot of partners.  Having an STI.  Using a certain product to kill sperm (nonoxynol-9).  Eating foods that are high in sugar.  Having diabetes.  Having low levels of a female hormone.  Having a weakened body defense system.  Being pregnant or breastfeeding. What are the signs or symptoms?  Fluid coming from the vagina that is not normal.  A bad smell.  Itching, pain, or swelling.  Pain with sex.  Pain or burning when you pee (urinate). Sometimes there are no symptoms. How is this treated? Treatment may include:  Antibiotic creams or pills.  Antifungal medicines.  Medicines to ease symptoms if you have a virus. Your sex partner should also be treated.  Estrogen medicines.  Avoiding scented soaps, sprays, or douches.  Stopping  use of products that caused irritation and then using a cream to treat symptoms. Follow these instructions at home: Lifestyle  Keep the area around your vagina clean and dry. ? Avoid using soap. ? Rinse the area with water.  Until your doctor says it is okay: ? Do not use washes for the vagina. ? Do not use tampons. ? Do not have sex.  Wipe from front to back after going to the bathroom.  When your doctor says it is okay, practice safe sex and use condoms. General instructions  Take over-the-counter and prescription medicines only as told by your doctor.  If you were prescribed an antibiotic medicine, take or use it as told by your doctor. Do not stop taking or using it even if you start to feel better.  Keep all follow-up visits. How is this prevented?  Do not use things that can irritate the vagina, such as fabric softeners. Avoid these products if they are scented: ? Sprays. ? Detergents. ? Tampons. ? Products for cleaning the vagina. ? Soaps or bubble baths.  Let air reach your vagina. To do  this: ? Wear cotton underwear. ? Do not wear:  Underwear while you sleep.  Tight pants.  Thong underwear.  Underwear or nylons without a cotton panel. ? Take off any wet clothing, such as bathing suits, as soon as you can. ? Practice safe sex and use condoms. Contact a doctor if:  You have pain in your belly or in the area between your hips.  You have a fever or chills.  Your symptoms last for more than 2-3 days. Get help right away if:  You have a fever and your symptoms get worse all of a sudden. Summary  Vaginitis is irritation and swelling of the vagina.  Treatment will depend on the cause of the condition.  Do not use washes or tampons or have sex until your doctor says it is okay. This information is not intended to replace advice given to you by your health care provider. Make sure you discuss any questions you have with your health care provider. Document Revised: 02/19/2020 Document Reviewed: 02/19/2020 Elsevier Patient Education  2021 [employer].

## 2020-12-23 NOTE — Progress Notes (Signed)
[facility] [address] [place], [place]  [address] Phone:  [phone]   Fax:  [phone]   Acute Office Visit  Subjective:    Patient ID: Tiffany [name], female    DOB: June 21, 1981, 40 y.o.   MRN: [id number]  Chief Complaint  Patient presents with  . Headache    Going for 3 days headaches taking otc  comes and goes , coughing  some . Headache in middle of head ,no allergy     HPI Patient is in today for headache. She  has a past medical history of Appendicitis, COVID-19 (01/2019), and Medical history non-contributory.     Headache Patient presents for evaluation of headache. Symptoms began about 3 days ago. Generally, the headaches last about 3 days and occur continuously. The headaches do not seem to be related to any time of the day. The headaches are usually poorly described and general; with exercise, running or walking and are located in top.  The patient rates her most severe headaches a 7 on a scale from 1 to 10. Recently, the headaches have been increasing in both severity and frequency. Work attendance or other daily activities are affected by the headaches. Precipitating factors include: none which have been determined. The headaches are usually not preceded by an aura. Associated neurologic symptoms: not eating or sleeping well.. The patient denies depression, muscle weakness, numbness of extremities, speech difficulties and vision problems. Home treatment has included ibuprofen with no improvement. Other history includes: allergic rhinitis and previous hx of headache that impoved with OTC treatment . Family history includes no known family members with significant headaches.  She has a history of PNX.    She is concerned that she has low energy.  This causes her difficulty caring for her children.  She would like something to help stimulate her appetite.. Past Medical History:  Diagnosis Date  . Appendicitis   . COVID-19 01/2019  . Medical history  non-contributory     Past Surgical History:  Procedure Laterality Date  . ABDOMINAL HYSTERECTOMY N/A 07/15/2019   Procedure: HYSTERECTOMY ABDOMINAL;  Surgeon: Levie Heritage, DO;  Location: [facility];  Service: Obstetrics;  Laterality: N/A;  . CESAREAN SECTION N/A 07/15/2019   Procedure: CESAREAN SECTION;  Surgeon: Levie Heritage, DO;  Location: [facility];  Service: Obstetrics;  Laterality: N/A;  . LAPAROSCOPIC APPENDECTOMY N/A 04/20/2014   Procedure: APPENDECTOMY LAPAROSCOPIC;  Surgeon: Romie Levee, MD;  Location: [facility];  Service: General;  Laterality: N/A;  . supra cervical hysterectomy N/A     Family History  Problem Relation Age of Onset  . Cancer Mother   . Heart disease Father     Social History   Socioeconomic History  . Marital status: Single    Spouse name: Not on file  . Number of children: 3  . Years of education: 4   . Highest education level: Not on file  Occupational History  . Occupation: Unemployed     Employer: [employer]  Tobacco Use  . Smoking status: Never Smoker  . Smokeless tobacco: Never Used  Vaping Use  . Vaping Use: Never used  Substance and Sexual Activity  . Alcohol use: No  . Drug use: No  . Sexual activity: Yes    Birth control/protection: None  Other Topics Concern  . Not on file  Social History Narrative   From [place]   Moved  To [place] 08/11/2010   Live with 3 children, husband, brother.  Social Determinants of Health   Financial Resource Strain: Not on file  Food Insecurity: Not on file  Transportation Needs: Not on file  Physical Activity: Not on file  Stress: Not on file  Social Connections: Not on file  Intimate Partner Violence: Not on file    Outpatient Medications Prior to Visit  Medication Sig Dispense Refill  . ibuprofen (ADVIL) 600 MG tablet Take 1 tablet (600 mg total) by mouth every 6 (six) hours as needed. 30 tablet 0  . Prenatal Vit-Fe Fumarate-FA (PREPLUS) 27-1 MG TABS Take 1 tablet by mouth daily. 60 tablet 3   . ferrous sulfate 325 (65 FE) MG tablet Take 1 tablet (325 mg total) by mouth daily with breakfast. 60 tablet 0  . simethicone (MYLICON) 80 MG chewable tablet Chew 1 tablet (80 mg total) by mouth 4 (four) times daily as needed for flatulence. 30 tablet 2   No facility-administered medications prior to visit.    No Known Allergies  Review of Systems  Constitutional: Positive for fatigue.  Respiratory: Positive for cough.   Genitourinary: Positive for vaginal discharge (white and yellow small ). Negative for decreased urine volume, dysuria, menstrual problem (hysterectomy), pelvic pain, vaginal bleeding and vaginal pain.       No odor       Objective:    Physical Exam Constitutional:      General: She is not in acute distress.    Appearance: She is not ill-appearing, toxic-appearing or diaphoretic.  HENT:     Head: Normocephalic and atraumatic.     Mouth/Throat:     Mouth: Mucous membranes are moist.  Eyes:     Extraocular Movements: Extraocular movements intact.  Cardiovascular:     Rate and Rhythm: Normal rate and regular rhythm.     Heart sounds: Normal heart sounds.  Pulmonary:     Effort: Pulmonary effort is normal.     Breath sounds: Normal breath sounds.  Musculoskeletal:     Cervical back: Normal range of motion.  Skin:    General: Skin is warm and dry.     Capillary Refill: Capillary refill takes less than 2 seconds.  Neurological:     Mental Status: She is alert and oriented to person, place, and time.     Cranial Nerves: No cranial nerve deficit.     Sensory: No sensory deficit.     Motor: No weakness.  Psychiatric:        Mood and Affect: Mood normal.        Speech: Speech normal.        Behavior: Behavior normal.     BP 123/78 (BP Location: Left Arm, Patient Position: Sitting, Cuff Size: Normal)   Pulse (!) 101   Temp 98.6 F (37 C) (Temporal)   Ht 4\' 9"  (1.448 m)   Wt 105 lb 9.6 oz (47.9 kg)   LMP 11/08/2018 (Exact Date)   SpO2 99%   BMI 22.85  kg/m  Wt Readings from Last 3 Encounters:  12/23/20 105 lb 9.6 oz (47.9 kg)  06/02/20 102 lb (46.3 kg)  05/12/20 100 lb 0.1 oz (45.4 kg)    There are no preventive care reminders to display for this patient.  There are no preventive care reminders to display for this patient.   Lab Results  Component Value Date   TSH 1.740 03/26/2019   Lab Results  Component Value Date   WBC 13.4 (H) 07/18/2019   HGB 9.7 (L) 07/18/2019   HCT 28.3 (L) 07/18/2019  MCV 84.7 07/18/2019   PLT 120 (L) 07/18/2019   Lab Results  Component Value Date   NA 140 07/18/2019   K 4.0 07/18/2019   CO2 24 07/18/2019   GLUCOSE 78 07/18/2019   BUN 6 07/18/2019   CREATININE 0.66 07/18/2019   BILITOT 0.6 07/18/2019   ALKPHOS 72 07/18/2019   AST 75 (H) 07/18/2019   ALT 33 07/18/2019   PROT 4.7 (L) 07/18/2019   ALBUMIN 2.0 (L) 07/18/2019   CALCIUM 8.3 (L) 07/18/2019   ANIONGAP 13 07/18/2019   No results found for: CHOL No results found for: HDL No results found for: LDLCALC No results found for: TRIG No results found for: CHOLHDL Lab Results  Component Value Date   HGBA1C 6.0 (A) 12/23/2020   HGBA1C 6.0 12/23/2020   HGBA1C 6.0 12/23/2020   HGBA1C 6.0 12/23/2020       Assessment & Plan:   Problem List Items Addressed This Visit      Endocrine   GDM (gestational diabetes mellitus) A1C 6.0% Education provided   Relevant Orders   Glucose (CBG) (Completed)   HgB A1c (Completed)     Other   Language barrier    Other Visit Diagnoses    Nonintractable headache, unspecified chronicity pattern, unspecified headache type    -  Primary   Persistent evaluation with labs Relevant Medications   mirtazapine (REMERON) 7.5 MG tablet   Other Relevant Orders   Comp. Metabolic Panel (12) (Completed)   Cough       Relevant Orders   Ambulatory referral to Allergy   Acute nonintractable headache, unspecified headache type       Relevant Medications   mirtazapine (REMERON) 7.5 MG tablet    Seasonal allergic rhinitis due to pollen       Relevant Orders   Ambulatory referral to Allergy   Leukocytosis, unspecified type       Relevant Orders   CBC with Differential/Platelet (Completed)   Vaginal discharge   persistent no complications      Relevant Orders   NuSwab Vaginitis Plus (VG+) (Completed)   Appetite impaired    worsening  requesting assistance with stimulating appetite with a goal to produce more energy Education provided  Trial mirtazapine 7.5 mg        Meds ordered this encounter  Medications  . mirtazapine (REMERON) 7.5 MG tablet    Sig: Take 1 tablet (7.5 mg total) by mouth at bedtime.    Dispense:  30 tablet    Refill:  11    Order Specific Question:   Supervising Provider    Answer:   Quentin Angst [id number]  . levocetirizine (XYZAL) 5 MG tablet    Sig: Take 1 tablet (5 mg total) by mouth every evening.    Dispense:  90 tablet    Refill:  3    Order Specific Question:   Supervising Provider    Answer:   Quentin Angst [[id number]]     Barbette Merino, NP

## 2020-12-24 LAB — CBC WITH DIFFERENTIAL/PLATELET
Basophils Absolute: 0.1 10*3/uL (ref 0.0–0.2)
Basos: 1 %
EOS (ABSOLUTE): 0.4 10*3/uL (ref 0.0–0.4)
Eos: 5 %
Hematocrit: 37.9 % (ref 34.0–46.6)
Hemoglobin: 12.7 g/dL (ref 11.1–15.9)
Immature Grans (Abs): 0 10*3/uL (ref 0.0–0.1)
Immature Granulocytes: 0 %
Lymphocytes Absolute: 3.2 10*3/uL — ABNORMAL HIGH (ref 0.7–3.1)
Lymphs: 37 %
MCH: 29.6 pg (ref 26.6–33.0)
MCHC: 33.5 g/dL (ref 31.5–35.7)
MCV: 88 fL (ref 79–97)
Monocytes Absolute: 0.6 10*3/uL (ref 0.1–0.9)
Monocytes: 7 %
Neutrophils Absolute: 4.4 10*3/uL (ref 1.4–7.0)
Neutrophils: 50 %
Platelets: 312 10*3/uL (ref 150–450)
RBC: 4.29 x10E6/uL (ref 3.77–5.28)
RDW: 13.1 % (ref 11.7–15.4)
WBC: 8.7 10*3/uL (ref 3.4–10.8)

## 2020-12-24 LAB — COMP. METABOLIC PANEL (12)
AST: 16 IU/L (ref 0–40)
Albumin/Globulin Ratio: 1.4 (ref 1.2–2.2)
Albumin: 4.4 g/dL (ref 3.8–4.8)
Alkaline Phosphatase: 95 IU/L (ref 44–121)
BUN/Creatinine Ratio: 23 (ref 9–23)
BUN: 16 mg/dL (ref 6–24)
Bilirubin Total: <0.2 mg/dL (ref 0.0–1.2)
Calcium: 9.5 mg/dL (ref 8.7–10.2)
Chloride: 98 mmol/L (ref 96–106)
Creatinine, Ser: 0.69 mg/dL (ref 0.57–1.00)
Globulin, Total: 3.2 g/dL (ref 1.5–4.5)
Glucose: 225 mg/dL — ABNORMAL HIGH (ref 65–99)
Potassium: 4.3 mmol/L (ref 3.5–5.2)
Sodium: 137 mmol/L (ref 134–144)
Total Protein: 7.6 g/dL (ref 6.0–8.5)
eGFR: 112 mL/min/{1.73_m2} (ref >59)

## 2020-12-26 LAB — NUSWAB VAGINITIS PLUS (VG+)
Candida albicans, NAA: NEGATIVE
Candida glabrata, NAA: NEGATIVE
Chlamydia trachomatis, NAA: NEGATIVE
Neisseria gonorrhoeae, NAA: NEGATIVE
Trich vag by NAA: NEGATIVE

## 2021-02-03 ENCOUNTER — Ambulatory Visit (INDEPENDENT_AMBULATORY_CARE_PROVIDER_SITE_OTHER): Payer: Medicaid Other | Admitting: Allergy & Immunology

## 2021-02-03 ENCOUNTER — Encounter: Payer: Self-pay | Admitting: Allergy & Immunology

## 2021-02-03 ENCOUNTER — Other Ambulatory Visit: Payer: Self-pay

## 2021-02-03 VITALS — BP 100/68 | HR 87 | Temp 98.1°F | Resp 18 | Ht <= 58 in | Wt 98.0 lb

## 2021-02-03 DIAGNOSIS — J310 Chronic rhinitis: Secondary | ICD-10-CM | POA: Diagnosis not present

## 2021-02-03 DIAGNOSIS — R059 Cough, unspecified: Secondary | ICD-10-CM

## 2021-02-03 MED ORDER — LEVOCETIRIZINE DIHYDROCHLORIDE 5 MG PO TABS: 5.0000 mg | ORAL_TABLET | Freq: Every evening | ORAL | 3 refills | Status: DC

## 2021-02-03 MED ORDER — FLUTICASONE PROPIONATE 50 MCG/ACT NA SUSP: 2.0000 | Freq: Every day | NASAL | 3 refills | Status: DC
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
## 2021-02-03 NOTE — Patient Instructions (Addendum)
1. Cough - Lung testing looked normal - I think that your cough might be related to your postnasal drip and allergies. - We can give you albuterol to use when you cough to see if he helps at all.  - Use two puffs every 4-6 hours as needed (sample of ProAir Respiclick provided).  2. Chronic rhinitis - Testing today showed: indoor molds - Copy of test results provided.  - Avoidance measures provided. - Stop taking: mirtazapine - Continue with: Xyzal (levocetirizine) 5mg  tablet once daily - Start taking: Flonase (fluticasone) two sprays per nostril daily (to decrease inflammation in your nose and hopefully improve the headaches)  - You can use an extra dose of the antihistamine, if needed, for breakthrough symptoms.  - Consider nasal saline rinses 1-2 times daily to remove allergens from the nasal cavities as well as help with mucous clearance (this is especially helpful to do before the nasal sprays are given)  3. Return in about 4 weeks (around 03/03/2021).    Please inform 03/05/2021 of any Emergency Department visits, hospitalizations, or changes in symptoms. Call us before going to the ED for breathing or allergy symptoms since we might be able to fit you in for a sick visit. Feel free to contact us anytime with any questions, problems, or concerns.  It was a pleasure to meet you today!  Websites that have reliable patient information: 1. American Academy of Asthma, Allergy, and Immunology: www.aaaai.org 2. Food Allergy Research and Education (FARE): foodallergy.org 3. Mothers of Asthmatics: http://www.asthmacommunitynetwork.org 4. American College of Allergy, Asthma, and Immunology: www.acaai.org   COVID-19 Vaccine Information can be found at: [place] For questions related to vaccine distribution or appointments, please email [email or web address] or call [phone].   We realize that you might be concerned about  having an allergic reaction to the COVID19 vaccines. To help with that concern, WE ARE OFFERING THE COVID19 VACCINES IN OUR OFFICE! Ask the front desk for dates!     "Like" [phone] on Facebook and Instagram for our latest updates!      A healthy democracy works best when [place] participate! Make sure you are registered to vote! If you have moved or changed any of your contact information, you will need to get this updated before voting!  In some cases, you MAY be able to register to vote online: [employer]    1. Control-Buffer 50% Glycerol 2+   2. Control-Histamine 1 mg/ml Negative   3. Albumin saline Negative   4. Bahia Negative   5. AromatherapyCrystals.be Negative   6. Johnson Negative   7. [place] Blue Negative   8. Meadow Fescue Negative   9. Perennial Rye Negative   10. Sweet Vernal Negative   11. Timothy Negative   12. Cocklebur Negative   13. Burweed Marshelder Negative   14. Ragweed, short Negative   15. Ragweed, Giant Negative   16. Plantain,  English Negative   17. Lamb's Quarters Negative   18. Sheep Sorrell Negative   19. Rough Pigweed Negative   20. Marsh Elder, Rough Negative   21. Mugwort, Common Negative   22. Ash mix Negative   23. Birch mix Negative   24. Beech American Negative   25. Box, Elder Negative   26. Cedar, red Negative   27. Cottonwood, [place] Negative   28. Elm mix Negative   29. Hickory Negative   30. Maple mix Negative   31. Oak, Guinea[place]au mix Negative   32. Pecan Pollen Negative   33. Pine mix Negative  34. Sycamore Eastern Negative   35. Walnut, Black Pollen Negative   36. Alternaria alternata Negative   37. Cladosporium Herbarum Negative   38. Aspergillus mix Negative   39. Penicillium mix Negative   40. Bipolaris sorokiniana (Helminthosporium) Negative   41. Drechslera spicifera (Curvularia) Negative   42. Mucor plumbeus Negative   43. Fusarium moniliforme Negative   44. Aureobasidium pullulans  (pullulara) Negative   45. Rhizopus oryzae Negative   46. Botrytis cinera Negative   47. Epicoccum nigrum Negative   48. Phoma betae Negative   49. Candida Albicans Negative   50. Trichophyton mentagrophytes Negative   51. Mite, D Farinae  5,000 AU/ml Negative   52. Mite, D Pteronyssinus  5,000 AU/ml Negative   53. Cat Hair 10,000 BAU/ml Negative   54.  Dog Epithelia Negative   55. Mixed Feathers Negative   56. Horse Epithelia Negative   57. Cockroach, German Negative   58. Mouse Negative   59. Tobacco Leaf Negative     Control Negative   [place] Negative   Johnson Negative   7 Grass Negative   Ragweed mix Negative   Weed mix Negative   Tree mix Negative   Mold 1 Negative   Mold 2 2+   Mold 3 Negative   Mold 4 Negative   Cat Negative   Dog Negative   Cockroach Negative   Mite mix Negative    Control of Mold Allergen   Mold and fungi can grow on a variety of surfaces provided certain temperature and moisture conditions exist.  Outdoor molds grow on plants, decaying vegetation and soil.  The major outdoor mold, Alternaria and Cladosporium, are found in very high numbers during hot and dry conditions.  Generally, a late Summer - Fall peak is seen for common outdoor fungal spores.  Rain will temporarily lower outdoor mold spore count, but counts rise rapidly when the rainy period ends.  The most important indoor molds are Aspergillus and Penicillium.  Dark, humid and poorly ventilated basements are ideal sites for mold growth.  The next most common sites of mold growth are the bathroom and the kitchen.   Indoor (Perennial) Mold Control   Positive indoor molds via skin testing: Aspergillus and Penicillium  1. Maintain humidity below 50%. 2. Clean washable surfaces with 5% bleach solution. 3. Remove sources e.g. contaminated carpets.

## 2021-02-05 ENCOUNTER — Encounter: Payer: Self-pay | Admitting: Allergy & Immunology

## 2021-02-23 ENCOUNTER — Other Ambulatory Visit: Payer: Self-pay

## 2021-02-23 ENCOUNTER — Encounter: Payer: Self-pay | Admitting: Nurse Practitioner

## 2021-02-23 ENCOUNTER — Ambulatory Visit (INDEPENDENT_AMBULATORY_CARE_PROVIDER_SITE_OTHER): Payer: Medicaid Other | Admitting: Nurse Practitioner

## 2021-02-23 VITALS — BP 105/63 | HR 96 | Temp 98.4°F | Ht <= 58 in | Wt 98.0 lb

## 2021-02-23 DIAGNOSIS — R7303 Prediabetes: Secondary | ICD-10-CM

## 2021-02-23 DIAGNOSIS — R634 Abnormal weight loss: Secondary | ICD-10-CM

## 2021-02-23 DIAGNOSIS — R630 Anorexia: Secondary | ICD-10-CM | POA: Diagnosis not present

## 2021-02-23 LAB — GLUCOSE, POCT (MANUAL RESULT ENTRY): POC Glucose: 161 mg/dl — AB (ref 70–99)

## 2021-02-23 NOTE — Patient Instructions (Signed)
Prediabetes Eating Plan °Prediabetes is a condition that causes blood sugar (glucose) levels to be higher than normal. This increases the risk for developing type 2 diabetes (type 2 diabetes mellitus). Working with a health care provider or nutrition specialist (dietitian) to make diet and lifestyle changes can help prevent the onset of diabetes. These changes may help you: °Control your blood glucose levels. °Improve your cholesterol levels. °Manage your blood pressure. °What are tips for following this plan? °Reading food labels °Read food labels to check the amount of fat, salt (sodium), and sugar in prepackaged foods. Avoid foods that have: °Saturated fats. °Trans fats. °Added sugars. °Avoid foods that have more than 300 milligrams (mg) of sodium per serving. Limit your sodium intake to less than 2,300 mg each day. °Shopping °Avoid buying pre-made and processed foods. °Avoid buying drinks with added sugar. °Cooking °Cook with olive oil. Do not use butter, lard, or ghee. °Bake, broil, grill, steam, or boil foods. Avoid frying. °Meal planning ° °Work with your dietitian to create an eating plan that is right for you. This may include tracking how many calories you take in each day. Use a food diary, notebook, or mobile application to track what you eat at each meal. °Consider following a Mediterranean diet. This includes: °Eating several servings of fresh fruits and vegetables each day. °Eating fish at least twice a week. °Eating one serving each day of whole grains, beans, nuts, and seeds. °Using olive oil instead of other fats. °Limiting alcohol. °Limiting red meat. °Using nonfat or low-fat dairy products. °Consider following a plant-based diet. This includes dietary choices that focus on eating mostly vegetables and fruit, grains, beans, nuts, and seeds. °If you have high blood pressure, you may need to limit your sodium intake or follow a diet such as the DASH (Dietary Approaches to Stop Hypertension) eating  plan. The DASH diet aims to lower high blood pressure. °Lifestyle °Set weight loss goals with help from your health care team. It is recommended that most people with prediabetes lose 7% of their body weight. °Exercise for at least 30 minutes 5 or more days a week. °Attend a support group or seek support from a mental health counselor. °Take over-the-counter and prescription medicines only as told by your health care provider. °What foods are recommended? °Fruits °Berries. Bananas. Apples. Oranges. Grapes. Papaya. Mango. Pomegranate. Kiwi. Grapefruit. Cherries. °Vegetables °Lettuce. Spinach. Peas. Beets. Cauliflower. Cabbage. Broccoli. Carrots. Tomatoes. Squash. Eggplant. Herbs. Peppers. Onions. Cucumbers. Brussels sprouts. °Grains °Whole grains, such as whole-wheat or whole-grain breads, crackers, cereals, and pasta. Unsweetened oatmeal. Bulgur. Barley. Quinoa. Brown rice. Corn or whole-wheat flour tortillas or taco shells. °Meats and other proteins °Seafood. Poultry without skin. Lean cuts of pork and beef. Tofu. Eggs. Nuts. Beans. °Dairy °Low-fat or fat-free dairy products, such as yogurt, cottage cheese, and cheese. °Beverages °Water. Tea. Coffee. Sugar-free or diet soda. Seltzer water. Low-fat or nonfat milk. Milk alternatives, such as soy or almond milk. °Fats and oils °Olive oil. Canola oil. Sunflower oil. Grapeseed oil. Avocado. Walnuts. °Sweets and desserts °Sugar-free or low-fat pudding. Sugar-free or low-fat ice cream and other frozen treats. °Seasonings and condiments °Herbs. Sodium-free spices. Mustard. Relish. Low-salt, low-sugar ketchup. Low-salt, low-sugar barbecue sauce. Low-fat or fat-free mayonnaise. °The items listed above may not be a complete list of recommended foods and beverages. Contact a dietitian for more information. °What foods are not recommended? °Fruits °Fruits canned with syrup. °Vegetables °Canned vegetables. Frozen vegetables with butter or cream sauce. °Grains °Refined white  flour and flour   products, such as bread, pasta, snack foods, and cereals. °Meats and other proteins °Fatty cuts of meat. Poultry with skin. Breaded or fried meat. Processed meats. °Dairy °Full-fat yogurt, cheese, or milk. °Beverages °Sweetened drinks, such as iced tea and soda. °Fats and oils °Butter. Lard. Ghee. °Sweets and desserts °Baked goods, such as cake, cupcakes, pastries, cookies, and cheesecake. °Seasonings and condiments °Spice mixes with added salt. Ketchup. Barbecue sauce. Mayonnaise. °The items listed above may not be a complete list of foods and beverages that are not recommended. Contact a dietitian for more information. °Where to find more information °American Diabetes Association: www.diabetes.org °Summary °You may need to make diet and lifestyle changes to help prevent the onset of diabetes. These changes can help you control blood sugar, improve cholesterol levels, and manage blood pressure. °Set weight loss goals with help from your health care team. It is recommended that most people with prediabetes lose 7% of their body weight. °Consider following a Mediterranean diet. This includes eating plenty of fresh fruits and vegetables, whole grains, beans, nuts, seeds, fish, and low-fat dairy, and using olive oil instead of other fats. °This information is not intended to replace advice given to you by your health care provider. Make sure you discuss any questions you have with your health care provider. °Document Revised: 11/20/2019 Document Reviewed: 11/20/2019 °Elsevier Patient Education © 2022 Elsevier Inc. ° °

## 2021-02-23 NOTE — Progress Notes (Signed)
[place],   [address] Phone:  [phone]   Fax:  [phone]   Established Patient Office Visit  Subjective:  Patient ID: Tiffany [name], female    DOB: [birth year]  Age: 40 y.o. MRN: [id number]  CC:  Chief Complaint  Patient presents with   Follow-up    No questions or concerns.     HPI Tiffany [name] presents for follow up. She  has a past medical history of Appendicitis, COVID-19 (01/2019), and Medical history non-contributory.   The appointment was set follow-up due to start of a new medication.  She had previously reported low energy with decreased appetite.  She was requesting something to stimulate her appetite.  She was started on mirtazapine.  She was told by allergist to discontinue.  She reports that she would like to restart the medication.  Her weight is down 7 pounds.  She has history of prediabetes and would like to have this reevaluated however last A1c was checked 2 months ago.  She is not doing any home monitoring.  Past Medical History:  Diagnosis Date   Appendicitis    COVID-19 01/2019   Medical history non-contributory     Past Surgical History:  Procedure Laterality Date   ABDOMINAL HYSTERECTOMY N/A 07/15/2019   Procedure: HYSTERECTOMY ABDOMINAL;  Surgeon: Truett Mainland, DO;  Location: [facility] LD ORS;  Service: Obstetrics;  Laterality: N/A;   CESAREAN SECTION N/A 07/15/2019   Procedure: CESAREAN SECTION;  Surgeon: Truett Mainland, DO;  Location: [facility];  Service: Obstetrics;  Laterality: N/A;   LAPAROSCOPIC APPENDECTOMY N/A 04/20/2014   Procedure: APPENDECTOMY LAPAROSCOPIC;  Surgeon: Leighton Ruff, MD;  Location: [facility] ORS;  Service: General;  Laterality: N/A;   supra cervical hysterectomy N/A     Family History  Problem Relation Age of Onset   Cancer Mother    Heart disease Father     Social History   Socioeconomic History   Marital status: Single    Spouse name: Not on file   Number of children: 3   Years  of education: 4    Highest education level: Not on file  Occupational History   Occupation: Unemployed     Employer: [employer]  Tobacco Use   Smoking status: Never   Smokeless tobacco: Never  Vaping Use   Vaping Use: Never used  Substance and Sexual Activity   Alcohol use: No   Drug use: No   Sexual activity: Yes    Birth control/protection: None  Other Topics Concern   Not on file  Social History Narrative   From [place]   Moved  To [place] 08/11/2010   Live with 3 children, husband, brother.    Social Determinants of Health   Financial Resource Strain: Not on file  Food Insecurity: Not on file  Transportation Needs: Not on file  Physical Activity: Not on file  Stress: Not on file  Social Connections: Not on file  Intimate Partner Violence: Not on file    Outpatient Medications Prior to Visit  Medication Sig Dispense Refill   fluticasone (FLONASE) 50 MCG/ACT nasal spray Place 2 sprays into both nostrils daily. 16 g 3   levocetirizine (XYZAL) 5 MG tablet Take 1 tablet (5 mg total) by mouth every evening. 90 tablet 3   mirtazapine (REMERON) 7.5 MG tablet Take 1 tablet (7.5 mg total) by mouth at bedtime. 30 tablet 11   ferrous sulfate 325 (65 FE) MG tablet Take 1 tablet (  325 mg total) by mouth daily with breakfast. 60 tablet 0   ibuprofen (ADVIL) 600 MG tablet Take 1 tablet (600 mg total) by mouth every 6 (six) hours as needed. (Patient not taking: Reported on 02/03/2021) 30 tablet 0   Prenatal Vit-Fe Fumarate-FA (PREPLUS) 27-1 MG TABS Take 1 tablet by mouth daily. 60 tablet 3   No facility-administered medications prior to visit.    No Known Allergies  ROS Review of Systems    Objective:    Physical Exam Constitutional:      General: She is not in acute distress. HENT:     Head: Normocephalic and atraumatic.     Nose: Nose normal.     Mouth/Throat:     Mouth: Mucous membranes are moist.  Cardiovascular:     Rate and Rhythm: Normal rate.     Pulses: Normal pulses.   Pulmonary:     Effort: Pulmonary effort is normal.  Musculoskeletal:        General: Normal range of motion.     Cervical back: Normal range of motion.  Skin:    General: Skin is warm and dry.     Capillary Refill: Capillary refill takes less than 2 seconds.  Neurological:     General: No focal deficit present.     Mental Status: She is alert and oriented to person, place, and time.  Psychiatric:        Behavior: Behavior normal.        Thought Content: Thought content normal.        Judgment: Judgment normal.    BP 105/63   Pulse 96   Temp 98.4 F (36.9 C)   Ht 4' 9" (1.448 m)   Wt 98 lb (44.5 kg)   LMP 11/08/2018 (Exact Date)   SpO2 98%   Breastfeeding Yes   BMI 21.21 kg/m  Wt Readings from Last 3 Encounters:  02/23/21 98 lb (44.5 kg)  02/03/21 98 lb (44.5 kg)  12/23/20 105 lb 9.6 oz (47.9 kg)     There are no preventive care reminders to display for this patient.  There are no preventive care reminders to display for this patient.  Lab Results  Component Value Date   TSH 1.740 03/26/2019   Lab Results  Component Value Date   WBC 8.7 12/23/2020   HGB 12.7 12/23/2020   HCT 37.9 12/23/2020   MCV 88 12/23/2020   PLT 312 12/23/2020   Lab Results  Component Value Date   NA 137 12/23/2020   K 4.3 12/23/2020   CO2 24 07/18/2019   GLUCOSE 225 (H) 12/23/2020   BUN 16 12/23/2020   CREATININE 0.69 12/23/2020   BILITOT <0.2 12/23/2020   ALKPHOS 95 12/23/2020   AST 16 12/23/2020   ALT 33 07/18/2019   PROT 7.6 12/23/2020   ALBUMIN 4.4 12/23/2020   CALCIUM 9.5 12/23/2020   ANIONGAP 13 07/18/2019   EGFR 112 12/23/2020   No results found for: CHOL No results found for: HDL No results found for: LDLCALC No results found for: TRIG No results found for: CHOLHDL Lab Results  Component Value Date   HGBA1C 6.0 (A) 12/23/2020   HGBA1C 6.0 12/23/2020   HGBA1C 6.0 12/23/2020   HGBA1C 6.0 12/23/2020      Assessment & Plan:   Problem List Items Addressed  This Visit   None Visit Diagnoses     Appetite impaired    -  Primary Will restart mirtazapine follow-up in 6 weeks for reevaluation of medication  and   Prediabetes   Stable remains at 6.0% Consider home glucose monitoring lifestyle modification dietary changes and regular daily exercise Education material provided    Relevant Orders   Glucose (CBG) (Completed)   Weight loss     Persistent weight down 7 pounds       No orders of the defined types were placed in this encounter.   Follow-up: Return in about 6 weeks (around 04/06/2021).    Vevelyn Francois, NP

## 2021-02-24 ENCOUNTER — Encounter: Payer: Self-pay | Admitting: Nurse Practitioner

## 2021-03-03 ENCOUNTER — Ambulatory Visit: Payer: Medicaid Other | Admitting: Family Medicine

## 2021-03-03 NOTE — Progress Notes (Deleted)
   [address] [place] [address] Dept: [phone]  FOLLOW UP NOTE  Patient ID: Tiffany [name], female    DOB: October 08, 1980  Age: 40 y.o. MRN: [id number] Date of Office Visit: 03/03/2021  Assessment  Chief Complaint: No chief complaint on file.  HPI Phillip Boutwell    Drug Allergies:  No Known Allergies  Physical Exam: LMP 11/08/2018 (Exact Date)    Physical Exam  Diagnostics:    Assessment and Plan: No diagnosis found.  No orders of the defined types were placed in this encounter.   There are no Patient Instructions on file for this visit.  No follow-ups on file.    Thank you for the opportunity to care for this patient.  Please do not hesitate to contact me with questions.  Thermon Leyland, FNP [facility]

## 2021-04-18 ENCOUNTER — Encounter: Payer: Self-pay | Admitting: Nurse Practitioner

## 2021-04-18 ENCOUNTER — Ambulatory Visit: Payer: Medicaid Other | Admitting: Nurse Practitioner

## 2021-04-18 ENCOUNTER — Other Ambulatory Visit: Payer: Self-pay

## 2021-04-18 VITALS — BP 111/81 | HR 95 | Temp 98.0°F | Ht <= 58 in | Wt 102.0 lb

## 2021-04-18 DIAGNOSIS — R630 Anorexia: Secondary | ICD-10-CM | POA: Diagnosis not present

## 2021-04-18 DIAGNOSIS — G4700 Insomnia, unspecified: Secondary | ICD-10-CM | POA: Diagnosis not present

## 2021-04-18 DIAGNOSIS — R7303 Prediabetes: Secondary | ICD-10-CM

## 2021-04-18 LAB — POCT GLYCOSYLATED HEMOGLOBIN (HGB A1C): Hemoglobin A1C: 6.1 % — AB (ref 4.0–5.6)

## 2021-04-18 LAB — POCT URINALYSIS DIPSTICK
Bilirubin, UA: NEGATIVE
Blood, UA: NEGATIVE
Glucose, UA: NEGATIVE
Ketones, UA: NEGATIVE
Leukocytes, UA: NEGATIVE
Nitrite, UA: NEGATIVE
Protein, UA: NEGATIVE
Spec Grav, UA: 1.015 (ref 1.010–1.025)
Urobilinogen, UA: 0.2 E.U./dL
pH, UA: 5.5 (ref 5.0–8.0)

## 2021-04-18 NOTE — Patient Instructions (Signed)
Prediabetes Eating Plan °Prediabetes is a condition that causes blood sugar (glucose) levels to be higher than normal. This increases the risk for developing type 2 diabetes (type 2 diabetes mellitus). Working with a health care provider or nutrition specialist (dietitian) to make diet and lifestyle changes can help prevent the onset of diabetes. These changes may help you: °Control your blood glucose levels. °Improve your cholesterol levels. °Manage your blood pressure. °What are tips for following this plan? °Reading food labels °Read food labels to check the amount of fat, salt (sodium), and sugar in prepackaged foods. Avoid foods that have: °Saturated fats. °Trans fats. °Added sugars. °Avoid foods that have more than 300 milligrams (mg) of sodium per serving. Limit your sodium intake to less than 2,300 mg each day. °Shopping °Avoid buying pre-made and processed foods. °Avoid buying drinks with added sugar. °Cooking °Cook with olive oil. Do not use butter, lard, or ghee. °Bake, broil, grill, steam, or boil foods. Avoid frying. °Meal planning ° °Work with your dietitian to create an eating plan that is right for you. This may include tracking how many calories you take in each day. Use a food diary, notebook, or mobile application to track what you eat at each meal. °Consider following a Mediterranean diet. This includes: °Eating several servings of fresh fruits and vegetables each day. °Eating fish at least twice a week. °Eating one serving each day of whole grains, beans, nuts, and seeds. °Using olive oil instead of other fats. °Limiting alcohol. °Limiting red meat. °Using nonfat or low-fat dairy products. °Consider following a plant-based diet. This includes dietary choices that focus on eating mostly vegetables and fruit, grains, beans, nuts, and seeds. °If you have high blood pressure, you may need to limit your sodium intake or follow a diet such as the DASH (Dietary Approaches to Stop Hypertension) eating  plan. The DASH diet aims to lower high blood pressure. °Lifestyle °Set weight loss goals with help from your health care team. It is recommended that most people with prediabetes lose 7% of their body weight. °Exercise for at least 30 minutes 5 or more days a week. °Attend a support group or seek support from a mental health counselor. °Take over-the-counter and prescription medicines only as told by your health care provider. °What foods are recommended? °Fruits °Berries. Bananas. Apples. Oranges. Grapes. Papaya. Mango. Pomegranate. Kiwi. Grapefruit. Cherries. °Vegetables °Lettuce. Spinach. Peas. Beets. Cauliflower. Cabbage. Broccoli. Carrots. Tomatoes. Squash. Eggplant. Herbs. Peppers. Onions. Cucumbers. Brussels sprouts. °Grains °Whole grains, such as whole-wheat or whole-grain breads, crackers, cereals, and pasta. Unsweetened oatmeal. Bulgur. Barley. Quinoa. Brown rice. Corn or whole-wheat flour tortillas or taco shells. °Meats and other proteins °Seafood. Poultry without skin. Lean cuts of pork and beef. Tofu. Eggs. Nuts. Beans. °Dairy °Low-fat or fat-free dairy products, such as yogurt, cottage cheese, and cheese. °Beverages °Water. Tea. Coffee. Sugar-free or diet soda. Seltzer water. Low-fat or nonfat milk. Milk alternatives, such as soy or almond milk. °Fats and oils °Olive oil. Canola oil. Sunflower oil. Grapeseed oil. Avocado. Walnuts. °Sweets and desserts °Sugar-free or low-fat pudding. Sugar-free or low-fat ice cream and other frozen treats. °Seasonings and condiments °Herbs. Sodium-free spices. Mustard. Relish. Low-salt, low-sugar ketchup. Low-salt, low-sugar barbecue sauce. Low-fat or fat-free mayonnaise. °The items listed above may not be a complete list of recommended foods and beverages. Contact a dietitian for more information. °What foods are not recommended? °Fruits °Fruits canned with syrup. °Vegetables °Canned vegetables. Frozen vegetables with butter or cream sauce. °Grains °Refined white  flour and flour   products, such as bread, pasta, snack foods, and cereals. °Meats and other proteins °Fatty cuts of meat. Poultry with skin. Breaded or fried meat. Processed meats. °Dairy °Full-fat yogurt, cheese, or milk. °Beverages °Sweetened drinks, such as iced tea and soda. °Fats and oils °Butter. Lard. Ghee. °Sweets and desserts °Baked goods, such as cake, cupcakes, pastries, cookies, and cheesecake. °Seasonings and condiments °Spice mixes with added salt. Ketchup. Barbecue sauce. Mayonnaise. °The items listed above may not be a complete list of foods and beverages that are not recommended. Contact a dietitian for more information. °Where to find more information °American Diabetes Association: www.diabetes.org °Summary °You may need to make diet and lifestyle changes to help prevent the onset of diabetes. These changes can help you control blood sugar, improve cholesterol levels, and manage blood pressure. °Set weight loss goals with help from your health care team. It is recommended that most people with prediabetes lose 7% of their body weight. °Consider following a Mediterranean diet. This includes eating plenty of fresh fruits and vegetables, whole grains, beans, nuts, seeds, fish, and low-fat dairy, and using olive oil instead of other fats. °This information is not intended to replace advice given to you by your health care provider. Make sure you discuss any questions you have with your health care provider. °Document Revised: 11/20/2019 Document Reviewed: 11/20/2019 °Elsevier Patient Education © 2022 Elsevier Inc. ° °

## 2021-04-18 NOTE — Progress Notes (Signed)
Tiffany [name], [place]  [address] Phone:  [phone]   Fax:  [phone]   Established Patient Office Visit  Subjective:  Patient ID: Tiffany [name], female    DOB: [birth year]  Age: 40 y.o. MRN: [id number]  CC:  Chief Complaint  Patient presents with   Follow-up    6 week follow up;     HPI Tiffany [name] presents for follow up. She  has a past medical history of Appendicitis, COVID-19 (01/2019), and Medical history non-contributory.   She reports that she is taking the Mirtapzine and is doing well. She reports trying not to take it for 2 days and she was unable to sleep. So she restarted that and it was effective. She will continue with this. She has had her desired weight gain.   She has prediabetes and reports that she does eat white rice with eat meal. She denies polyphagia,  polydipsia or polyuria.   Past Medical History:  Diagnosis Date   Appendicitis    COVID-19 01/2019   Medical history non-contributory     Past Surgical History:  Procedure Laterality Date   ABDOMINAL HYSTERECTOMY N/A 07/15/2019   Procedure: HYSTERECTOMY ABDOMINAL;  Surgeon: Truett Mainland, DO;  Location: [facility];  Service: Obstetrics;  Laterality: N/A;   CESAREAN SECTION N/A 07/15/2019   Procedure: CESAREAN SECTION;  Surgeon: Truett Mainland, DO;  Location: [facility];  Service: Obstetrics;  Laterality: N/A;   LAPAROSCOPIC APPENDECTOMY N/A 04/20/2014   Procedure: APPENDECTOMY LAPAROSCOPIC;  Surgeon: Leighton Ruff, MD;  Location: [facility];  Service: General;  Laterality: N/A;   supra cervical hysterectomy N/A     Family History  Problem Relation Age of Onset   Cancer Mother    Heart disease Father     Social History   Socioeconomic History   Marital status: Single    Spouse name: Not on file   Number of children: 3   Years of education: 4    Highest education level: Not on file  Occupational History   Occupation: Unemployed     Employer: [employer]  Tobacco Use    Smoking status: Never   Smokeless tobacco: Never  Vaping Use   Vaping Use: Never used  Substance and Sexual Activity   Alcohol use: No   Drug use: No   Sexual activity: Yes    Birth control/protection: None  Other Topics Concern   Not on file  Social History Narrative   From [place]   Moved  To [place] 08/11/2010   Live with 3 children, husband, brother.    Social Determinants of Health   Financial Resource Strain: Not on file  Food Insecurity: Not on file  Transportation Needs: Not on file  Physical Activity: Not on file  Stress: Not on file  Social Connections: Not on file  Intimate Partner Violence: Not on file    Outpatient Medications Prior to Visit  Medication Sig Dispense Refill   fluticasone (FLONASE) 50 MCG/ACT nasal spray Place 2 sprays into both nostrils daily. 16 g 3   levocetirizine (XYZAL) 5 MG tablet Take 1 tablet (5 mg total) by mouth every evening. 90 tablet 3   mirtazapine (REMERON) 7.5 MG tablet Take 1 tablet (7.5 mg total) by mouth at bedtime. 30 tablet 11   ferrous sulfate 325 (65 FE) MG tablet Take 1 tablet (325 mg total) by mouth daily with breakfast. 60 tablet 0   No facility-administered medications prior to visit.  No Known Allergies  ROS Review of Systems    Objective:    Physical Exam Constitutional:      General: She is not in acute distress. HENT:     Head: Normocephalic and atraumatic.     Nose: Nose normal.     Mouth/Throat:     Mouth: Mucous membranes are moist.  Cardiovascular:     Rate and Rhythm: Normal rate.     Pulses: Normal pulses.  Pulmonary:     Effort: Pulmonary effort is normal.  Musculoskeletal:        General: Normal range of motion.     Cervical back: Normal range of motion.  Skin:    General: Skin is warm and dry.     Capillary Refill: Capillary refill takes less than 2 seconds.  Neurological:     General: No focal deficit present.     Mental Status: She is alert and oriented to person, place, and time.   Psychiatric:        Behavior: Behavior normal.        Thought Content: Thought content normal.        Judgment: Judgment normal.    BP 111/81   Pulse 95   Temp 98 F (36.7 C)   Ht _0  (1.448 m)   Wt 102 lb 0.6 oz (46.3 kg)   LMP 11/08/2018 (Exact Date)   SpO2 99%   BMI 22.08 kg/m  Wt Readings from Last 3 Encounters:  04/18/21 102 lb 0.6 oz (46.3 kg)  02/23/21 98 lb (44.5 kg)  02/03/21 98 lb (44.5 kg)     Health Maintenance Due  Topic Date Due   INFLUENZA VACCINE  04/04/2021    There are no preventive care reminders to display for this patient.  Lab Results  Component Value Date   TSH 1.740 03/26/2019   Lab Results  Component Value Date   WBC 8.7 12/23/2020   HGB 12.7 12/23/2020   HCT 37.9 12/23/2020   MCV 88 12/23/2020   PLT 312 12/23/2020   Lab Results  Component Value Date   NA 137 12/23/2020   K 4.3 12/23/2020   CO2 24 07/18/2019   GLUCOSE 225 (H) 12/23/2020   BUN 16 12/23/2020   CREATININE 0.69 12/23/2020   BILITOT <0.2 12/23/2020   ALKPHOS 95 12/23/2020   AST 16 12/23/2020   ALT 33 07/18/2019   PROT 7.6 12/23/2020   ALBUMIN 4.4 12/23/2020   CALCIUM 9.5 12/23/2020   ANIONGAP 13 07/18/2019   EGFR 112 12/23/2020   No results found for: CHOL No results found for: HDL No results found for: LDLCALC No results found for: TRIG No results found for: CHOLHDL Lab Results  Component Value Date   HGBA1C 6.1 (A) 04/18/2021      Assessment & Plan:   Problem List Items Addressed This Visit   None Visit Diagnoses     Prediabetes    -  Primary Consider home glucose monitoring Lifestyle modification dietary changes and regular daily exercise Encourage blood pressure control goal <120/80 and maintaining total cholesterol <200 Follow-up every 3 to 6 months for reevaluation Education material provided    Relevant Orders   HgB A1c (Completed)   Urinalysis Dipstick (Completed)   Appetite impaired   Improved will continue with Mirtazapine  7.5  mg qhs   Insomnia, unspecified type     Improved with Mirtazapine        No orders of the defined types were placed in this encounter.  Follow-up: Return in about 3 months (around 07/19/2021) for prediabetes [address].    Vevelyn Francois, NP

## 2021-04-19 ENCOUNTER — Encounter: Payer: Self-pay | Admitting: Nurse Practitioner

## 2021-07-20 ENCOUNTER — Ambulatory Visit: Payer: Self-pay | Admitting: Nurse Practitioner

## 2021-07-25 ENCOUNTER — Other Ambulatory Visit: Payer: Self-pay

## 2021-07-25 ENCOUNTER — Encounter: Payer: Self-pay | Admitting: Nurse Practitioner

## 2021-07-25 ENCOUNTER — Ambulatory Visit: Payer: Medicaid Other | Admitting: Nurse Practitioner

## 2021-07-25 VITALS — BP 102/75 | HR 102 | Temp 98.1°F | Ht <= 58 in | Wt 103.0 lb

## 2021-07-25 DIAGNOSIS — Z114 Encounter for screening for human immunodeficiency virus [HIV]: Secondary | ICD-10-CM

## 2021-07-25 DIAGNOSIS — Z1159 Encounter for screening for other viral diseases: Secondary | ICD-10-CM

## 2021-07-25 DIAGNOSIS — D65 Disseminated intravascular coagulation [defibrination syndrome]: Secondary | ICD-10-CM

## 2021-07-25 DIAGNOSIS — Z23 Encounter for immunization: Secondary | ICD-10-CM

## 2021-07-25 DIAGNOSIS — Z205 Contact with and (suspected) exposure to viral hepatitis: Secondary | ICD-10-CM

## 2021-07-25 DIAGNOSIS — R7303 Prediabetes: Secondary | ICD-10-CM | POA: Diagnosis not present

## 2021-07-25 LAB — POCT URINALYSIS DIP (CLINITEK)
Bilirubin, UA: NEGATIVE
Blood, UA: NEGATIVE
Glucose, UA: 500 mg/dL — AB
Ketones, POC UA: NEGATIVE mg/dL
Leukocytes, UA: NEGATIVE
Nitrite, UA: NEGATIVE
POC PROTEIN,UA: NEGATIVE
Spec Grav, UA: 1.025 (ref 1.010–1.025)
Urobilinogen, UA: 0.2 E.U./dL
pH, UA: 6 (ref 5.0–8.0)

## 2021-07-25 LAB — POCT GLYCOSYLATED HEMOGLOBIN (HGB A1C)
HbA1c POC (<> result, manual entry): 6 % (ref 4.0–5.6)
HbA1c, POC (controlled diabetic range): 6 % (ref 0.0–7.0)
HbA1c, POC (prediabetic range): 6 % (ref 5.7–6.4)
Hemoglobin A1C: 6 % — AB (ref 4.0–5.6)

## 2021-07-25 NOTE — Patient Instructions (Signed)
Prediabetes Eating Plan °Prediabetes is a condition that causes blood sugar (glucose) levels to be higher than normal. This increases the risk for developing type 2 diabetes (type 2 diabetes mellitus). Working with a health care provider or nutrition specialist (dietitian) to make diet and lifestyle changes can help prevent the onset of diabetes. These changes may help you: °Control your blood glucose levels. °Improve your cholesterol levels. °Manage your blood pressure. °What are tips for following this plan? °Reading food labels °Read food labels to check the amount of fat, salt (sodium), and sugar in prepackaged foods. Avoid foods that have: °Saturated fats. °Trans fats. °Added sugars. °Avoid foods that have more than 300 milligrams (mg) of sodium per serving. Limit your sodium intake to less than 2,300 mg each day. °Shopping °Avoid buying pre-made and processed foods. °Avoid buying drinks with added sugar. °Cooking °Cook with olive oil. Do not use butter, lard, or ghee. °Bake, broil, grill, steam, or boil foods. Avoid frying. °Meal planning ° °Work with your dietitian to create an eating plan that is right for you. This may include tracking how many calories you take in each day. Use a food diary, notebook, or mobile application to track what you eat at each meal. °Consider following a Mediterranean diet. This includes: °Eating several servings of fresh fruits and vegetables each day. °Eating fish at least twice a week. °Eating one serving each day of whole grains, beans, nuts, and seeds. °Using olive oil instead of other fats. °Limiting alcohol. °Limiting red meat. °Using nonfat or low-fat dairy products. °Consider following a plant-based diet. This includes dietary choices that focus on eating mostly vegetables and fruit, grains, beans, nuts, and seeds. °If you have high blood pressure, you may need to limit your sodium intake or follow a diet such as the DASH (Dietary Approaches to Stop Hypertension) eating  plan. The DASH diet aims to lower high blood pressure. °Lifestyle °Set weight loss goals with help from your health care team. It is recommended that most people with prediabetes lose 7% of their body weight. °Exercise for at least 30 minutes 5 or more days a week. °Attend a support group or seek support from a mental health counselor. °Take over-the-counter and prescription medicines only as told by your health care provider. °What foods are recommended? °Fruits °Berries. Bananas. Apples. Oranges. Grapes. Papaya. Mango. Pomegranate. Kiwi. Grapefruit. Cherries. °Vegetables °Lettuce. Spinach. Peas. Beets. Cauliflower. Cabbage. Broccoli. Carrots. Tomatoes. Squash. Eggplant. Herbs. Peppers. Onions. Cucumbers. Brussels sprouts. °Grains °Whole grains, such as whole-wheat or whole-grain breads, crackers, cereals, and pasta. Unsweetened oatmeal. Bulgur. Barley. Quinoa. Brown rice. Corn or whole-wheat flour tortillas or taco shells. °Meats and other proteins °Seafood. Poultry without skin. Lean cuts of pork and beef. Tofu. Eggs. Nuts. Beans. °Dairy °Low-fat or fat-free dairy products, such as yogurt, cottage cheese, and cheese. °Beverages °Water. Tea. Coffee. Sugar-free or diet soda. Seltzer water. Low-fat or nonfat milk. Milk alternatives, such as soy or almond milk. °Fats and oils °Olive oil. Canola oil. Sunflower oil. Grapeseed oil. Avocado. Walnuts. °Sweets and desserts °Sugar-free or low-fat pudding. Sugar-free or low-fat ice cream and other frozen treats. °Seasonings and condiments °Herbs. Sodium-free spices. Mustard. Relish. Low-salt, low-sugar ketchup. Low-salt, low-sugar barbecue sauce. Low-fat or fat-free mayonnaise. °The items listed above may not be a complete list of recommended foods and beverages. Contact a dietitian for more information. °What foods are not recommended? °Fruits °Fruits canned with syrup. °Vegetables °Canned vegetables. Frozen vegetables with butter or cream sauce. °Grains °Refined white  flour and flour   products, such as bread, pasta, snack foods, and cereals. °Meats and other proteins °Fatty cuts of meat. Poultry with skin. Breaded or fried meat. Processed meats. °Dairy °Full-fat yogurt, cheese, or milk. °Beverages °Sweetened drinks, such as iced tea and soda. °Fats and oils °Butter. Lard. Ghee. °Sweets and desserts °Baked goods, such as cake, cupcakes, pastries, cookies, and cheesecake. °Seasonings and condiments °Spice mixes with added salt. Ketchup. Barbecue sauce. Mayonnaise. °The items listed above may not be a complete list of foods and beverages that are not recommended. Contact a dietitian for more information. °Where to find more information °American Diabetes Association: www.diabetes.org °Summary °You may need to make diet and lifestyle changes to help prevent the onset of diabetes. These changes can help you control blood sugar, improve cholesterol levels, and manage blood pressure. °Set weight loss goals with help from your health care team. It is recommended that most people with prediabetes lose 7% of their body weight. °Consider following a Mediterranean diet. This includes eating plenty of fresh fruits and vegetables, whole grains, beans, nuts, seeds, fish, and low-fat dairy, and using olive oil instead of other fats. °This information is not intended to replace advice given to you by your health care provider. Make sure you discuss any questions you have with your health care provider. °Document Revised: 11/20/2019 Document Reviewed: 11/20/2019 °Elsevier Patient Education © 2022 Elsevier Inc. ° °

## 2021-07-25 NOTE — Progress Notes (Signed)
[place], [place]  [address] Phone:  [phone]   Fax:  [phone]   Established Patient Office Visit  Subjective:  Patient ID: Tiffany [name], female    DOB: 1981/04/08  Age: 40 y.o. MRN: [id number]  CC:  Chief Complaint  Patient presents with   Follow-up    Pt has a language barrier and she speaks [place] Glad. Pt is here today for her follow up visit.     HPI Tiffany [name] presents for follow up She  has a past medical history of Appendicitis, COVID-19 (01/2019), and Medical history non-contributory.   She is taking the mirtazapine 7.5 mg.  She reports that she has an increased appetite and is resting well at night . She has gained 4 pounds. She has a diagnoses of prediabetes. She continues to eat brown rice and vegetables. She is doing well overall just desires to keep her A1c below 6.5%. Denies headache, dizziness, visual changes, shortness of breath, dyspnea on exertion, chest pain, nausea, vomiting or any edema.   She reports exposure to Hep B from her spouse who is currently receiving treatment .  Past Medical History:  Diagnosis Date   Appendicitis    COVID-19 01/2019   Medical history non-contributory     Past Surgical History:  Procedure Laterality Date   ABDOMINAL HYSTERECTOMY N/A 07/15/2019   Procedure: HYSTERECTOMY ABDOMINAL;  Surgeon: Truett Mainland, DO;  Location: [facility];  Service: Obstetrics;  Laterality: N/A;   CESAREAN SECTION N/A 07/15/2019   Procedure: CESAREAN SECTION;  Surgeon: Truett Mainland, DO;  Location: [facility];  Service: Obstetrics;  Laterality: N/A;   LAPAROSCOPIC APPENDECTOMY N/A 04/20/2014   Procedure: APPENDECTOMY LAPAROSCOPIC;  Surgeon: Leighton Ruff, MD;  Location: [facility];  Service: General;  Laterality: N/A;   supra cervical hysterectomy N/A     Family History  Problem Relation Age of Onset   Cancer Mother    Heart disease Father     Social History   Socioeconomic History   Marital status:  Single    Spouse name: Not on file   Number of children: 3   Years of education: 4    Highest education level: Not on file  Occupational History   Occupation: Unemployed     Employer: [employer]  Tobacco Use   Smoking status: Never   Smokeless tobacco: Never  Vaping Use   Vaping Use: Never used  Substance and Sexual Activity   Alcohol use: No   Drug use: No   Sexual activity: Yes    Birth control/protection: None  Other Topics Concern   Not on file  Social History Narrative   From [place]   Moved  To [place] 08/11/2010   Live with 3 children, husband, brother.    Social Determinants of Health   Financial Resource Strain: Not on file  Food Insecurity: Not on file  Transportation Needs: Not on file  Physical Activity: Not on file  Stress: Not on file  Social Connections: Not on file  Intimate Partner Violence: Not on file    Outpatient Medications Prior to Visit  Medication Sig Dispense Refill   fluticasone (FLONASE) 50 MCG/ACT nasal spray Place 2 sprays into both nostrils daily. 16 g 3   levocetirizine (XYZAL) 5 MG tablet Take 1 tablet (5 mg total) by mouth every evening. 90 tablet 3   mirtazapine (REMERON) 7.5 MG tablet Take 1 tablet (7.5 mg total) by mouth at bedtime. 30 tablet 11  ferrous sulfate 325 (65 FE) MG tablet Take 1 tablet (325 mg total) by mouth daily with breakfast. 60 tablet 0   No facility-administered medications prior to visit.    No Known Allergies  ROS Review of Systems    Objective:    Physical Exam Constitutional:      General: She is not in acute distress. HENT:     Head: Normocephalic and atraumatic.     Nose: Nose normal.     Mouth/Throat:     Mouth: Mucous membranes are moist.  Cardiovascular:     Rate and Rhythm: Normal rate.     Pulses: Normal pulses.  Pulmonary:     Effort: Pulmonary effort is normal.  Musculoskeletal:        General: Normal range of motion.     Cervical back: Normal range of motion.  Skin:    General: Skin is  warm and dry.     Capillary Refill: Capillary refill takes less than 2 seconds.  Neurological:     General: No focal deficit present.     Mental Status: She is alert and oriented to person, place, and time.  Psychiatric:        Behavior: Behavior normal.        Thought Content: Thought content normal.        Judgment: Judgment normal.    BP 102/75   Pulse (!) 102   Temp 98.1 F (36.7 C)   Ht $R'4\' 9"'ja$  (1.448 m)   Wt 103 lb (46.7 kg)   LMP 11/08/2018 (Exact Date)   SpO2 99%   BMI 22.29 kg/m  Wt Readings from Last 3 Encounters:  07/25/21 103 lb (46.7 kg)  04/18/21 102 lb 0.6 oz (46.3 kg)  02/23/21 98 lb (44.5 kg)     There are no preventive care reminders to display for this patient.   There are no preventive care reminders to display for this patient.  Lab Results  Component Value Date   TSH 1.740 03/26/2019   Lab Results  Component Value Date   WBC 8.7 12/23/2020   HGB 12.7 12/23/2020   HCT 37.9 12/23/2020   MCV 88 12/23/2020   PLT 312 12/23/2020   Lab Results  Component Value Date   NA 137 12/23/2020   K 4.3 12/23/2020   CO2 24 07/18/2019   GLUCOSE 225 (H) 12/23/2020   BUN 16 12/23/2020   CREATININE 0.69 12/23/2020   BILITOT <0.2 12/23/2020   ALKPHOS 95 12/23/2020   AST 16 12/23/2020   ALT 33 07/18/2019   PROT 7.6 12/23/2020   ALBUMIN 4.4 12/23/2020   CALCIUM 9.5 12/23/2020   ANIONGAP 13 07/18/2019   EGFR 112 12/23/2020   No results found for: CHOL No results found for: HDL No results found for: LDLCALC No results found for: TRIG No results found for: CHOLHDL Lab Results  Component Value Date   HGBA1C 6.0 (A) 07/25/2021   HGBA1C 6.0 07/25/2021   HGBA1C 6.0 07/25/2021   HGBA1C 6.0 07/25/2021      Assessment & Plan:   Problem List Items Addressed This Visit   None Visit Diagnoses     Prediabetes    -  Primary Continues lifestyle modification dietary changes and regular daily exercise Encourage blood pressure control goal <120/80 and  maintaining total cholesterol <200 Follow-up every 3 to 6 months for reevaluation Education material provided    Relevant Orders   HgB A1c (Completed)   POCT URINALYSIS DIP (CLINITEK) (Completed)   Encounter for  hepatitis C screening test for low risk patient       Relevant Orders   Acute Viral Hepatitis (HAV, HBV, HCV)   Exposure to hepatitis B       Relevant Orders   Acute Viral Hepatitis (HAV, HBV, HCV)   Screening for HIV (human immunodeficiency virus)       Relevant Orders   HIV antibody (with reflex)   Disseminated intravascular coagulation (defibrination syndrome) (HCC)   (Chronic)         No orders of the defined types were placed in this encounter.   Follow-up: Return in about 3 months (around 10/25/2021) for Prediabetes.    Vevelyn Francois, NP

## 2021-07-26 ENCOUNTER — Encounter: Payer: Self-pay | Admitting: Nurse Practitioner

## 2021-07-26 LAB — ACUTE VIRAL HEPATITIS (HAV, HBV, HCV)
HCV Ab: <0.1 s/co ratio (ref 0.0–0.9)
Hep A IgM: NEGATIVE
Hep B C IgM: NEGATIVE
Hepatitis B Surface Ag: NEGATIVE

## 2021-07-26 LAB — HCV INTERPRETATION

## 2021-07-26 LAB — HIV ANTIBODY (ROUTINE TESTING W REFLEX): HIV Screen 4th Generation wRfx: NONREACTIVE

## 2021-10-27 ENCOUNTER — Encounter: Payer: Self-pay | Admitting: Nurse Practitioner

## 2021-10-27 ENCOUNTER — Ambulatory Visit (INDEPENDENT_AMBULATORY_CARE_PROVIDER_SITE_OTHER): Payer: Medicaid Other | Admitting: Nurse Practitioner

## 2021-10-27 ENCOUNTER — Other Ambulatory Visit: Payer: Self-pay

## 2021-10-27 VITALS — BP 104/75 | HR 85 | Temp 98.1°F | Ht <= 58 in | Wt 106.0 lb

## 2021-10-27 DIAGNOSIS — R7303 Prediabetes: Secondary | ICD-10-CM

## 2021-10-27 DIAGNOSIS — R630 Anorexia: Secondary | ICD-10-CM | POA: Diagnosis not present

## 2021-10-27 DIAGNOSIS — Z5320 Procedure and treatment not carried out because of patient's decision for unspecified reasons: Secondary | ICD-10-CM

## 2021-10-27 LAB — GLUCOSE, POCT (MANUAL RESULT ENTRY): POC Glucose: 155 mg/dl — AB (ref 70–99)

## 2021-10-27 NOTE — Patient Instructions (Signed)
Prediabetes Eating Plan °Prediabetes is a condition that causes blood sugar (glucose) levels to be higher than normal. This increases the risk for developing type 2 diabetes (type 2 diabetes mellitus). Working with a health care provider or nutrition specialist (dietitian) to make diet and lifestyle changes can help prevent the onset of diabetes. These changes may help you: °Control your blood glucose levels. °Improve your cholesterol levels. °Manage your blood pressure. °What are tips for following this plan? °Reading food labels °Read food labels to check the amount of fat, salt (sodium), and sugar in prepackaged foods. Avoid foods that have: °Saturated fats. °Trans fats. °Added sugars. °Avoid foods that have more than 300 milligrams (mg) of sodium per serving. Limit your sodium intake to less than 2,300 mg each day. °Shopping °Avoid buying pre-made and processed foods. °Avoid buying drinks with added sugar. °Cooking °Cook with olive oil. Do not use butter, lard, or ghee. °Bake, broil, grill, steam, or boil foods. Avoid frying. °Meal planning ° °Work with your dietitian to create an eating plan that is right for you. This may include tracking how many calories you take in each day. Use a food diary, notebook, or mobile application to track what you eat at each meal. °Consider following a Mediterranean diet. This includes: °Eating several servings of fresh fruits and vegetables each day. °Eating fish at least twice a week. °Eating one serving each day of whole grains, beans, nuts, and seeds. °Using olive oil instead of other fats. °Limiting alcohol. °Limiting red meat. °Using nonfat or low-fat dairy products. °Consider following a plant-based diet. This includes dietary choices that focus on eating mostly vegetables and fruit, grains, beans, nuts, and seeds. °If you have high blood pressure, you may need to limit your sodium intake or follow a diet such as the DASH (Dietary Approaches to Stop Hypertension) eating  plan. The DASH diet aims to lower high blood pressure. °Lifestyle °Set weight loss goals with help from your health care team. It is recommended that most people with prediabetes lose 7% of their body weight. °Exercise for at least 30 minutes 5 or more days a week. °Attend a support group or seek support from a mental health counselor. °Take over-the-counter and prescription medicines only as told by your health care provider. °What foods are recommended? °Fruits °Berries. Bananas. Apples. Oranges. Grapes. Papaya. Mango. Pomegranate. Kiwi. Grapefruit. Cherries. °Vegetables °Lettuce. Spinach. Peas. Beets. Cauliflower. Cabbage. Broccoli. Carrots. Tomatoes. Squash. Eggplant. Herbs. Peppers. Onions. Cucumbers. Brussels sprouts. °Grains °Whole grains, such as whole-wheat or whole-grain breads, crackers, cereals, and pasta. Unsweetened oatmeal. Bulgur. Barley. Quinoa. Brown rice. Corn or whole-wheat flour tortillas or taco shells. °Meats and other proteins °Seafood. Poultry without skin. Lean cuts of pork and beef. Tofu. Eggs. Nuts. Beans. °Dairy °Low-fat or fat-free dairy products, such as yogurt, cottage cheese, and cheese. °Beverages °Water. Tea. Coffee. Sugar-free or diet soda. Seltzer water. Low-fat or nonfat milk. Milk alternatives, such as soy or almond milk. °Fats and oils °Olive oil. Canola oil. Sunflower oil. Grapeseed oil. Avocado. Walnuts. °Sweets and desserts °Sugar-free or low-fat pudding. Sugar-free or low-fat ice cream and other frozen treats. °Seasonings and condiments °Herbs. Sodium-free spices. Mustard. Relish. Low-salt, low-sugar ketchup. Low-salt, low-sugar barbecue sauce. Low-fat or fat-free mayonnaise. °The items listed above may not be a complete list of recommended foods and beverages. Contact a dietitian for more information. °What foods are not recommended? °Fruits °Fruits canned with syrup. °Vegetables °Canned vegetables. Frozen vegetables with butter or cream sauce. °Grains °Refined white  flour and flour   products, such as bread, pasta, snack foods, and cereals. °Meats and other proteins °Fatty cuts of meat. Poultry with skin. Breaded or fried meat. Processed meats. °Dairy °Full-fat yogurt, cheese, or milk. °Beverages °Sweetened drinks, such as iced tea and soda. °Fats and oils °Butter. Lard. Ghee. °Sweets and desserts °Baked goods, such as cake, cupcakes, pastries, cookies, and cheesecake. °Seasonings and condiments °Spice mixes with added salt. Ketchup. Barbecue sauce. Mayonnaise. °The items listed above may not be a complete list of foods and beverages that are not recommended. Contact a dietitian for more information. °Where to find more information °American Diabetes Association: www.diabetes.org °Summary °You may need to make diet and lifestyle changes to help prevent the onset of diabetes. These changes can help you control blood sugar, improve cholesterol levels, and manage blood pressure. °Set weight loss goals with help from your health care team. It is recommended that most people with prediabetes lose 7% of their body weight. °Consider following a Mediterranean diet. This includes eating plenty of fresh fruits and vegetables, whole grains, beans, nuts, seeds, fish, and low-fat dairy, and using olive oil instead of other fats. °This information is not intended to replace advice given to you by your health care provider. Make sure you discuss any questions you have with your health care provider. °Document Revised: 11/20/2019 Document Reviewed: 11/20/2019 °Elsevier Patient Education © 2022 Elsevier Inc. ° °

## 2021-10-27 NOTE — Progress Notes (Signed)
[facility] [place], [place]  [address] Phone:  [phone]   Fax:  [phone]   Established Patient Office Visit  Subjective:  Patient ID: Tiffany [name], female    DOB: 10-24-1980  Age: 41 y.o. MRN: [id number]  CC:  Chief Complaint  Patient presents with   Follow-up    Pt is here for 3 month follow up. No issues or concerns    HPI Tiffany [name] presents for follow up. She  has a past medical history of Appendicitis, COVID-19 (01/2019), and Medical history non-contributory.   She is in today for following up. She is taking the Mirtazapine 7.5 mg for his appetite. She has gained 3 pounds. She is eating well however not eating foods that can negatively affect her prediabetes. Denies fever, chills, headache, dizziness, visual changes, polydipsia, polyphagia shortness of breath, dyspnea on exertion, chest pain, nausea, vomiting, polyuria, constipation, diarrhea, or any edema.    Past Medical History:  Diagnosis Date   Appendicitis    COVID-19 01/2019   Medical history non-contributory     Past Surgical History:  Procedure Laterality Date   ABDOMINAL HYSTERECTOMY N/A 07/15/2019   Procedure: HYSTERECTOMY ABDOMINAL;  Surgeon: Truett Mainland, DO;  Location: [facility];  Service: Obstetrics;  Laterality: N/A;   CESAREAN SECTION N/A 07/15/2019   Procedure: CESAREAN SECTION;  Surgeon: Truett Mainland, DO;  Location: [facility];  Service: Obstetrics;  Laterality: N/A;   LAPAROSCOPIC APPENDECTOMY N/A 04/20/2014   Procedure: APPENDECTOMY LAPAROSCOPIC;  Surgeon: Leighton Ruff, MD;  Location: [facility];  Service: General;  Laterality: N/A;   supra cervical hysterectomy N/A     Family History  Problem Relation Age of Onset   Cancer Mother    Heart disease Father     Social History   Socioeconomic History   Marital status: Single    Spouse name: Not on file   Number of children: 3   Years of education: 4    Highest education level: Not on file  Occupational  History   Occupation: Unemployed     Employer: [employer]  Tobacco Use   Smoking status: Never   Smokeless tobacco: Never  Vaping Use   Vaping Use: Never used  Substance and Sexual Activity   Alcohol use: No   Drug use: No   Sexual activity: Yes    Birth control/protection: None  Other Topics Concern   Not on file  Social History Narrative   From [place]   Moved  To [place] 08/11/2010   Live with 3 children, husband, brother.    Social Determinants of Health   Financial Resource Strain: Not on file  Food Insecurity: Not on file  Transportation Needs: Not on file  Physical Activity: Not on file  Stress: Not on file  Social Connections: Not on file  Intimate Partner Violence: Not on file    Outpatient Medications Prior to Visit  Medication Sig Dispense Refill   fluticasone (FLONASE) 50 MCG/ACT nasal spray Place 2 sprays into both nostrils daily. 16 g 3   levocetirizine (XYZAL) 5 MG tablet Take 1 tablet (5 mg total) by mouth every evening. 90 tablet 3   ferrous sulfate 325 (65 FE) MG tablet Take 1 tablet (325 mg total) by mouth daily with breakfast. 60 tablet 0   mirtazapine (REMERON) 7.5 MG tablet Take 1 tablet (7.5 mg total) by mouth at bedtime. (Patient not taking: Reported on 10/27/2021) 30 tablet 11   No facility-administered medications  prior to visit.    No Known Allergies  ROS Review of Systems    Objective:    Physical Exam Constitutional:      Appearance: She is normal weight.  HENT:     Head: Normocephalic and atraumatic.     Nose: Nose normal.     Mouth/Throat:     Mouth: Mucous membranes are moist.  Cardiovascular:     Rate and Rhythm: Normal rate and regular rhythm.     Pulses: Normal pulses.     Heart sounds: Normal heart sounds.  Pulmonary:     Effort: Pulmonary effort is normal.     Breath sounds: Normal breath sounds.  Abdominal:     Palpations: Abdomen is soft.  Musculoskeletal:        General: Normal range of motion.     Cervical back: Normal  range of motion.  Skin:    General: Skin is warm.     Capillary Refill: Capillary refill takes less than 2 seconds.  Neurological:     General: No focal deficit present.     Mental Status: She is alert and oriented to person, place, and time.  Psychiatric:        Mood and Affect: Mood normal.        Behavior: Behavior normal.        Thought Content: Thought content normal.        Judgment: Judgment normal.   BP 104/75    Pulse 85    Temp 98.1 F (36.7 C)    Ht 4' 9"  (1.448 m)    Wt 106 lb 0.6 oz (48.1 kg)    LMP 11/08/2018 (Exact Date)    SpO2 98%    BMI 22.95 kg/m  Wt Readings from Last 3 Encounters:  10/27/21 106 lb 0.6 oz (48.1 kg)  07/25/21 103 lb (46.7 kg)  04/18/21 102 lb 0.6 oz (46.3 kg)     There are no preventive care reminders to display for this patient.  There are no preventive care reminders to display for this patient.  Lab Results  Component Value Date   TSH 1.740 03/26/2019   Lab Results  Component Value Date   WBC 8.7 12/23/2020   HGB 12.7 12/23/2020   HCT 37.9 12/23/2020   MCV 88 12/23/2020   PLT 312 12/23/2020   Lab Results  Component Value Date   NA 137 12/23/2020   K 4.3 12/23/2020   CO2 24 07/18/2019   GLUCOSE 225 (H) 12/23/2020   BUN 16 12/23/2020   CREATININE 0.69 12/23/2020   BILITOT <0.2 12/23/2020   ALKPHOS 95 12/23/2020   AST 16 12/23/2020   ALT 33 07/18/2019   PROT 7.6 12/23/2020   ALBUMIN 4.4 12/23/2020   CALCIUM 9.5 12/23/2020   ANIONGAP 13 07/18/2019   EGFR 112 12/23/2020   No results found for: CHOL No results found for: HDL No results found for: LDLCALC No results found for: TRIG No results found for: CHOLHDL Lab Results  Component Value Date   HGBA1C 6.0 (A) 07/25/2021   HGBA1C 6.0 07/25/2021   HGBA1C 6.0 07/25/2021   HGBA1C 6.0 07/25/2021      Assessment & Plan:   Problem List Items Addressed This Visit   None Visit Diagnoses     Prediabetes    -  Primary Consider home glucose monitoring Weight loss at  least 5% of current body weight is can be achieved with lifestyle modification dietary changes and regular daily exercise Encourage blood pressure control  goal <120/80 and maintaining total cholesterol <200 Follow-up every 3 to 6 months for reevaluation Education material provided    Relevant Orders   Glucose (CBG) (Completed)   Appetite impaired     Improved continue with mirtazapine   Blood test declined           No orders of the defined types were placed in this encounter.   Follow-up: Return in about 6 months (around 04/26/2022) for follow up medication management & prediabetes [address].    Vevelyn Francois, NP

## 2022-01-05 ENCOUNTER — Telehealth: Payer: Self-pay

## 2022-01-05 NOTE — Telephone Encounter (Signed)
Mirtazapine 7.5 mg tablets ?

## 2022-01-06 ENCOUNTER — Telehealth: Payer: Self-pay

## 2022-01-06 ENCOUNTER — Other Ambulatory Visit: Payer: Self-pay | Admitting: Nurse Practitioner

## 2022-01-06 MED ORDER — MIRTAZAPINE 7.5 MG PO TABS: 7.5000 mg | ORAL_TABLET | Freq: Every day | ORAL | 11 refills | Status: DC

## 2022-01-09 NOTE — Telephone Encounter (Signed)
No additional notes needed  

## 2022-04-26 ENCOUNTER — Encounter: Payer: Self-pay | Admitting: Nurse Practitioner

## 2022-04-26 ENCOUNTER — Ambulatory Visit (INDEPENDENT_AMBULATORY_CARE_PROVIDER_SITE_OTHER): Payer: Medicaid Other | Admitting: Nurse Practitioner

## 2022-04-26 ENCOUNTER — Ambulatory Visit: Payer: Self-pay | Admitting: Nurse Practitioner

## 2022-04-26 VITALS — BP 123/89 | HR 87 | Temp 97.8°F | Ht <= 58 in | Wt 104.2 lb

## 2022-04-26 DIAGNOSIS — R630 Anorexia: Secondary | ICD-10-CM

## 2022-04-26 DIAGNOSIS — J301 Allergic rhinitis due to pollen: Secondary | ICD-10-CM | POA: Diagnosis not present

## 2022-04-26 DIAGNOSIS — Z0000 Encounter for general adult medical examination without abnormal findings: Secondary | ICD-10-CM | POA: Diagnosis not present

## 2022-04-26 DIAGNOSIS — O99013 Anemia complicating pregnancy, third trimester: Secondary | ICD-10-CM | POA: Diagnosis not present

## 2022-04-26 MED ORDER — LEVOCETIRIZINE DIHYDROCHLORIDE 5 MG PO TABS: 5.0000 mg | ORAL_TABLET | Freq: Every evening | ORAL | 3 refills | Status: DC

## 2022-04-26 MED ORDER — MIRTAZAPINE 7.5 MG PO TABS: 7.5000 mg | ORAL_TABLET | Freq: Every day | ORAL | 11 refills | Status: DC

## 2022-04-26 MED ORDER — FLUTICASONE PROPIONATE 50 MCG/ACT NA SUSP: 2.0000 | Freq: Every day | NASAL | 3 refills | Status: DC

## 2022-04-26 NOTE — Progress Notes (Signed)
@Patient  ID: , female    DOB: [birth year], 41 y.o.   MRN: [id number]  Chief Complaint  Patient presents with   Follow-up    Referring provider: [id number], NP   HPI  [facility] [place] presents for follow up. She  has a past medical history of Appendicitis, COVID-19 (01/2019), and Medical history non-contributory.   Patient presents today for routine follow up. She states that overall she is doing well. No new issues or concerns today. Denies f/c/s, n/v/d, hemoptysis, PND, leg swelling Denies chest pain or edema    No Known Allergies  Immunization History  Administered Date(s) Administered   Influenza,inj,Quad PF,6+ Mos 06/29/2016, 05/19/2019, 06/02/2020, 07/25/2021   Tdap 04/17/2019    Past Medical History:  Diagnosis Date   Appendicitis    COVID-19 01/2019   Medical history non-contributory     Tobacco History: Social History   Tobacco Use  Smoking Status Never  Smokeless Tobacco Never   Counseling given: Not Answered   Outpatient Encounter Medications as of 04/26/2022  Medication Sig   [DISCONTINUED] fluticasone (FLONASE) 50 MCG/ACT nasal spray Place 2 sprays into both nostrils daily.   [DISCONTINUED] mirtazapine (REMERON) 7.5 MG tablet Take 1 tablet (7.5 mg total) by mouth at bedtime.   ferrous sulfate 325 (65 FE) MG tablet Take 1 tablet (325 mg total) by mouth daily with breakfast.   fluticasone (FLONASE) 50 MCG/ACT nasal spray Place 2 sprays into both nostrils daily.   levocetirizine (XYZAL) 5 MG tablet Take 1 tablet (5 mg total) by mouth every evening.   mirtazapine (REMERON) 7.5 MG tablet Take 1 tablet (7.5 mg total) by mouth at bedtime.   [DISCONTINUED] levocetirizine (XYZAL) 5 MG tablet Take 1 tablet (5 mg total) by mouth every evening.   No facility-administered encounter medications on file as of 04/26/2022.     Review of Systems  Review of Systems  Constitutional: Negative.   HENT: Negative.    Cardiovascular: Negative.   Gastrointestinal:  Negative.   Allergic/Immunologic: Negative.   Neurological: Negative.   Psychiatric/Behavioral: Negative.         Physical Exam  BP 123/89 (BP Location: Right Arm, Patient Position: Sitting, Cuff Size: Normal)   Pulse 87   Temp 97.8 F (36.6 C)   Ht 4\' 9"  (1.448 m)   Wt 104 lb 3 oz (47.3 kg)   LMP 11/08/2018 (Exact Date)   SpO2 100%   BMI 22.55 kg/m   Wt Readings from Last 5 Encounters:  04/26/22 104 lb 3 oz (47.3 kg)  10/27/21 106 lb 0.6 oz (48.1 kg)  07/25/21 103 lb (46.7 kg)  04/18/21 102 lb 0.6 oz (46.3 kg)  02/23/21 98 lb (44.5 kg)     Physical Exam   Lab Results:  CBC    Component Value Date/Time   WBC 8.7 12/23/2020 1216   WBC 13.4 (H) 07/18/2019 0538   RBC 4.29 12/23/2020 1216   RBC 3.34 (L) 07/18/2019 0538   HGB 12.7 12/23/2020 1216   HCT 37.9 12/23/2020 1216   PLT 312 12/23/2020 1216   MCV 88 12/23/2020 1216   MCH 29.6 12/23/2020 1216   MCH 29.0 07/18/2019 0538   MCHC 33.5 12/23/2020 1216   MCHC 34.3 07/18/2019 0538   RDW 13.1 12/23/2020 1216   LYMPHSABS 3.2 (H) 12/23/2020 1216   MONOABS 0.8 07/16/2019 0526   EOSABS 0.4 12/23/2020 1216   BASOSABS 0.1 12/23/2020 1216    BMET    Component Value Date/Time   NA 137 12/23/2020 1216  K 4.3 12/23/2020 1216   CL 98 12/23/2020 1216   CO2 24 07/18/2019 0538   GLUCOSE 225 (H) 12/23/2020 1216   GLUCOSE 78 07/18/2019 0538   BUN 16 12/23/2020 1216   CREATININE 0.69 12/23/2020 1216   CALCIUM 9.5 12/23/2020 1216   GFRNONAA >60 07/18/2019 0538   GFRAA >60 07/18/2019 0538      Assessment & Plan:   Healthcare maintenance - CBC - Comprehensive metabolic panel - Lipid Panel  2. Anemia during pregnancy in third trimester  resolved  3. Appetite impaired  - mirtazapine (REMERON) 7.5 MG tablet; Take 1 tablet (7.5 mg total) by mouth at bedtime.  Dispense: 30 tablet; Refill: 11  4. Seasonal allergic rhinitis due to pollen  - fluticasone (FLONASE) 50 MCG/ACT nasal spray; Place 2 sprays into  both nostrils daily.  Dispense: 16 g; Refill: 3 - levocetirizine (XYZAL) 5 MG tablet; Take 1 tablet (5 mg total) by mouth every evening.  Dispense: 90 tablet; Refill: 3  Follow up:  Follow up in 1 year or sooner if needed     Ivonne Andrew, NP 04/26/2022

## 2022-04-26 NOTE — Patient Instructions (Signed)
1. Healthcare maintenance  - CBC - Comprehensive metabolic panel - Lipid Panel  2. Anemia during pregnancy in third trimester  resolved  3. Appetite impaired  - mirtazapine (REMERON) 7.5 MG tablet; Take 1 tablet (7.5 mg total) by mouth at bedtime.  Dispense: 30 tablet; Refill: 11  4. Seasonal allergic rhinitis due to pollen  - fluticasone (FLONASE) 50 MCG/ACT nasal spray; Place 2 sprays into both nostrils daily.  Dispense: 16 g; Refill: 3 - levocetirizine (XYZAL) 5 MG tablet; Take 1 tablet (5 mg total) by mouth every evening.  Dispense: 90 tablet; Refill: 3  Follow up:  Follow up in 1 year or sooner if needed

## 2022-04-26 NOTE — Assessment & Plan Note (Signed)
-   CBC - Comprehensive metabolic panel - Lipid Panel  2. Anemia during pregnancy in third trimester  resolved  3. Appetite impaired  - mirtazapine (REMERON) 7.5 MG tablet; Take 1 tablet (7.5 mg total) by mouth at bedtime.  Dispense: 30 tablet; Refill: 11  4. Seasonal allergic rhinitis due to pollen  - fluticasone (FLONASE) 50 MCG/ACT nasal spray; Place 2 sprays into both nostrils daily.  Dispense: 16 g; Refill: 3 - levocetirizine (XYZAL) 5 MG tablet; Take 1 tablet (5 mg total) by mouth every evening.  Dispense: 90 tablet; Refill: 3  Follow up:  Follow up in 1 year or sooner if needed

## 2022-04-27 ENCOUNTER — Other Ambulatory Visit: Payer: Self-pay | Admitting: Nurse Practitioner

## 2022-04-27 LAB — CBC
Hematocrit: 37.6 % (ref 34.0–46.6)
Hemoglobin: 13 g/dL (ref 11.1–15.9)
MCH: 29.2 pg (ref 26.6–33.0)
MCHC: 34.6 g/dL (ref 31.5–35.7)
MCV: 85 fL (ref 79–97)
Platelets: 327 10*3/uL (ref 150–450)
RBC: 4.45 x10E6/uL (ref 3.77–5.28)
RDW: 14 % (ref 11.7–15.4)
WBC: 6.9 10*3/uL (ref 3.4–10.8)

## 2022-04-27 LAB — COMPREHENSIVE METABOLIC PANEL
ALT: 36 IU/L — ABNORMAL HIGH (ref 0–32)
AST: 26 IU/L (ref 0–40)
Albumin/Globulin Ratio: 1.5 (ref 1.2–2.2)
Albumin: 4.6 g/dL (ref 3.9–4.9)
Alkaline Phosphatase: 114 IU/L (ref 44–121)
BUN/Creatinine Ratio: 21 (ref 9–23)
BUN: 13 mg/dL (ref 6–24)
Bilirubin Total: 0.3 mg/dL (ref 0.0–1.2)
CO2: 21 mmol/L (ref 20–29)
Calcium: 9.8 mg/dL (ref 8.7–10.2)
Chloride: 101 mmol/L (ref 96–106)
Creatinine, Ser: 0.63 mg/dL (ref 0.57–1.00)
Globulin, Total: 3 g/dL (ref 1.5–4.5)
Glucose: 96 mg/dL (ref 70–99)
Potassium: 4.3 mmol/L (ref 3.5–5.2)
Sodium: 138 mmol/L (ref 134–144)
Total Protein: 7.6 g/dL (ref 6.0–8.5)
eGFR: 114 mL/min/{1.73_m2} (ref >59)

## 2022-04-27 LAB — LIPID PANEL
Chol/HDL Ratio: 5.4 ratio — ABNORMAL HIGH (ref 0.0–4.4)
Cholesterol, Total: 255 mg/dL — ABNORMAL HIGH (ref 100–199)
HDL: 47 mg/dL (ref >39)
LDL Chol Calc (NIH): 166 mg/dL — ABNORMAL HIGH (ref 0–99)
Triglycerides: 227 mg/dL — ABNORMAL HIGH (ref 0–149)
VLDL Cholesterol Cal: 42 mg/dL — ABNORMAL HIGH (ref 5–40)

## 2022-04-27 MED ORDER — SIMVASTATIN 20 MG PO TABS: 20.0000 mg | ORAL_TABLET | Freq: Every evening | ORAL | 11 refills | Status: DC

## 2023-04-27 ENCOUNTER — Ambulatory Visit: Payer: Self-pay | Admitting: Nurse Practitioner

## 2023-05-09 ENCOUNTER — Ambulatory Visit: Payer: Self-pay | Admitting: Nurse Practitioner

## 2023-05-11 ENCOUNTER — Encounter: Payer: Self-pay | Admitting: Nurse Practitioner

## 2023-05-11 ENCOUNTER — Ambulatory Visit: Payer: Medicaid Other | Admitting: Nurse Practitioner

## 2023-05-11 VITALS — BP 112/78 | HR 84 | Temp 97.8°F

## 2023-05-11 DIAGNOSIS — R7303 Prediabetes: Secondary | ICD-10-CM | POA: Insufficient documentation

## 2023-05-11 DIAGNOSIS — Z1231 Encounter for screening mammogram for malignant neoplasm of breast: Secondary | ICD-10-CM | POA: Insufficient documentation

## 2023-05-11 DIAGNOSIS — R0602 Shortness of breath: Secondary | ICD-10-CM | POA: Insufficient documentation

## 2023-05-11 DIAGNOSIS — Z23 Encounter for immunization: Secondary | ICD-10-CM | POA: Diagnosis not present

## 2023-05-11 DIAGNOSIS — E1165 Type 2 diabetes mellitus with hyperglycemia: Secondary | ICD-10-CM

## 2023-05-11 DIAGNOSIS — E785 Hyperlipidemia, unspecified: Secondary | ICD-10-CM | POA: Diagnosis not present

## 2023-05-11 LAB — POCT GLYCOSYLATED HEMOGLOBIN (HGB A1C): Hemoglobin A1C: 7.3 % — AB (ref 4.0–5.6)

## 2023-05-11 MED ORDER — LANCETS MISC. MISC
1.0000 | Freq: Three times a day (TID) | 0 refills | Status: AC
Start: 2023-05-11 — End: 2023-06-10

## 2023-05-11 MED ORDER — BLOOD GLUCOSE MONITORING SUPPL DEVI: 1.0000 | Freq: Three times a day (TID) | 0 refills | Status: AC

## 2023-05-11 MED ORDER — LANCET DEVICE MISC
1.0000 | Freq: Three times a day (TID) | 0 refills | Status: AC
Start: 2023-05-11 — End: 2023-06-10

## 2023-05-11 MED ORDER — BLOOD GLUCOSE TEST VI STRP: 1.0000 | ORAL_STRIP | Freq: Three times a day (TID) | 0 refills | Status: AC

## 2023-05-11 MED ORDER — ATORVASTATIN CALCIUM 10 MG PO TABS: 10.0000 mg | ORAL_TABLET | Freq: Every day | ORAL | 1 refills | Status: DC

## 2023-05-11 MED ORDER — METFORMIN HCL ER 500 MG PO TB24: 500.0000 mg | ORAL_TABLET | Freq: Every day | ORAL | 0 refills | Status: DC

## 2023-05-11 NOTE — Assessment & Plan Note (Addendum)
Feels short of breath with exercise Lungs sound clear on examination, No pitting edema noted, no sign of distress noted Encouraged to discuss symptoms with her PCP at next visit due to time constraints

## 2023-05-11 NOTE — Progress Notes (Signed)
Established Patient Office Visit  Subjective:  Patient ID: Tiffany [name], female    DOB: 10/27/1980  Age: 42 y.o. MRN: [id number]  CC:  Chief Complaint  Patient presents with   Follow-up    HPI Tiffany [name] is a 42 y.o. female  has a past medical history of Appendicitis, COVID-19 (01/2019), Hyperlipidemia, Medical history non-contributory, and Prediabetes.  Patient presents for follow-up  She is currently not taking any of the  medications ordered for her  Due for cervical cancer screening had supracervical hysterectomy.  In 2020 Mammogram ordered to screen for breast cancer Flu vaccine administered in the office today   Patient complains of shortness of breath with exercises , will follow-up on this at next visit.  Currently does not have shortness of breath wheezing cough.   Interpretation services provided via [employer] iPad    Past Medical History:  Diagnosis Date   Appendicitis    COVID-19 01/2019   Hyperlipidemia    Medical history non-contributory    Prediabetes     Past Surgical History:  Procedure Laterality Date   ABDOMINAL HYSTERECTOMY N/A 07/15/2019   Procedure: HYSTERECTOMY ABDOMINAL;  Surgeon: Levie Heritage, DO;  Location: [facility];  Service: Obstetrics;  Laterality: N/A;   CESAREAN SECTION N/A 07/15/2019   Procedure: CESAREAN SECTION;  Surgeon: Levie Heritage, DO;  Location: [facility];  Service: Obstetrics;  Laterality: N/A;   LAPAROSCOPIC APPENDECTOMY N/A 04/20/2014   Procedure: APPENDECTOMY LAPAROSCOPIC;  Surgeon: Romie Levee, MD;  Location: [facility];  Service: General;  Laterality: N/A;   supra cervical hysterectomy N/A     Family History  Problem Relation Age of Onset   Cancer Mother    Heart disease Father     Social History   Socioeconomic History   Marital status: Single    Spouse name: Not on file   Number of children: 3   Years of education: 4    Highest education level: Not on file  Occupational History   Occupation: Unemployed      Employer: [employer]  Tobacco Use   Smoking status: Never   Smokeless tobacco: Never  Vaping Use   Vaping status: Never Used  Substance and Sexual Activity   Alcohol use: No   Drug use: No   Sexual activity: Yes    Birth control/protection: None  Other Topics Concern   Not on file  Social History Narrative   From [place]   Moved  To [place] 08/11/2010   Live with 3 children, husband, brother.    Social Determinants of Health   Financial Resource Strain: Not on file  Food Insecurity: Not on file  Transportation Needs: Not on file  Physical Activity: Not on file  Stress: Not on file  Social Connections: Not on file  Intimate Partner Violence: Not on file    Outpatient Medications Prior to Visit  Medication Sig Dispense Refill   ferrous sulfate 325 (65 FE) MG tablet Take 1 tablet (325 mg total) by mouth daily with breakfast. (Patient not taking: Reported on 05/11/2023) 60 tablet 0   fluticasone (FLONASE) 50 MCG/ACT nasal spray Place 2 sprays into both nostrils daily. (Patient not taking: Reported on 05/11/2023) 16 g 3   levocetirizine (XYZAL) 5 MG tablet Take 1 tablet (5 mg total) by mouth every evening. 90 tablet 3   mirtazapine (REMERON) 7.5 MG tablet Take 1 tablet (7.5 mg total) by mouth at bedtime. 30 tablet 11   simvastatin (ZOCOR) 20 MG tablet Take 1  tablet (20 mg total) by mouth every evening. 30 tablet 11   No facility-administered medications prior to visit.    No Known Allergies  ROS Review of Systems  Constitutional:  Negative for activity change, appetite change, chills, fatigue and fever.  HENT:  Negative for congestion, dental problem, ear discharge, ear pain, hearing loss, rhinorrhea, sinus pressure, sinus pain, sneezing and sore throat.   Eyes:  Negative for pain, discharge, redness and itching.  Respiratory:  Negative for cough, chest tightness, shortness of breath and wheezing.   Cardiovascular:  Negative for chest pain, palpitations and leg swelling.   Gastrointestinal:  Negative for abdominal distention, abdominal pain, anal bleeding, blood in stool, constipation and diarrhea.  Endocrine: Negative for polydipsia, polyphagia and polyuria.  Genitourinary:  Negative for difficulty urinating, dysuria, flank pain, frequency, hematuria, menstrual problem, pelvic pain and vaginal bleeding.  Musculoskeletal:  Negative for arthralgias, back pain, gait problem, joint swelling and myalgias.  Skin:  Negative for color change, pallor, rash and wound.  Neurological:  Negative for dizziness, tremors, facial asymmetry, weakness and headaches.  Hematological:  Negative for adenopathy. Does not bruise/bleed easily.  Psychiatric/Behavioral:  Negative for agitation, behavioral problems, confusion, decreased concentration, hallucinations, self-injury and suicidal ideas.       Objective:    Physical Exam Vitals and nursing note reviewed.  Constitutional:      General: She is not in acute distress.    Appearance: Normal appearance. She is not ill-appearing, toxic-appearing or diaphoretic.  HENT:     Mouth/Throat:     Mouth: Mucous membranes are moist.     Pharynx: Oropharynx is clear. No oropharyngeal exudate or posterior oropharyngeal erythema.  Eyes:     General: No scleral icterus.       Right eye: No discharge.        Left eye: No discharge.     Extraocular Movements: Extraocular movements intact.     Conjunctiva/sclera: Conjunctivae normal.  Cardiovascular:     Rate and Rhythm: Normal rate and regular rhythm.     Pulses: Normal pulses.     Heart sounds: Normal heart sounds. No murmur heard.    No friction rub. No gallop.  Pulmonary:     Effort: Pulmonary effort is normal. No respiratory distress.     Breath sounds: Normal breath sounds. No stridor. No wheezing, rhonchi or rales.  Chest:     Chest wall: No tenderness.  Abdominal:     General: There is no distension.     Palpations: Abdomen is soft.     Tenderness: There is no abdominal  tenderness. There is no right CVA tenderness, left CVA tenderness or guarding.  Musculoskeletal:        General: No swelling, tenderness, deformity or signs of injury.     Right lower leg: No edema.     Left lower leg: No edema.  Skin:    General: Skin is warm and dry.     Capillary Refill: Capillary refill takes less than 2 seconds.     Coloration: Skin is not jaundiced or pale.     Findings: No bruising, erythema or lesion.  Neurological:     Mental Status: She is alert and oriented to person, place, and time.     Motor: No weakness.     Coordination: Coordination normal.     Gait: Gait normal.  Psychiatric:        Mood and Affect: Mood normal.        Behavior: Behavior normal.  Thought Content: Thought content normal.        Judgment: Judgment normal.     BP 112/78   Pulse 84   Temp 97.8 F (36.6 C)   LMP 11/08/2018 (Exact Date)   SpO2 100%   Breastfeeding No  Wt Readings from Last 3 Encounters:  04/26/22 104 lb 3 oz (47.3 kg)  10/27/21 106 lb 0.6 oz (48.1 kg)  07/25/21 103 lb (46.7 kg)    Lab Results  Component Value Date   TSH 1.740 03/26/2019   Lab Results  Component Value Date   WBC 6.9 04/26/2022   HGB 13.0 04/26/2022   HCT 37.6 04/26/2022   MCV 85 04/26/2022   PLT 327 04/26/2022   Lab Results  Component Value Date   NA 138 04/26/2022   K 4.3 04/26/2022   CO2 21 04/26/2022   GLUCOSE 96 04/26/2022   BUN 13 04/26/2022   CREATININE 0.63 04/26/2022   BILITOT 0.3 04/26/2022   ALKPHOS 114 04/26/2022   AST 26 04/26/2022   ALT 36 (H) 04/26/2022   PROT 7.6 04/26/2022   ALBUMIN 4.6 04/26/2022   CALCIUM 9.8 04/26/2022   ANIONGAP 13 07/18/2019   EGFR 114 04/26/2022   Lab Results  Component Value Date   CHOL 255 (H) 04/26/2022   Lab Results  Component Value Date   HDL 47 04/26/2022   Lab Results  Component Value Date   LDLCALC 166 (H) 04/26/2022   Lab Results  Component Value Date   TRIG 227 (H) 04/26/2022   Lab Results  Component  Value Date   CHOLHDL 5.4 (H) 04/26/2022   Lab Results  Component Value Date   HGBA1C 7.3 (A) 05/11/2023      Assessment & Plan:   Problem List Items Addressed This Visit       Endocrine   Uncontrolled type 2 diabetes mellitus with hyperglycemia (HCC)    Lab Results  Component Value Date   HGBA1C 7.3 (A) 05/11/2023  New diagnosis of type 2 diabetes Start metformin 500 mg daily, encouraged to report nausea vomiting abdominal pain Has simvastatin ordered but has not been taking the medication will switch to atorvastatin 10 mg daily Patient counseled on low-carb modified diet, encouraged to engage in regular moderate exercises at least 150 minutes weekly as tolerated Blood pressure is normal CBG goals discussed encouraged to check blood sugar keep a log and bring to next visit       Relevant Medications   metFORMIN (GLUCOPHAGE-XR) 500 MG 24 hr tablet   atorvastatin (LIPITOR) 10 MG tablet   Blood Glucose Monitoring Suppl DEVI   Glucose Blood (BLOOD GLUCOSE TEST STRIPS) STRP   Lancet Device MISC   Lancets Misc. MISC   Other Relevant Orders   POCT glycosylated hemoglobin (Hb A1C) (Completed)     Other   Screening mammogram for breast cancer - Primary   Relevant Orders   MM Digital Screening   Need for influenza vaccination    Patient educated on CDC recommendation for the vaccine. Verbal consent was obtained from the patient, vaccine administered by nurse, no sign of adverse reactions noted at this time. Patient education on arm soreness and use of tylenol  for this patient  was discussed. Patient educated on the signs and symptoms of adverse effect and advise to contact the office if they occur.        Relevant Orders   Flu vaccine trivalent PF, 6mos and older(Flulaval,Afluria,Fluarix,Fluzone) (Completed)   Dyslipidemia, goal LDL below 70  Start atorvastatin 10 mg daily Will need repeat lipid panel in 4 to 6 weeks LDL goal is less than 70      Relevant Medications    atorvastatin (LIPITOR) 10 MG tablet   SOB (shortness of breath)    Feels short of breath with exercise Lungs sound clear on examination, No pitting edema noted, no sign of distress noted Encouraged to discuss symptoms with her PCP at next visit due to time constraints       Meds ordered this encounter  Medications   metFORMIN (GLUCOPHAGE-XR) 500 MG 24 hr tablet    Sig: Take 1 tablet (500 mg total) by mouth daily with breakfast.    Dispense:  90 tablet    Refill:  0   atorvastatin (LIPITOR) 10 MG tablet    Sig: Take 1 tablet (10 mg total) by mouth daily.    Dispense:  90 tablet    Refill:  1   Blood Glucose Monitoring Suppl DEVI    Sig: 1 each by Does not apply route in the morning, at noon, and at bedtime. May substitute to any manufacturer covered by patient's insurance. To check blood sugars    Dispense:  1 each    Refill:  0   Glucose Blood (BLOOD GLUCOSE TEST STRIPS) STRP    Sig: 1 each by In Vitro route in the morning, at noon, and at bedtime. May substitute to any manufacturer covered by patient's insurance. To check blood sugars.    Dispense:  100 strip    Refill:  0   Lancet Device MISC    Sig: 1 each by Does not apply route in the morning, at noon, and at bedtime. May substitute to any manufacturer covered by patient's insurance. To check blood sugars .    Dispense:  1 each    Refill:  0   Lancets Misc. MISC    Sig: 1 each by Does not apply route in the morning, at noon, and at bedtime. May substitute to any manufacturer covered by patient's insurance.    Dispense:  100 each    Refill:  0    Follow-up: Return in about 4 weeks (around 06/08/2023) for CPE.    Donell Beers, FNP

## 2023-05-11 NOTE — Assessment & Plan Note (Addendum)
Lab Results  Component Value Date   HGBA1C 7.3 (A) 05/11/2023  New diagnosis of type 2 diabetes Start metformin 500 mg daily, encouraged to report nausea vomiting abdominal pain Has simvastatin ordered but has not been taking the medication will switch to atorvastatin 10 mg daily Patient counseled on low-carb modified diet, encouraged to engage in regular moderate exercises at least 150 minutes weekly as tolerated Blood pressure is normal CBG goals discussed encouraged to check blood sugar keep a log and bring to next visit

## 2023-05-11 NOTE — Assessment & Plan Note (Signed)
Start atorvastatin 10 mg daily Will need repeat lipid panel in 4 to 6 weeks LDL goal is less than 70

## 2023-05-11 NOTE — Assessment & Plan Note (Signed)
Patient educated on CDC recommendation for the vaccine. Verbal consent was obtained from the patient, vaccine administered by nurse, no sign of adverse reactions noted at this time. Patient education on arm soreness and use of tylenol  for this patient  was discussed. Patient educated on the signs and symptoms of adverse effect and advise to contact the office if they occur.  ?

## 2023-05-11 NOTE — Patient Instructions (Addendum)
Please call [phone] to schedule your mammogram .  Goal for fasting blood sugar ranges from 80 to 120 and 2 hours after any meal or at bedtime should be between 130 to 170.    4. Uncontrolled type 2 diabetes mellitus with hyperglycemia (HCC)  - metFORMIN (GLUCOPHAGE-XR) 500 MG 24 hr tablet; Take 1 tablet (500 mg total) by mouth daily with breakfast.  Dispense: 90 tablet; Refill: 0 - atorvastatin (LIPITOR) 10 MG tablet; Take 1 tablet (10 mg total) by mouth daily.  Dispense: 90 tablet; Refill: 1     It is important that you exercise regularly at least 30 minutes 5 times a week as tolerated  Think about what you will eat, plan ahead. Choose " clean, green, fresh or frozen" over canned, processed or packaged foods which are more sugary, salty and fatty. 70 to 75% of food eaten should be vegetables and fruit. Three meals at set times with snacks allowed between meals, but they must be fruit or vegetables. Aim to eat over a 12 hour period , example 7 am to 7 pm, and STOP after  your last meal of the day. Drink water,generally about 64 ounces per day, no other drink is as healthy. Fruit juice is best enjoyed in a healthy way, by EATING the fruit.  Thanks for choosing [facility] we consider it a privelige to serve you.

## 2023-05-18 ENCOUNTER — Encounter: Payer: Self-pay | Admitting: Nurse Practitioner

## 2023-05-18 ENCOUNTER — Ambulatory Visit: Payer: Medicaid Other | Admitting: Nurse Practitioner

## 2023-05-18 VITALS — BP 103/68 | HR 84 | Ht <= 58 in | Wt 100.0 lb

## 2023-05-18 DIAGNOSIS — Z0000 Encounter for general adult medical examination without abnormal findings: Secondary | ICD-10-CM | POA: Diagnosis not present

## 2023-05-18 DIAGNOSIS — Z13220 Encounter for screening for lipoid disorders: Secondary | ICD-10-CM

## 2023-05-18 DIAGNOSIS — Z1329 Encounter for screening for other suspected endocrine disorder: Secondary | ICD-10-CM

## 2023-05-18 DIAGNOSIS — E1165 Type 2 diabetes mellitus with hyperglycemia: Secondary | ICD-10-CM

## 2023-05-18 NOTE — Patient Instructions (Addendum)
1. Routine adult health maintenance  - CBC - Comprehensive metabolic panel  2. Lipid screening  - Lipid Panel  3. Thyroid disorder screen  - TSH  4. Uncontrolled type 2 diabetes mellitus with hyperglycemia (HCC)  - AMB Referral to Pharmacy Medication Management   Follow up:  Follow up in 3 months

## 2023-05-18 NOTE — Progress Notes (Signed)
Subjective   Patient ID: Tiffany [name], female    DOB: [birth year], 42 y.o.   MRN: [id number]  Chief Complaint  Patient presents with   Annual Exam    Referring provider: Ivonne Andrew, NP  Tiffany [name] is a 42 y.o. female with Past Medical History: No date: Appendicitis 01/2019: COVID-19 No date: Hyperlipidemia No date: Medical history non-contributory No date: Prediabetes   HPI  Patient presents today to follow-up on diabetes and physical.  She was seen by Mitzi Davenport 2 weeks ago and was diagnosed with diabetes cholesterol.  She was started on metformin XR daily and Lipitor.  Patient states that metformin has caused her to lose her appetite.  She states that she is not eating or drinking much at all throughout the day.  We discussed that she does need to try to eat high-protein low-carb snacks.  We will have pharmacy to follow her for medication management.  She is tolerating Lipitor well. Denies f/c/s, n/v/d, hemoptysis, PND, leg swelling Denies chest pain or edema   No Known Allergies  Immunization History  Administered Date(s) Administered   Influenza, Seasonal, Injecte, Preservative Fre 05/11/2023   Influenza,inj,Quad PF,6+ Mos 06/29/2016, 05/19/2019, 06/02/2020, 07/25/2021   Tdap 04/17/2019    Tobacco History: Social History   Tobacco Use  Smoking Status Never  Smokeless Tobacco Never   Counseling given: Not Answered   Outpatient Encounter Medications as of 05/18/2023  Medication Sig   atorvastatin (LIPITOR) 10 MG tablet Take 1 tablet (10 mg total) by mouth daily.   Blood Glucose Monitoring Suppl DEVI 1 each by Does not apply route in the morning, at noon, and at bedtime. May substitute to any manufacturer covered by patient's insurance. To check blood sugars   fluticasone (FLONASE) 50 MCG/ACT nasal spray Place 2 sprays into both nostrils daily.   Glucose Blood (BLOOD GLUCOSE TEST STRIPS) STRP 1 each by In Vitro route in the morning, at noon, and at bedtime. May substitute to  any manufacturer covered by patient's insurance. To check blood sugars.   Lancet Device MISC 1 each by Does not apply route in the morning, at noon, and at bedtime. May substitute to any manufacturer covered by patient's insurance. To check blood sugars .   Lancets Misc. MISC 1 each by Does not apply route in the morning, at noon, and at bedtime. May substitute to any manufacturer covered by patient's insurance.   metFORMIN (GLUCOPHAGE-XR) 500 MG 24 hr tablet Take 1 tablet (500 mg total) by mouth daily with breakfast.   ferrous sulfate 325 (65 FE) MG tablet Take 1 tablet (325 mg total) by mouth daily with breakfast. (Patient not taking: Reported on 05/11/2023)   levocetirizine (XYZAL) 5 MG tablet Take 1 tablet (5 mg total) by mouth every evening.   No facility-administered encounter medications on file as of 05/18/2023.    Review of Systems  Review of Systems  Constitutional: Negative.   HENT: Negative.    Cardiovascular: Negative.   Gastrointestinal: Negative.   Allergic/Immunologic: Negative.   Neurological: Negative.   Psychiatric/Behavioral: Negative.       Objective:   BP 103/68 (BP Location: Right Arm, Patient Position: Sitting)   Pulse 84   Ht 4\' 9"  (1.448 m)   Wt 100 lb (45.4 kg)   LMP 11/08/2018 (Exact Date)   SpO2 98%   BMI 21.64 kg/m   Wt Readings from Last 5 Encounters:  05/18/23 100 lb (45.4 kg)  04/26/22 104 lb 3 oz (47.3 kg)  10/27/21 106  lb 0.6 oz (48.1 kg)  07/25/21 103 lb (46.7 kg)  04/18/21 102 lb 0.6 oz (46.3 kg)     Physical Exam Vitals and nursing note reviewed.  Constitutional:      General: She is not in acute distress.    Appearance: She is well-developed.  Cardiovascular:     Rate and Rhythm: Normal rate and regular rhythm.  Pulmonary:     Effort: Pulmonary effort is normal.     Breath sounds: Normal breath sounds.  Neurological:     Mental Status: She is alert and oriented to person, place, and time.       Assessment & Plan:    Routine adult health maintenance -     CBC -     Comprehensive metabolic panel  Lipid screening -     Lipid panel  Thyroid disorder screen -     TSH  Uncontrolled type 2 diabetes mellitus with hyperglycemia (HCC) -     AMB Referral to Pharmacy Medication Management     Return in about 3 months (around 08/17/2023).   Ivonne Andrew, NP 05/18/2023

## 2023-05-19 LAB — COMPREHENSIVE METABOLIC PANEL
ALT: 22 IU/L (ref 0–32)
AST: 21 IU/L (ref 0–40)
Albumin: 4.4 g/dL (ref 3.9–4.9)
Alkaline Phosphatase: 75 IU/L (ref 44–121)
BUN/Creatinine Ratio: 14 (ref 9–23)
BUN: 11 mg/dL (ref 6–24)
Bilirubin Total: 0.3 mg/dL (ref 0.0–1.2)
CO2: 22 mmol/L (ref 20–29)
Calcium: 9.4 mg/dL (ref 8.7–10.2)
Chloride: 102 mmol/L (ref 96–106)
Creatinine, Ser: 0.77 mg/dL (ref 0.57–1.00)
Globulin, Total: 2.8 g/dL (ref 1.5–4.5)
Glucose: 120 mg/dL — ABNORMAL HIGH (ref 70–99)
Potassium: 4.6 mmol/L (ref 3.5–5.2)
Sodium: 139 mmol/L (ref 134–144)
Total Protein: 7.2 g/dL (ref 6.0–8.5)
eGFR: 98 mL/min/{1.73_m2} (ref >59)

## 2023-05-19 LAB — CBC
Hematocrit: 39.7 % (ref 34.0–46.6)
Hemoglobin: 12.7 g/dL (ref 11.1–15.9)
MCH: 29.7 pg (ref 26.6–33.0)
MCHC: 32 g/dL (ref 31.5–35.7)
MCV: 93 fL (ref 79–97)
Platelets: 331 10*3/uL (ref 150–450)
RBC: 4.28 x10E6/uL (ref 3.77–5.28)
RDW: 12.9 % (ref 11.7–15.4)
WBC: 5.6 10*3/uL (ref 3.4–10.8)

## 2023-05-19 LAB — LIPID PANEL
Chol/HDL Ratio: 6.8 ratio — ABNORMAL HIGH (ref 0.0–4.4)
Cholesterol, Total: 293 mg/dL — ABNORMAL HIGH (ref 100–199)
HDL: 43 mg/dL (ref >39)
LDL Chol Calc (NIH): 213 mg/dL — ABNORMAL HIGH (ref 0–99)
Triglycerides: 191 mg/dL — ABNORMAL HIGH (ref 0–149)
VLDL Cholesterol Cal: 37 mg/dL (ref 5–40)

## 2023-05-19 LAB — TSH: TSH: 1.14 u[IU]/mL (ref 0.450–4.500)

## 2023-05-21 ENCOUNTER — Telehealth: Payer: Self-pay

## 2023-05-21 NOTE — Progress Notes (Signed)
Care Guide Note  05/21/2023 Name: Mardel Marsico MRN: [id number] DOB: 12/13/1980  Referred by: Ivonne Andrew, NP Reason for referral : Care Coordination (Outreach to schedule with Pharm d )   Tiffany [name] is a 42 y.o. year old female who is a primary care patient of Ivonne Andrew, NP. Tiffany [name] was referred to the pharmacist for assistance related to DM.    Successful contact was made with the patient to discuss pharmacy services including being ready for the pharmacist to call at least 5 minutes before the scheduled appointment time, to have medication bottles and any blood sugar or blood pressure readings ready for review. The patient agreed to meet with the pharmacist via with the pharmacist via telephone visit on (date/time).  05/30/2023  [name], RMA Care Guide [facility]  [place], [place] [address] Direct Dial: [phone] [email or web address]

## 2023-05-21 NOTE — Progress Notes (Signed)
Care Guide Note  05/21/2023 Name: Tiffany [name] MRN: [id number] DOB: Mar 30, 1981  Referred by: Ivonne Andrew, NP Reason for referral : Care Coordination (Outreach to schedule with Pharm d )   Tiffany [name] is a 42 y.o. year old female who is a primary care patient of Ivonne Andrew, NP. Tiffany [name] was referred to the pharmacist for assistance related to DM.    An unsuccessful telephone outreach was attempted today to contact the patient who was referred to the pharmacy team for assistance with medication management. Additional attempts will be made to contact the patient.   [name], RMA Care Guide [facility]  [place], [place] [address] Direct Dial: [phone] [email or web address]

## 2023-05-30 ENCOUNTER — Other Ambulatory Visit: Payer: Medicaid Other | Admitting: Pharmacist

## 2023-05-30 DIAGNOSIS — E1165 Type 2 diabetes mellitus with hyperglycemia: Secondary | ICD-10-CM

## 2023-05-30 MED ORDER — SITAGLIPTIN PHOSPHATE 100 MG PO TABS
100.0000 mg | ORAL_TABLET | Freq: Every day | ORAL | 1 refills | Status: DC
Start: 2023-05-30 — End: 2023-07-10

## 2023-05-30 NOTE — Progress Notes (Signed)
05/30/2023 Name: Tiffany [name] MRN: [id number] DOB: [birth year]  No chief complaint on file.   Tiffany [name] is a 42 y.o. year old female who presented for a telephone visit.   They were referred to the pharmacist by their PCP for assistance in managing diabetes.   Visit conducted using interpreter Rocky Crafts, [phone]  Subjective:  Patient reports feeling well today. Appetite has improved now that she stopped taking metformin. Notes she is taking an OTC medicine for sleep, but does not know the name and cannot read bottle.  Care Team: Primary Care Provider: Ivonne Andrew, NP ; Next Scheduled Visit: 06/14/23  Medication Access/Adherence  Current Pharmacy:  [id number] - [place], [place] - [address] AT [facility] [address] [facility] [address] [place] [address] Phone: [phone] Fax: [phone]   Patient reports affordability concerns with their medications: No  Patient reports access/transportation concerns to their pharmacy: No  Patient reports adherence concerns with their medications:  No    Diabetes:  Current medications: none Medications tried in the past: metformin (lack of appetite)  Has had improvement in appetite since stopping metformin. Has not picked up testing supplies from pharmacy, thinks they may have an old phone number on file because she did not receive notification that anything was ready for her. She plans to go to pharmacy tonight to get supplies.  Current glucose readings: not checking  Patient denies hypoglycemic s/sx including dizziness, shakiness, sweating. Patient denies hyperglycemic symptoms including polyuria, polydipsia, polyphagia, nocturia, neuropathy, blurred vision.  Current meal patterns: 2 meals/day - Breakfast: cookies with coffee - Lunch: rice, curry, eggs, chicken - Supper: rice, curry, chicken, beef - Drinks: water   Current physical activity: does housework, but no dedicated physical activity  Current medication  access support: has Medicaid  Hyperlipidemia/ASCVD Risk Reduction  Current lipid lowering medications: atorvastatin 10 mg daily  ASCVD History: none Family History: father- heart disease, mother- cancer Risk Factors: T2DM  The 10-year ASCVD risk score (Arnett DK, et al., 2019) is: 2.7%   Values used to calculate the score:     Age: 25 years     Sex: Female     Is Non-Hispanic African American: No     Diabetic: Yes     Tobacco smoker: No     Systolic Blood Pressure: 103 mmHg     Is BP treated: No     HDL Cholesterol: 43 mg/dL     Total Cholesterol: 293 mg/dL   Objective:  Lab Results  Component Value Date   HGBA1C 7.3 (A) 05/11/2023    Lab Results  Component Value Date   CREATININE 0.77 05/18/2023   BUN 11 05/18/2023   NA 139 05/18/2023   K 4.6 05/18/2023   CL 102 05/18/2023   CO2 22 05/18/2023    Lab Results  Component Value Date   CHOL 293 (H) 05/18/2023   HDL 43 05/18/2023   LDLCALC 213 (H) 05/18/2023   TRIG 191 (H) 05/18/2023   CHOLHDL 6.8 (H) 05/18/2023    Medications Reviewed Today   Medications were not reviewed in this encounter       Assessment/Plan:   Diabetes: - Currently uncontrolled based on last A1c 7.3% above goal <7% - Reviewed long term cardiovascular and renal outcomes of uncontrolled blood sugar - Reviewed goal A1c, goal fasting, and goal 2 hour post prandial glucose - Recommend to start Januvia 100 mg daily. Given BMI of 21, hx of low appetite, and  A1c close to goal with no significant ASCVD hx would favor addition of DPP-4 for tolerability. Will collaborate with PCP to send in RX. - Will discontinue metformin from med list given she has stopped taking it and appetite has come back to baseline - Recommend to check fasting blood glucose once daily in the morning - patient to pick up testing supplies from pharmacy today.  Hyperlipidemia/ASCVD Risk Reduction: - Currently uncontrolled based on last LDL 213, above goal <70 mg/dl -  Recommend to continue atorvastatin 10 mg daily and check updated lipids at next PCP visit on 10/10. If LDL still above goal <70 would recommend increasing to atorvastatin 80 mg daily. Given significantly elevated LDL 213, will likely need multiple medications to reach goal.   Follow Up Plan: PCP visit on 10/10, telephone visit 11/4  Jarrett Ables, PharmD PGY-1 Pharmacy Resident

## 2023-06-14 ENCOUNTER — Encounter: Payer: Self-pay | Admitting: Nurse Practitioner

## 2023-06-22 ENCOUNTER — Ambulatory Visit
Admission: RE | Admit: 2023-06-22 | Discharge: 2023-06-22 | Disposition: A | Discharge status: Home / Self Care | Payer: Medicaid Other | Source: Ambulatory Visit | Attending: Nurse Practitioner

## 2023-06-22 DIAGNOSIS — Z1231 Encounter for screening mammogram for malignant neoplasm of breast: Secondary | ICD-10-CM

## 2023-07-09 ENCOUNTER — Other Ambulatory Visit: Payer: Medicaid Other | Admitting: Pharmacist

## 2023-07-10 ENCOUNTER — Other Ambulatory Visit (INDEPENDENT_AMBULATORY_CARE_PROVIDER_SITE_OTHER): Payer: Medicaid Other | Admitting: Pharmacist

## 2023-07-10 DIAGNOSIS — E1165 Type 2 diabetes mellitus with hyperglycemia: Secondary | ICD-10-CM

## 2023-07-10 MED ORDER — ATORVASTATIN CALCIUM 80 MG PO TABS: 80.0000 mg | ORAL_TABLET | Freq: Every day | ORAL | 3 refills | Status: DC

## 2023-07-10 MED ORDER — SITAGLIPTIN PHOSPHATE 100 MG PO TABS: 100.0000 mg | ORAL_TABLET | Freq: Every day | ORAL | 3 refills | Status: DC

## 2023-07-10 NOTE — Patient Instructions (Addendum)
For constipation, try Metamucil or Miralax.   We are increasing atorvastatin to 80 mg daily. Continue Januvia 100 mg daily.   Take care!  Catie Clearance Coots

## 2023-07-10 NOTE — Progress Notes (Signed)
   07/10/2023 Name: Tiffany [name] MRN: [id number] DOB: 01-31-1981  Chief Complaint  Patient presents with   Medication Management   Diabetes    Tiffany [name] is a 42 y.o. year old female who presented for an in person visit.    They were referred to the pharmacist by their PCP for assistance in managing diabetes.    Subjective:  Care Team: Primary Care Provider: Ivonne Andrew, NP ; Next Scheduled Visit: 08/21/2023  Medication Access/Adherence  Current Pharmacy:  [facility] #[id number] - [place], [place] - [address] AT [place] [facility] [address] & [facility] [address] [place] [address] Phone: [phone] Fax: [phone]   Patient reports affordability concerns with their medications: No  Patient reports access/transportation concerns to their pharmacy: No  Patient reports adherence concerns with their medications:  No     Diabetes:  Current medications: Januvia 100 mg daily  Prior medications: decreased appetite with metformin, avoidance of SGLT2 to avoid further weigh tloss  Reports she is feeling much better, no GI upset/decrease in appetite with Januvia.   Current glucose readings: has not been checking. Denies symptoms of hyperglycemia including polyuria, polydipsia  Hyperlipidemia/ASCVD Risk Reduction  Current lipid lowering medications: atorvastatin 10 mg daily  Objective:  Lab Results  Component Value Date   HGBA1C 7.3 (A) 05/11/2023    Lab Results  Component Value Date   CREATININE 0.77 05/18/2023   BUN 11 05/18/2023   NA 139 05/18/2023   K 4.6 05/18/2023   CL 102 05/18/2023   CO2 22 05/18/2023    Lab Results  Component Value Date   CHOL 293 (H) 05/18/2023   HDL 43 05/18/2023   LDLCALC 213 (H) 05/18/2023   TRIG 191 (H) 05/18/2023   CHOLHDL 6.8 (H) 05/18/2023    Medications Reviewed Today     Reviewed by Alden Hipp, RPH-CPP (Pharmacist) on 07/10/23 at 1602  Med List Status: <None>   Medication Order Taking? Sig Documenting  Provider Last Dose Status Informant  Discontinued 07/10/23 1547   atorvastatin (LIPITOR) 80 MG tablet [id number] Yes Take 1 tablet (80 mg total) by mouth daily. Ivonne Andrew, NP  Active   Blood Glucose Monitoring Suppl DEVI [id number]  1 each by Does not apply route in the morning, at noon, and at bedtime. May substitute to any manufacturer covered by patient's insurance. To check blood sugars Paseda, Baird Kay, FNP  Active   sitaGLIPtin (JANUVIA) 100 MG tablet [id number]  Take 1 tablet (100 mg total) by mouth daily. Ivonne Andrew, NP  Active               Assessment/Plan:   Diabetes: - Currently uncontrolled per last A1c but anticipate improvement - Recommend to continue Januvia 100 mg daily. Will collaborate with PCP for 90 day supply. A1c in ~ 6 weeks  Hyperlipidemia/ASCVD Risk Reduction: - Currently uncontrolled.  - Recommend to increase atorvastatin to 80 mg daily. Will collaborate with PCP to place orders. Counseled patient to contact us if development of muscle aches/pains. Recheck lipids with next PCP appointment  Follow Up Plan: PCP in 6 weeks  Catie Eppie Gibson, PharmD, BCACP, CPP Clinical Pharmacist [facility] [phone]

## 2023-08-20 ENCOUNTER — Ambulatory Visit: Payer: Self-pay | Admitting: Nurse Practitioner

## 2023-08-21 ENCOUNTER — Ambulatory Visit: Payer: Self-pay | Admitting: Nurse Practitioner

## 2023-09-06 ENCOUNTER — Ambulatory Visit: Payer: Medicaid Other | Admitting: Nurse Practitioner

## 2023-09-06 ENCOUNTER — Encounter: Payer: Self-pay | Admitting: Nurse Practitioner

## 2023-09-06 VITALS — BP 118/83 | HR 90 | Temp 97.2°F | Wt 97.4 lb

## 2023-09-06 DIAGNOSIS — N951 Menopausal and female climacteric states: Secondary | ICD-10-CM | POA: Diagnosis not present

## 2023-09-06 DIAGNOSIS — E1165 Type 2 diabetes mellitus with hyperglycemia: Secondary | ICD-10-CM | POA: Diagnosis not present

## 2023-09-06 DIAGNOSIS — R519 Headache, unspecified: Secondary | ICD-10-CM

## 2023-09-06 DIAGNOSIS — G8929 Other chronic pain: Secondary | ICD-10-CM

## 2023-09-06 LAB — POCT GLYCOSYLATED HEMOGLOBIN (HGB A1C): Hemoglobin A1C: 6.4 % — AB (ref 4.0–5.6)

## 2023-09-06 MED ORDER — TIZANIDINE HCL 4 MG PO TABS: 4.0000 mg | ORAL_TABLET | Freq: Four times a day (QID) | ORAL | 0 refills | Status: AC | PRN

## 2023-09-06 NOTE — Progress Notes (Signed)
 Subjective   Patient ID: Tiffany [name], female    DOB: 1981-07-05, 43 y.o.   MRN: [id number]  Chief Complaint  Patient presents with   Follow-up    Referring provider: Oley Bascom RAMAN, NP  Tiffany [name] is a 43 y.o. female with Past Medical History: No date: Appendicitis 01/2019: COVID-19 No date: Hyperlipidemia No date: Medical history non-contributory No date: Prediabetes   HPI  Patient presents today to follow-up on diabetes and physical.  She was seen by Lorice 2 weeks ago and was diagnosed with diabetes cholesterol.  She was started on metformin  XR daily and Lipitor.  Patient stated that metformin  had caused her to lose her appetite.  We had pharmacy to follow her for medication management.  She is currently on januvia . She is tolerating Lipitor well. A1c in office today 6.4. Patient complains today of chronic right side headaches that have been reoccurring for the past several months. We will trial muscle relaxer and refer patient to neurology. Patient also complains of not having a menstrual cycle anymore, but is having hotflashes. Will place referral to OB/GYN. Denies f/c/s, n/v/d, hemoptysis, PND, leg swelling. Denies chest pain or edema.    No Known Allergies  Immunization History  Administered Date(s) Administered   Influenza, Seasonal, Injecte, Preservative Fre 05/11/2023   Influenza,inj,Quad PF,6+ Mos 06/29/2016, 05/19/2019, 06/02/2020, 07/25/2021   Tdap 04/17/2019    Tobacco History: Social History   Tobacco Use  Smoking Status Never  Smokeless Tobacco Never   Counseling given: Not Answered   Outpatient Encounter Medications as of 09/06/2023  Medication Sig   atorvastatin  (LIPITOR) 80 MG tablet Take 1 tablet (80 mg total) by mouth daily.   sitaGLIPtin  (JANUVIA ) 100 MG tablet Take 1 tablet (100 mg total) by mouth daily.   tiZANidine  (ZANAFLEX ) 4 MG tablet Take 1 tablet (4 mg total) by mouth every 6 (six) hours as needed for muscle spasms.   Blood Glucose Monitoring  Suppl DEVI 1 each by Does not apply route in the morning, at noon, and at bedtime. May substitute to any manufacturer covered by patient's insurance. To check blood sugars (Patient not taking: Reported on 09/06/2023)   No facility-administered encounter medications on file as of 09/06/2023.    Review of Systems  Review of Systems  Constitutional: Negative.   HENT: Negative.    Cardiovascular: Negative.   Gastrointestinal: Negative.   Genitourinary:  Positive for menstrual problem.  Allergic/Immunologic: Negative.   Neurological:  Positive for headaches.  Psychiatric/Behavioral: Negative.       Objective:   BP 118/83   Pulse 90   Temp (!) 97.2 F (36.2 C)   Wt 97 lb 6.4 oz (44.2 kg)   LMP 11/08/2018 (Exact Date)   SpO2 100%   BMI 21.08 kg/m   Wt Readings from Last 5 Encounters:  09/06/23 97 lb 6.4 oz (44.2 kg)  05/18/23 100 lb (45.4 kg)  04/26/22 104 lb 3 oz (47.3 kg)  10/27/21 106 lb 0.6 oz (48.1 kg)  07/25/21 103 lb (46.7 kg)     Physical Exam Vitals and nursing note reviewed.  Constitutional:      General: She is not in acute distress.    Appearance: She is well-developed.  Cardiovascular:     Rate and Rhythm: Normal rate and regular rhythm.  Pulmonary:     Effort: Pulmonary effort is normal.     Breath sounds: Normal breath sounds.  Neurological:     Mental Status: She is alert and oriented to person, place,  and time.       Assessment & Plan:   Uncontrolled type 2 diabetes mellitus with hyperglycemia (HCC) -     CBC -     Comprehensive metabolic panel -     POCT glycosylated hemoglobin (Hb A1C)  Chronic nonintractable headache, unspecified headache type -     tiZANidine  HCl; Take 1 tablet (4 mg total) by mouth every 6 (six) hours as needed for muscle spasms.  Dispense: 30 tablet; Refill: 0 -     Ambulatory referral to Neurology  Hot flashes due to menopause -     Ambulatory referral to Obstetrics / Gynecology     Return in about 3 months (around  12/05/2023).   Bascom GORMAN Borer, NP 09/06/2023

## 2023-09-06 NOTE — Patient Instructions (Addendum)
 1. Uncontrolled type 2 diabetes mellitus with hyperglycemia (HCC) (Primary)  - CBC - Comprehensive metabolic panel - POCT glycosylated hemoglobin (Hb A1C)  2. Chronic nonintractable headache, unspecified headache type  - tiZANidine  (ZANAFLEX ) 4 MG tablet; Take 1 tablet (4 mg total) by mouth every 6 (six) hours as needed for muscle spasms.  Dispense: 30 tablet; Refill: 0 - Ambulatory referral to Neurology  3. Hot flashes due to menopause  - Ambulatory referral to Obstetrics / Gynecology    Follow up:  Follow up in 3 months

## 2023-09-07 LAB — COMPREHENSIVE METABOLIC PANEL
ALT: 16 [IU]/L (ref 0–32)
AST: 18 [IU]/L (ref 0–40)
Albumin: 4.7 g/dL (ref 3.9–4.9)
Alkaline Phosphatase: 80 [IU]/L (ref 44–121)
BUN/Creatinine Ratio: 15 (ref 9–23)
BUN: 10 mg/dL (ref 6–24)
Bilirubin Total: 0.4 mg/dL (ref 0.0–1.2)
CO2: 23 mmol/L (ref 20–29)
Calcium: 9.6 mg/dL (ref 8.7–10.2)
Chloride: 101 mmol/L (ref 96–106)
Creatinine, Ser: 0.68 mg/dL (ref 0.57–1.00)
Globulin, Total: 2.6 g/dL (ref 1.5–4.5)
Glucose: 88 mg/dL (ref 70–99)
Potassium: 4.1 mmol/L (ref 3.5–5.2)
Sodium: 140 mmol/L (ref 134–144)
Total Protein: 7.3 g/dL (ref 6.0–8.5)
eGFR: 110 mL/min/{1.73_m2} (ref >59)

## 2023-09-07 LAB — CBC
Hematocrit: 39.1 % (ref 34.0–46.6)
Hemoglobin: 12.9 g/dL (ref 11.1–15.9)
MCH: 29.7 pg (ref 26.6–33.0)
MCHC: 33 g/dL (ref 31.5–35.7)
MCV: 90 fL (ref 79–97)
Platelets: 349 10*3/uL (ref 150–450)
RBC: 4.34 x10E6/uL (ref 3.77–5.28)
RDW: 12.7 % (ref 11.7–15.4)
WBC: 7.3 10*3/uL (ref 3.4–10.8)

## 2023-09-19 LAB — HM DIABETES EYE EXAM

## 2023-12-20 ENCOUNTER — Ambulatory Visit: Payer: Self-pay

## 2023-12-20 NOTE — Telephone Encounter (Signed)
  Chief Complaint: cough, sore throat Symptoms: cough, sore throat Frequency: about 2 weeks Pertinent Negatives: Patient denies fever, hemoptysis, SOB when not coughing, productive cough,  Disposition: [] ED /[] Urgent Care (no appt availability in office) / [x] Appointment(In office/virtual)/ []  [facility]/ [] Home Care/ [] Refused Recommended Disposition /[] [facility]/ []  Follow-up with PCP Additional Notes: pt states that she has had a cough approx 2 weeks pt states is it not productive. Denies fever. Pt states that she has not traveled long trips nor out of the country. Daughter can be heard coughing in the background. Pt states that she only has a hard time catching her breath when she is coughing, denies SOB otherwise. Attempted to schedule, no appts until May, routed HP to clinic.  Copied from CRM [id number]. Topic: Clinical - Red Word Triage >> Dec 20, 2023  4:40 PM [place] B wrote: Pt has sob when she coughs and sore throat Answer Assessment - Initial Assessment Questions 1. ONSET: "When did the cough begin?"      About 2 weeks 2. SEVERITY: "How bad is the cough today?"      mild 3. SPUTUM: "Describe the color of your sputum" (none, dry cough; clear, white, yellow, green)     No sputum 4. HEMOPTYSIS: "Are you coughing up any blood?" If so ask: "How much?" (flecks, streaks, tablespoons, etc.)     denies 5. DIFFICULTY BREATHING: "Are you having difficulty breathing?" If Yes, ask: "How bad is it?" (e.g., mild, moderate, severe)    - MILD: No SOB at rest, mild SOB with walking, speaks normally in sentences, can lie down, no retractions, pulse < 100.    - MODERATE: SOB at rest, SOB with minimal exertion and prefers to sit, cannot lie down flat, speaks in phrases, mild retractions, audible wheezing, pulse 100-120.    - SEVERE: Very SOB at rest, speaks in single words, struggling to breathe, sitting hunched forward, retractions, pulse > 120      Only sob when coughs 6.  FEVER: "Do you have a fever?" If Yes, ask: "What is your temperature, how was it measured, and when did it start?"     denies 7. CARDIAC HISTORY: "Do you have any history of heart disease?" (e.g., heart attack, congestive heart failure)      denies 8. LUNG HISTORY: "Do you have any history of lung disease?"  (e.g., pulmonary embolus, asthma, emphysema)     denies 9. PE RISK FACTORS: "Do you have a history of blood clots?" (or: recent major surgery, recent prolonged travel, bedridden)     denies 10. OTHER SYMPTOMS: "Do you have any other symptoms?" (e.g., runny nose, wheezing, chest pain)       Loss of appetite, sore throat 11. PREGNANCY: "Is there any chance you are pregnant?" "When was your last menstrual period?"       denies 12. TRAVEL: "Have you traveled out of the country in the last month?" (e.g., travel history, exposures)       denies  Protocols used: Cough - Acute Non-Productive-A-AH

## 2023-12-24 NOTE — Telephone Encounter (Signed)
 Reason for Disposition . Cough has been present for > 3 weeks  Protocols used: Cough - Acute Non-Productive-A-AH

## 2024-07-12 ENCOUNTER — Ambulatory Visit (HOSPITAL_COMMUNITY): Admission: EM | Admit: 2024-07-12 | Discharge: 2024-07-12 | Disposition: A | Discharge status: Home / Self Care

## 2024-07-12 ENCOUNTER — Encounter (HOSPITAL_COMMUNITY): Payer: Self-pay | Admitting: *Deleted

## 2024-07-12 DIAGNOSIS — E1165 Type 2 diabetes mellitus with hyperglycemia: Secondary | ICD-10-CM

## 2024-07-12 DIAGNOSIS — R1030 Lower abdominal pain, unspecified: Secondary | ICD-10-CM | POA: Diagnosis not present

## 2024-07-12 DIAGNOSIS — Z760 Encounter for issue of repeat prescription: Secondary | ICD-10-CM | POA: Diagnosis not present

## 2024-07-12 DIAGNOSIS — M62830 Muscle spasm of back: Secondary | ICD-10-CM | POA: Diagnosis not present

## 2024-07-12 LAB — POCT URINE DIPSTICK
Bilirubin, UA: NEGATIVE
Blood, UA: NEGATIVE
Glucose, UA: 500 mg/dL — AB
Ketones, POC UA: NEGATIVE mg/dL
Leukocytes, UA: NEGATIVE
Nitrite, UA: NEGATIVE
Protein Ur, POC: NEGATIVE mg/dL
Spec Grav, UA: <=1.005 — AB (ref 1.010–1.025)
Urobilinogen, UA: 0.2 U/dL
pH, UA: 5.5 (ref 5.0–8.0)

## 2024-07-12 LAB — GLUCOSE, POCT (MANUAL RESULT ENTRY): POCT Glucose (KUC): 296 mg/dL — AB (ref 70–99)

## 2024-07-12 MED ORDER — BACLOFEN 10 MG PO TABS: 10.0000 mg | ORAL_TABLET | Freq: Three times a day (TID) | ORAL | 0 refills | Status: AC

## 2024-07-12 MED ORDER — ATORVASTATIN CALCIUM 80 MG PO TABS: 80.0000 mg | ORAL_TABLET | Freq: Every day | ORAL | 0 refills | Status: AC

## 2024-07-12 MED ORDER — DICLOFENAC SODIUM 50 MG PO TBEC: 50.0000 mg | DELAYED_RELEASE_TABLET | Freq: Two times a day (BID) | ORAL | 1 refills | Status: AC

## 2024-07-12 MED ORDER — SITAGLIPTIN PHOSPHATE 100 MG PO TABS: 100.0000 mg | ORAL_TABLET | Freq: Every day | ORAL | 0 refills | Status: AC

## 2024-07-12 NOTE — Discharge Instructions (Addendum)
  1. Lower abdominal pain (Primary) - POC Urinalysis Dipstick completed in UC shows no acute findings of urinary tract infection, no leukocytes, no nitrite, no blood, urine glucose greater than 500, which could be a sign of uncontrolled diabetes.  Monitor glucose closely and take medications as prescribed.  2. Muscle spasm of back - diclofenac (VOLTAREN) 50 MG EC tablet; Take 1 tablet (50 mg total) by mouth 2 (two) times daily.  Dispense: 30 tablet; Refill: 1 - baclofen (LIORESAL) 10 MG tablet; Take 1 tablet (10 mg total) by mouth 3 (three) times daily.  Dispense: 30 each; Refill: 0  3.  Medication refill - Represcribed atorvastatin  80 mg tablet once daily for treatment of hyperlipidemia - Represcribed sitagliptin  100 mg daily for treatment of type 2 diabetes. - Follow-up with primary care provider as scheduled in December for further evaluation and ongoing medication management.

## 2024-07-12 NOTE — ED Triage Notes (Signed)
 Professional interpreter service used for clinical intake.   Pt states she has been having back pain X 2 weeks, denies any injury. She has been taking OTC pain meds.

## 2024-07-12 NOTE — ED Provider Notes (Signed)
 [facility]  Note:  This document was prepared using Conservation officer, historic buildings and may include unintentional dictation errors.  MRN: [id number] DOB: [birth year]  Subjective:   Tiffany [name] is a 43 y.o. female presenting for sharp lower back pain intermittently x 2 weeks.  Patient reports the pain is worse on the left side than the right.  Denies any known injury or trauma.  Patient has been taking over-the-counter generic pain reliever as well as Tylenol  for pain with minimal improvement.  Patient denies any past history of lower back pain or sciatica.  Denies any radiculopathy to lower extremities.  No current facility-administered medications for this encounter.  Current Outpatient Medications:    baclofen (LIORESAL) 10 MG tablet, Take 1 tablet (10 mg total) by mouth 3 (three) times daily., Disp: 30 each, Rfl: 0   diclofenac (VOLTAREN) 50 MG EC tablet, Take 1 tablet (50 mg total) by mouth 2 (two) times daily., Disp: 30 tablet, Rfl: 1   atorvastatin  (LIPITOR) 80 MG tablet, Take 1 tablet (80 mg total) by mouth daily., Disp: 90 tablet, Rfl: 0   Blood Glucose Monitoring Suppl DEVI, 1 each by Does not apply route in the morning, at noon, and at bedtime. May substitute to any manufacturer covered by patient's insurance. To check blood sugars (Patient not taking: Reported on 09/06/2023), Disp: 1 each, Rfl: 0   sitaGLIPtin  (JANUVIA ) 100 MG tablet, Take 1 tablet (100 mg total) by mouth daily., Disp: 90 tablet, Rfl: 0   tiZANidine  (ZANAFLEX ) 4 MG tablet, Take 1 tablet (4 mg total) by mouth every 6 (six) hours as needed for muscle spasms., Disp: 30 tablet, Rfl: 0   No Known Allergies  Past Medical History:  Diagnosis Date   Appendicitis    COVID-19 01/2019   Hyperlipidemia    Medical history non-contributory    Prediabetes      Past Surgical History:  Procedure Laterality Date   ABDOMINAL HYSTERECTOMY N/A 07/15/2019   Procedure: HYSTERECTOMY ABDOMINAL;  Surgeon: Barbra Lang PARAS, DO;  Location: [facility];  Service: Obstetrics;  Laterality: N/A;   CESAREAN SECTION N/A 07/15/2019   Procedure: CESAREAN SECTION;  Surgeon: Barbra Lang PARAS, DO;  Location: [facility];  Service: Obstetrics;  Laterality: N/A;   LAPAROSCOPIC APPENDECTOMY N/A 04/20/2014   Procedure: APPENDECTOMY LAPAROSCOPIC;  Surgeon: Bernarda Ned, MD;  Location: [facility];  Service: General;  Laterality: N/A;   supra cervical hysterectomy N/A     Family History  Problem Relation Age of Onset   Breast cancer Mother    Heart disease Father     Social History   Tobacco Use   Smoking status: Never   Smokeless tobacco: Never  Vaping Use   Vaping status: Never Used  Substance Use Topics   Alcohol use: No   Drug use: No    ROS Refer to HPI for ROS details.  Objective:    Vitals: BP 114/83   Pulse 98   Temp 98.3 F (36.8 C) (Oral)   Resp 16   LMP 11/08/2018 (Exact Date)   SpO2 99%   Physical Exam Vitals and nursing note reviewed.  Constitutional:      General: She is not in acute distress.    Appearance: Normal appearance. She is well-developed. She is not ill-appearing or toxic-appearing.  HENT:     Head: Normocephalic and atraumatic.  Cardiovascular:     Rate and Rhythm: Normal rate.  Pulmonary:     Effort: Pulmonary effort is normal. No respiratory  distress.  Musculoskeletal:     Thoracic back: Spasms and tenderness present. No swelling or bony tenderness. Decreased range of motion.     Lumbar back: Spasms and tenderness present. No swelling or bony tenderness. Decreased range of motion. Negative right straight leg raise test and negative left straight leg raise test.  Skin:    General: Skin is warm and dry.  Neurological:     General: No focal deficit present.     Mental Status: She is alert and oriented to person, place, and time.  Psychiatric:        Mood and Affect: Mood normal.        Behavior: Behavior normal.     Procedures  Results for orders placed or performed  during the hospital encounter of 07/12/24 (from the past 24 hours)  POC Urinalysis Dipstick     Status: Abnormal   Collection Time: 07/12/24  4:16 PM  Result Value Ref Range   Color, UA yellow yellow   Clarity, UA clear clear   Glucose, UA =500 (A) negative mg/dL   Bilirubin, UA negative negative   Ketones, POC UA negative negative mg/dL   Spec Grav, UA <=8.994 (A) 1.010 - 1.025   Blood, UA negative negative   pH, UA 5.5 5.0 - 8.0   Protein Ur, POC negative negative mg/dL   Urobilinogen, UA 0.2 0.2 or 1.0 E.U./dL   Nitrite, UA Negative Negative   Leukocytes, UA Negative Negative  POCT glucose (manual entry)     Status: Abnormal   Collection Time: 07/12/24  4:41 PM  Result Value Ref Range   POCT Glucose ([facility]) 296 (A) 70 - 99 mg/dL    Assessment and Plan :     Discharge Instructions       1. Lower abdominal pain (Primary) - POC Urinalysis Dipstick completed in UC shows no acute findings of urinary tract infection, no leukocytes, no nitrite, no blood, urine glucose greater than 500, which could be a sign of uncontrolled diabetes.  Monitor glucose closely and take medications as prescribed.  2. Muscle spasm of back - diclofenac (VOLTAREN) 50 MG EC tablet; Take 1 tablet (50 mg total) by mouth 2 (two) times daily.  Dispense: 30 tablet; Refill: 1 - baclofen (LIORESAL) 10 MG tablet; Take 1 tablet (10 mg total) by mouth 3 (three) times daily.  Dispense: 30 each; Refill: 0  3.  Medication refill - Represcribed atorvastatin  80 mg tablet once daily for treatment of hyperlipidemia - Represcribed sitagliptin  100 mg daily for treatment of type 2 diabetes. - Follow-up with primary care provider as scheduled in December for further evaluation and ongoing medication management.      Aquinnah Devin B Yousef Huge   Celester Morgan, [place], [place] 07/12/24 [phone]

## 2024-07-12 NOTE — ED Notes (Addendum)
 Pt states she is out of all meds and has an appt with PCP on 08/11/2024. Provider advised

## 2024-08-11 ENCOUNTER — Ambulatory Visit: Payer: Self-pay | Admitting: Nurse Practitioner

## 2024-08-11 ENCOUNTER — Encounter: Payer: Self-pay | Admitting: Nurse Practitioner

## 2024-08-11 VITALS — BP 116/77 | HR 86 | Wt 101.6 lb

## 2024-08-11 DIAGNOSIS — E785 Hyperlipidemia, unspecified: Secondary | ICD-10-CM | POA: Diagnosis not present

## 2024-08-11 DIAGNOSIS — E1165 Type 2 diabetes mellitus with hyperglycemia: Secondary | ICD-10-CM

## 2024-08-11 LAB — POCT GLYCOSYLATED HEMOGLOBIN (HGB A1C): Hemoglobin A1C: 7.7 % — AB (ref 4.0–5.6)

## 2024-08-11 NOTE — Progress Notes (Signed)
 Subjective   Patient ID: Tiffany [name], female    DOB: Jan 22, 1981, 43 y.o.   MRN: [id number]  Chief Complaint  Patient presents with   Hyperlipidemia   Diabetes    Referring provider: Oley Bascom RAMAN, NP  Tiffany [name] is a 43 y.o. female with Past Medical History: No date: Appendicitis 01/2019: COVID-19 No date: Hyperlipidemia No date: Medical history non-contributory No date: Prediabetes   HPI  Patient presents today to follow-up on diabetes and physical.  Recently diagnosed with diabetes and elevated cholesterol.  She was started on metformin  XR daily and Lipitor.  Could not tolerate metformin . She is currently on januvia . She is tolerating Lipitor well. A1c in office today 7.7. Denies f/c/s, n/v/d, hemoptysis, PND, leg swelling. Denies chest pain or edema.     No Known Allergies  Immunization History  Administered Date(s) Administered   Influenza, Seasonal, Injecte, Preservative Fre 05/11/2023   Influenza,inj,Quad PF,6+ Mos 06/29/2016, 05/19/2019, 06/02/2020, 07/25/2021   Tdap 04/17/2019    Tobacco History: Social History   Tobacco Use  Smoking Status Never  Smokeless Tobacco Never   Counseling given: Not Answered   Outpatient Encounter Medications as of 08/11/2024  Medication Sig   atorvastatin  (LIPITOR) 80 MG tablet Take 1 tablet (80 mg total) by mouth daily.   baclofen  (LIORESAL ) 10 MG tablet Take 1 tablet (10 mg total) by mouth 3 (three) times daily.   Blood Glucose Monitoring Suppl DEVI 1 each by Does not apply route in the morning, at noon, and at bedtime. May substitute to any manufacturer covered by patient's insurance. To check blood sugars (Patient not taking: Reported on 09/06/2023)   diclofenac  (VOLTAREN ) 50 MG EC tablet Take 1 tablet (50 mg total) by mouth 2 (two) times daily.   sitaGLIPtin  (JANUVIA ) 100 MG tablet Take 1 tablet (100 mg total) by mouth daily.   tiZANidine  (ZANAFLEX ) 4 MG tablet Take 1 tablet (4 mg total) by mouth every 6 (six) hours as needed for  muscle spasms.   No facility-administered encounter medications on file as of 08/11/2024.    Review of Systems  Review of Systems  Constitutional: Negative.   HENT: Negative.    Cardiovascular: Negative.   Gastrointestinal: Negative.   Allergic/Immunologic: Negative.   Neurological: Negative.   Psychiatric/Behavioral: Negative.       Objective:   BP 116/77 (BP Location: Left Arm, Patient Position: Sitting, Cuff Size: Normal)   Pulse 86   Wt 101 lb 9.6 oz (46.1 kg)   LMP 11/08/2018 (Exact Date)   SpO2 100%   BMI 21.99 kg/m   Wt Readings from Last 5 Encounters:  08/11/24 101 lb 9.6 oz (46.1 kg)  09/06/23 97 lb 6.4 oz (44.2 kg)  05/18/23 100 lb (45.4 kg)  04/26/22 104 lb 3 oz (47.3 kg)  10/27/21 106 lb 0.6 oz (48.1 kg)     Physical Exam Vitals and nursing note reviewed.  Constitutional:      General: She is not in acute distress.    Appearance: She is well-developed.  Cardiovascular:     Rate and Rhythm: Normal rate and regular rhythm.  Pulmonary:     Effort: Pulmonary effort is normal.     Breath sounds: Normal breath sounds.  Neurological:     Mental Status: She is alert and oriented to person, place, and time.       Assessment & Plan:   Uncontrolled type 2 diabetes mellitus with hyperglycemia (HCC) -     POCT glycosylated hemoglobin (Hb A1C) -  CBC -     Comprehensive metabolic panel with GFR -     Lipid panel  Dyslipidemia, goal LDL below 70 -     CBC -     Comprehensive metabolic panel with GFR -     Lipid panel     Return in about 3 months (around 11/09/2024).   Bascom GORMAN Borer, NP 08/11/2024

## 2024-08-12 LAB — CBC
Hematocrit: 35.5 % (ref 34.0–46.6)
Hemoglobin: 11.5 g/dL (ref 11.1–15.9)
MCH: 29 pg (ref 26.6–33.0)
MCHC: 32.4 g/dL (ref 31.5–35.7)
MCV: 90 fL (ref 79–97)
Platelets: 331 x10E3/uL (ref 150–450)
RBC: 3.96 x10E6/uL (ref 3.77–5.28)
RDW: 12.9 % (ref 11.7–15.4)
WBC: 10.8 x10E3/uL (ref 3.4–10.8)

## 2024-08-12 LAB — LIPID PANEL
Chol/HDL Ratio: 3.5 ratio (ref 0.0–4.4)
Cholesterol, Total: 153 mg/dL (ref 100–199)
HDL: 44 mg/dL (ref >39)
LDL Chol Calc (NIH): 81 mg/dL (ref 0–99)
Triglycerides: 165 mg/dL — ABNORMAL HIGH (ref 0–149)
VLDL Cholesterol Cal: 28 mg/dL (ref 5–40)

## 2024-08-12 LAB — COMPREHENSIVE METABOLIC PANEL WITH GFR
ALT: 17 IU/L (ref 0–32)
AST: 15 IU/L (ref 0–40)
Albumin: 4.1 g/dL (ref 3.9–4.9)
Alkaline Phosphatase: 93 IU/L (ref 41–116)
BUN/Creatinine Ratio: 14 (ref 9–23)
BUN: 9 mg/dL (ref 6–24)
Bilirubin Total: 0.4 mg/dL (ref 0.0–1.2)
CO2: 21 mmol/L (ref 20–29)
Calcium: 9.1 mg/dL (ref 8.7–10.2)
Chloride: 103 mmol/L (ref 96–106)
Creatinine, Ser: 0.64 mg/dL (ref 0.57–1.00)
Globulin, Total: 2.6 g/dL (ref 1.5–4.5)
Glucose: 108 mg/dL — ABNORMAL HIGH (ref 70–99)
Potassium: 4 mmol/L (ref 3.5–5.2)
Sodium: 137 mmol/L (ref 134–144)
Total Protein: 6.7 g/dL (ref 6.0–8.5)
eGFR: 112 mL/min/1.73 (ref >59)

## 2024-08-13 ENCOUNTER — Ambulatory Visit: Payer: Self-pay | Admitting: Nurse Practitioner

## 2024-08-13 DIAGNOSIS — Z1231 Encounter for screening mammogram for malignant neoplasm of breast: Secondary | ICD-10-CM | POA: Diagnosis not present

## 2024-08-13 DIAGNOSIS — R92333 Mammographic heterogeneous density, bilateral breasts: Secondary | ICD-10-CM | POA: Diagnosis not present

## 2024-08-20 ENCOUNTER — Encounter (HOSPITAL_COMMUNITY): Payer: Self-pay | Admitting: Emergency Medicine

## 2024-08-20 ENCOUNTER — Ambulatory Visit (HOSPITAL_COMMUNITY)
Admission: EM | Admit: 2024-08-20 | Discharge: 2024-08-20 | Disposition: A | Discharge status: Home / Self Care | Attending: Internal Medicine | Admitting: Internal Medicine

## 2024-08-20 DIAGNOSIS — J0110 Acute frontal sinusitis, unspecified: Secondary | ICD-10-CM | POA: Diagnosis not present

## 2024-08-20 DIAGNOSIS — R519 Headache, unspecified: Secondary | ICD-10-CM

## 2024-08-20 LAB — POC COVID19/FLU A&B COMBO
Covid Antigen, POC: NEGATIVE
Influenza A Antigen, POC: NEGATIVE
Influenza B Antigen, POC: NEGATIVE

## 2024-08-20 MED ORDER — DEXAMETHASONE SOD PHOSPHATE PF 10 MG/ML IJ SOLN
10.0000 mg | Freq: Once | INTRAMUSCULAR | Status: AC
  Administered 2024-08-20: 20:00:00 10 mg via INTRAMUSCULAR

## 2024-08-20 MED ORDER — FLUTICASONE PROPIONATE 50 MCG/ACT NA SUSP: 2.0000 | Freq: Every day | NASAL | 2 refills | Status: AC

## 2024-08-20 MED ORDER — KETOROLAC TROMETHAMINE 30 MG/ML IJ SOLN
30.0000 mg | Freq: Once | INTRAMUSCULAR | Status: AC
  Administered 2024-08-20: 20:00:00 30 mg via INTRAMUSCULAR

## 2024-08-20 MED ORDER — KETOROLAC TROMETHAMINE 30 MG/ML IJ SOLN
INTRAMUSCULAR | Status: AC
  Filled 2024-08-20: qty 1

## 2024-08-20 MED ORDER — AMOXICILLIN-POT CLAVULANATE 875-125 MG PO TABS: 1.0000 | ORAL_TABLET | Freq: Two times a day (BID) | ORAL | 0 refills | Status: AC

## 2024-08-20 NOTE — ED Triage Notes (Signed)
 Patient's brother translating.  Pt had headache, cough, congestion, fevers for 4 days. Reports had vomiting after eating. Denies urinary or bowel problems. Taking Tylenol .
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
## 2024-08-20 NOTE — Discharge Instructions (Addendum)
 Flu A, flu B and COVID testing done today is negative. Symptoms and physical exam findings are consistent with an acute frontal sinus infection.  Due to the severity of symptoms and duration we will treat with antibiotics by mouth.  We will treat the headache with injections today including a medication for pain as well as a steroid.  We recommend the following:   Toradol  injection given today. This is a medication to help with pain. This is not a narcotic.  Decadron  injection given today. This is a steroid to help with inflammation and pain. Augmentin  875 mg twice daily for 7 days.  This is an antibiotic.  Take this with food.  May alternate Tylenol  with ibuprofen  for pain or fevers Flonase  2 sprays each nostril once daily for 7 days for nasal congestion.  May use as needed after this.  Make sure to stay hydrated by drinking plenty of water.  Return to urgent care or PCP if symptoms worsen or fail to resolve.

## 2024-11-14 ENCOUNTER — Ambulatory Visit: Payer: Self-pay | Admitting: Nurse Practitioner
